# Patient Record
Sex: Male | Born: 1965 | Race: White | Hispanic: No | Marital: Single | Smoking: Former smoker
Health system: Southern US, Community
[De-identification: ages and names within clinical notes are randomized; demographics above are authoritative.]

## PROBLEM LIST (undated history)

## (undated) DIAGNOSIS — R569 Unspecified convulsions: Secondary | ICD-10-CM

## (undated) DIAGNOSIS — C801 Malignant (primary) neoplasm, unspecified: Secondary | ICD-10-CM

## (undated) HISTORY — PX: OTHER SURGICAL HISTORY: SHX169

---

## 2006-04-10 ENCOUNTER — Emergency Department: Payer: Self-pay | Admitting: Emergency Medicine

## 2017-06-02 DIAGNOSIS — Z01818 Encounter for other preprocedural examination: Secondary | ICD-10-CM

## 2018-05-25 DIAGNOSIS — Z01818 Encounter for other preprocedural examination: Secondary | ICD-10-CM

## 2020-04-17 ENCOUNTER — Emergency Department (HOSPITAL_COMMUNITY): Payer: Medicaid Other

## 2020-04-17 ENCOUNTER — Other Ambulatory Visit: Payer: Self-pay

## 2020-04-17 ENCOUNTER — Inpatient Hospital Stay (HOSPITAL_COMMUNITY)
Admission: EM | Admit: 2020-04-17 | Discharge: 2020-05-02 | DRG: 004 | Disposition: A | Discharge status: Home Health | Payer: Medicaid Other | Attending: Internal Medicine | Admitting: Internal Medicine

## 2020-04-17 ENCOUNTER — Inpatient Hospital Stay (HOSPITAL_COMMUNITY): Payer: Medicaid Other

## 2020-04-17 DIAGNOSIS — R634 Abnormal weight loss: Secondary | ICD-10-CM | POA: Diagnosis present

## 2020-04-17 DIAGNOSIS — R1310 Dysphagia, unspecified: Secondary | ICD-10-CM | POA: Diagnosis present

## 2020-04-17 DIAGNOSIS — Z930 Tracheostomy status: Secondary | ICD-10-CM

## 2020-04-17 DIAGNOSIS — J9601 Acute respiratory failure with hypoxia: Secondary | ICD-10-CM | POA: Diagnosis not present

## 2020-04-17 DIAGNOSIS — E46 Unspecified protein-calorie malnutrition: Secondary | ICD-10-CM | POA: Diagnosis not present

## 2020-04-17 DIAGNOSIS — F17200 Nicotine dependence, unspecified, uncomplicated: Secondary | ICD-10-CM | POA: Diagnosis present

## 2020-04-17 DIAGNOSIS — J387 Other diseases of larynx: Secondary | ICD-10-CM

## 2020-04-17 DIAGNOSIS — C329 Malignant neoplasm of larynx, unspecified: Secondary | ICD-10-CM | POA: Diagnosis present

## 2020-04-17 DIAGNOSIS — G9341 Metabolic encephalopathy: Secondary | ICD-10-CM | POA: Diagnosis present

## 2020-04-17 DIAGNOSIS — Z681 Body mass index (BMI) 19 or less, adult: Secondary | ICD-10-CM | POA: Diagnosis not present

## 2020-04-17 DIAGNOSIS — A419 Sepsis, unspecified organism: Principal | ICD-10-CM | POA: Diagnosis present

## 2020-04-17 DIAGNOSIS — F1010 Alcohol abuse, uncomplicated: Secondary | ICD-10-CM | POA: Diagnosis present

## 2020-04-17 DIAGNOSIS — R109 Unspecified abdominal pain: Secondary | ICD-10-CM

## 2020-04-17 DIAGNOSIS — K709 Alcoholic liver disease, unspecified: Secondary | ICD-10-CM | POA: Diagnosis present

## 2020-04-17 DIAGNOSIS — J189 Pneumonia, unspecified organism: Secondary | ICD-10-CM | POA: Diagnosis present

## 2020-04-17 DIAGNOSIS — E860 Dehydration: Secondary | ICD-10-CM | POA: Diagnosis present

## 2020-04-17 DIAGNOSIS — E871 Hypo-osmolality and hyponatremia: Secondary | ICD-10-CM | POA: Diagnosis present

## 2020-04-17 DIAGNOSIS — E8809 Other disorders of plasma-protein metabolism, not elsewhere classified: Secondary | ICD-10-CM | POA: Diagnosis present

## 2020-04-17 DIAGNOSIS — Z23 Encounter for immunization: Secondary | ICD-10-CM

## 2020-04-17 DIAGNOSIS — E876 Hypokalemia: Secondary | ICD-10-CM | POA: Diagnosis present

## 2020-04-17 DIAGNOSIS — E43 Unspecified severe protein-calorie malnutrition: Secondary | ICD-10-CM | POA: Diagnosis present

## 2020-04-17 DIAGNOSIS — K7030 Alcoholic cirrhosis of liver without ascites: Secondary | ICD-10-CM | POA: Diagnosis not present

## 2020-04-17 DIAGNOSIS — Z720 Tobacco use: Secondary | ICD-10-CM | POA: Diagnosis not present

## 2020-04-17 DIAGNOSIS — R6521 Severe sepsis with septic shock: Secondary | ICD-10-CM

## 2020-04-17 DIAGNOSIS — R64 Cachexia: Secondary | ICD-10-CM | POA: Diagnosis present

## 2020-04-17 DIAGNOSIS — Z20822 Contact with and (suspected) exposure to covid-19: Secondary | ICD-10-CM | POA: Diagnosis present

## 2020-04-17 DIAGNOSIS — J690 Pneumonitis due to inhalation of food and vomit: Secondary | ICD-10-CM | POA: Diagnosis present

## 2020-04-17 DIAGNOSIS — N179 Acute kidney failure, unspecified: Secondary | ICD-10-CM | POA: Diagnosis present

## 2020-04-17 HISTORY — DX: Unspecified convulsions: R56.9

## 2020-04-17 LAB — COMPREHENSIVE METABOLIC PANEL
ALT: 19 U/L (ref 0–44)
AST: 33 U/L (ref 15–41)
Albumin: 2.1 g/dL — ABNORMAL LOW (ref 3.5–5.0)
Alkaline Phosphatase: 73 U/L (ref 38–126)
Anion gap: 15 (ref 5–15)
BUN: 79 mg/dL — ABNORMAL HIGH (ref 6–20)
CO2: 31 mmol/L (ref 22–32)
Calcium: 7.5 mg/dL — ABNORMAL LOW (ref 8.9–10.3)
Chloride: 82 mmol/L — ABNORMAL LOW (ref 98–111)
Creatinine, Ser: 1.99 mg/dL — ABNORMAL HIGH (ref 0.61–1.24)
GFR, Estimated: 39 mL/min — ABNORMAL LOW (ref >60)
Glucose, Bld: 105 mg/dL — ABNORMAL HIGH (ref 70–99)
Potassium: 2.1 mmol/L — CL (ref 3.5–5.1)
Sodium: 128 mmol/L — ABNORMAL LOW (ref 135–145)
Total Bilirubin: 1.8 mg/dL — ABNORMAL HIGH (ref 0.3–1.2)
Total Protein: 6.1 g/dL — ABNORMAL LOW (ref 6.5–8.1)

## 2020-04-17 LAB — LACTIC ACID, PLASMA
Lactic Acid, Venous: 5 mmol/L (ref 0.5–1.9)
Lactic Acid, Venous: 3.4 mmol/L (ref 0.5–1.9)

## 2020-04-17 LAB — URINALYSIS, ROUTINE W REFLEX MICROSCOPIC
Bacteria, UA: NONE SEEN
Bilirubin Urine: NEGATIVE
Glucose, UA: NEGATIVE mg/dL
Ketones, ur: NEGATIVE mg/dL
Leukocytes,Ua: NEGATIVE
Nitrite: NEGATIVE
Protein, ur: NEGATIVE mg/dL
Specific Gravity, Urine: 1.014 (ref 1.005–1.030)
pH: 5 (ref 5.0–8.0)

## 2020-04-17 LAB — CBC WITH DIFFERENTIAL/PLATELET
Abs Immature Granulocytes: 0 10*3/uL (ref 0.00–0.07)
Band Neutrophils: 0 %
Basophils Absolute: 0 10*3/uL (ref 0.0–0.1)
Basophils Relative: 0 %
Blasts: 0 %
Eosinophils Absolute: 0.5 10*3/uL (ref 0.0–0.5)
Eosinophils Relative: 3 %
HCT: 46.5 % (ref 39.0–52.0)
Hemoglobin: 16.5 g/dL (ref 13.0–17.0)
Lymphocytes Relative: 3 %
Lymphs Abs: 0.5 10*3/uL — ABNORMAL LOW (ref 0.7–4.0)
MCH: 30.6 pg (ref 26.0–34.0)
MCHC: 35.5 g/dL (ref 30.0–36.0)
MCV: 86.3 fL (ref 80.0–100.0)
Metamyelocytes Relative: 0 %
Monocytes Absolute: 0.7 10*3/uL (ref 0.1–1.0)
Monocytes Relative: 4 %
Myelocytes: 0 %
Neutro Abs: 15.8 10*3/uL — ABNORMAL HIGH (ref 1.7–7.7)
Neutrophils Relative %: 90 %
Other: 0 %
Platelets: 244 10*3/uL (ref 150–400)
Promyelocytes Relative: 0 %
RBC: 5.39 MIL/uL (ref 4.22–5.81)
RDW: 11.5 % (ref 11.5–15.5)
WBC: 17.5 10*3/uL — ABNORMAL HIGH (ref 4.0–10.5)
nRBC: 0 % (ref 0.0–0.2)
nRBC: 0 /100 WBC

## 2020-04-17 LAB — PROTIME-INR
INR: 1.4 — ABNORMAL HIGH (ref 0.8–1.2)
Prothrombin Time: 16.4 seconds — ABNORMAL HIGH (ref 11.4–15.2)

## 2020-04-17 LAB — APTT: aPTT: 30 seconds (ref 24–36)

## 2020-04-17 LAB — RESP PANEL BY RT-PCR (FLU A&B, COVID) ARPGX2
Influenza A by PCR: NEGATIVE
Influenza B by PCR: NEGATIVE
SARS Coronavirus 2 by RT PCR: NEGATIVE

## 2020-04-17 LAB — TROPONIN I (HIGH SENSITIVITY)
Troponin I (High Sensitivity): 16 ng/L (ref <18)
Troponin I (High Sensitivity): 14 ng/L (ref <18)

## 2020-04-17 LAB — POC OCCULT BLOOD, ED: Fecal Occult Bld: NEGATIVE

## 2020-04-17 LAB — MAGNESIUM: Magnesium: 2.1 mg/dL (ref 1.7–2.4)

## 2020-04-17 MED ORDER — POTASSIUM CHLORIDE 10 MEQ/100ML IV SOLN
10.0000 meq | INTRAVENOUS | Status: AC
  Administered 2020-04-17 – 2020-04-18 (×4): 10 meq via INTRAVENOUS
  Filled 2020-04-17 (×5): qty 100

## 2020-04-17 MED ORDER — SODIUM CHLORIDE 0.9 % IV SOLN
2.0000 g | Freq: Two times a day (BID) | INTRAVENOUS | Status: DC
  Administered 2020-04-18: 2 g via INTRAVENOUS
  Filled 2020-04-17: qty 2

## 2020-04-17 MED ORDER — POTASSIUM CHLORIDE 10 MEQ/100ML IV SOLN
10.0000 meq | INTRAVENOUS | Status: AC
  Administered 2020-04-18 (×2): 10 meq via INTRAVENOUS
  Filled 2020-04-17: qty 100

## 2020-04-17 MED ORDER — POTASSIUM CHLORIDE CRYS ER 20 MEQ PO TBCR
40.0000 meq | EXTENDED_RELEASE_TABLET | Freq: Once | ORAL | Status: AC
  Administered 2020-04-17: 40 meq via ORAL
  Filled 2020-04-17: qty 2

## 2020-04-17 MED ORDER — SODIUM CHLORIDE 0.9 % IV SOLN
500.0000 mg | INTRAVENOUS | Status: DC
  Administered 2020-04-17 – 2020-04-21 (×5): 500 mg via INTRAVENOUS
  Filled 2020-04-17 (×7): qty 500

## 2020-04-17 MED ORDER — SODIUM CHLORIDE 0.9 % IV SOLN
2.0000 g | Freq: Once | INTRAVENOUS | Status: AC
  Administered 2020-04-17: 2 g via INTRAVENOUS
  Filled 2020-04-17: qty 2

## 2020-04-17 MED ORDER — VANCOMYCIN HCL 1000 MG/200ML IV SOLN
1000.0000 mg | Freq: Once | INTRAVENOUS | Status: AC
Start: 2020-04-17 — End: 2020-04-17
  Administered 2020-04-17: 1000 mg via INTRAVENOUS
  Filled 2020-04-17: qty 200

## 2020-04-17 MED ORDER — METRONIDAZOLE IN NACL 5-0.79 MG/ML-% IV SOLN
500.0000 mg | Freq: Once | INTRAVENOUS | Status: AC
  Administered 2020-04-17: 500 mg via INTRAVENOUS
  Filled 2020-04-17: qty 100

## 2020-04-17 MED ORDER — VANCOMYCIN VARIABLE DOSE PER UNSTABLE RENAL FUNCTION (PHARMACIST DOSING): Status: DC

## 2020-04-17 MED ORDER — THIAMINE HCL 100 MG/ML IJ SOLN
100.0000 mg | Freq: Once | INTRAMUSCULAR | Status: AC
  Administered 2020-04-17: 100 mg via INTRAVENOUS
  Filled 2020-04-17: qty 2

## 2020-04-17 MED ORDER — LACTATED RINGERS IV SOLN: INTRAVENOUS | Status: DC

## 2020-04-17 NOTE — ED Notes (Signed)
Gwyndolyn Saxon PA also was informed lab was unable to obtain second set of blood cultures. This Rn will try again.

## 2020-04-17 NOTE — ED Notes (Signed)
Verbal order from [place] PA to infuse 1L of LR wide open. Current bag of LR that is infusing: rate increased to 999 ml/hr

## 2020-04-17 NOTE — ED Notes (Signed)
#  2 L NS Boluses for very low bp.

## 2020-04-17 NOTE — ED Provider Notes (Signed)
[facility] EMERGENCY DEPARTMENT Provider Note   CSN: [id number] Arrival date & time: 04/17/20  1828     History Chief Complaint  Patient presents with  . Multiple-see triage note    Mario Peters is a 55 y.o. male.  HPI   Patient with no significant medical history presents via EMS due to inability to get out of bed, weight loss, and productive cough x4 months.  Patient endorses that he stopped drinking proxy 4 months ago as he has been unable to swallow without pain.  He endorses every time he swallows food or drinks liquid he has a burning sensation in his throat.  Patient patient has a history of alcohol abuse as well as smoking history and had to stop drinking due to the pain in his throat.  He denies any alleviating factors.  Patient admits to frequent coughs over the last 4 months but has progressed gotten worse, he admits to productive sputum, fevers and chills, without chest pain or shortness of breath.  Patient states that he has been stuck in his bed for few days as he feels too weak to get out of out, he endorses pain in his legs feels like electrical shock down his legs, he denies recent trauma to the area, paresthesias or weakness in them.   No past medical history on file.  There are no problems to display for this patient.       No family history on file.     Home Medications Prior to Admission medications   Not on File    Allergies    Patient has no known allergies.  Review of Systems   Review of Systems  Constitutional: Positive for chills and fever.  HENT: Positive for sore throat and trouble swallowing. Negative for congestion.   Eyes: Negative for visual disturbance.  Respiratory: Positive for cough. Negative for shortness of breath.   Cardiovascular: Negative for chest pain.  Gastrointestinal: Positive for abdominal pain and constipation. Negative for diarrhea and vomiting.  Genitourinary: Negative for enuresis and flank  pain.  Musculoskeletal: Negative for back pain.  Skin: Negative for rash.  Neurological: Positive for weakness. Negative for dizziness and headaches.  Hematological: Does not bruise/bleed easily.    Physical Exam Updated Vital Signs BP (!) 150/79 (BP Location: Right Arm)   Pulse 72   Temp 100 F (37.8 C) (Rectal)   Resp 13   Ht 5\' 7"  (1.702 m)   Wt 65.8 kg   SpO2 92%   BMI 22.71 kg/m   Physical Exam Vitals and nursing note reviewed.  Constitutional:      General: He is not in acute distress.    Appearance: He is ill-appearing and toxic-appearing.     Comments: Patient is noted to be in a deconditioned and emaciated, febrile, tachycardic, hypotensive.  HENT:     Head: Normocephalic and atraumatic.     Nose: No congestion.     Mouth/Throat:     Mouth: Mucous membranes are dry.     Pharynx: Oropharynx is clear. No oropharyngeal exudate or posterior oropharyngeal erythema.  Eyes:     Extraocular Movements: Extraocular movements intact.     Conjunctiva/sclera: Conjunctivae normal.     Pupils: Pupils are equal, round, and reactive to light.  Cardiovascular:     Rate and Rhythm: Regular rhythm. Tachycardia present.     Pulses: Normal pulses.     Heart sounds: No murmur heard. No friction rub. No gallop.   Pulmonary:  Effort: No respiratory distress.     Breath sounds: No wheezing, rhonchi or rales.     Comments: Patient is a nasal cannula at 3 L, satting in the low 90s, patient has noted rhonchi and decreased breath sounds in the lower lobes bilaterally, no Rales or wheezing noted Abdominal:     Palpations: Abdomen is soft.     Tenderness: There is no abdominal tenderness. There is no right CVA tenderness or left CVA tenderness.  Musculoskeletal:     Right lower leg: No edema.     Left lower leg: No edema.  Skin:    General: Skin is warm and dry.  Neurological:     Mental Status: He is alert.  Psychiatric:        Mood and Affect: Mood normal.     ED Results /  Procedures / Treatments   Labs (all labs ordered are listed, but only abnormal results are displayed) Labs Reviewed  LACTIC ACID, PLASMA - Abnormal; Notable for the following components:      Result Value   Lactic Acid, Venous 5.0 (*)    All other components within normal limits  COMPREHENSIVE METABOLIC PANEL - Abnormal; Notable for the following components:   Sodium 128 (*)    Potassium 2.1 (*)    Chloride 82 (*)    Glucose, Bld 105 (*)    BUN 79 (*)    Creatinine, Ser 1.99 (*)    Calcium 7.5 (*)    Total Protein 6.1 (*)    Albumin 2.1 (*)    Total Bilirubin 1.8 (*)    GFR, Estimated 39 (*)    All other components within normal limits  CBC WITH DIFFERENTIAL/PLATELET - Abnormal; Notable for the following components:   WBC 17.5 (*)    Neutro Abs 15.8 (*)    Lymphs Abs 0.5 (*)    All other components within normal limits  PROTIME-INR - Abnormal; Notable for the following components:   Prothrombin Time 16.4 (*)    INR 1.4 (*)    All other components within normal limits  URINALYSIS, ROUTINE W REFLEX MICROSCOPIC - Abnormal; Notable for the following components:   Color, Urine AMBER (*)    APPearance CLOUDY (*)    Hgb urine dipstick SMALL (*)    All other components within normal limits  RESP PANEL BY RT-PCR (FLU A&B, COVID) ARPGX2  CULTURE, BLOOD (ROUTINE X 2)  CULTURE, BLOOD (ROUTINE X 2)  URINE CULTURE  APTT  MAGNESIUM  LACTIC ACID, PLASMA  POC OCCULT BLOOD, ED  I-STAT CHEM 8, ED  TROPONIN I (HIGH SENSITIVITY)  TROPONIN I (HIGH SENSITIVITY)    EKG None  Radiology DG Chest Port 1 View  Result Date: 04/17/2020 CLINICAL DATA:  Weight loss cough EXAM: PORTABLE CHEST 1 VIEW COMPARISON:  None. FINDINGS: Hyperinflation. Ill-defined left greater than right reticulonodular and ground-glass opacity. No pleural effusion. Normal heart size. No pneumothorax. IMPRESSION: Ill-defined left greater than right reticulonodular and ground-glass opacity, suspicious for bilateral  pneumonia, potentially atypical or viral pneumonia. Electronically Signed   By: Donavan Foil M.D.   On: 04/17/2020 19:14    Procedures .Critical Care Performed by: Marcello Fennel, PA-C Authorized by: Marcello Fennel, PA-C   Critical care provider statement:    Critical care time (minutes):  45   Critical care time was exclusive of:  Separately billable procedures and treating other patients   Critical care was necessary to treat or prevent imminent or life-threatening deterioration of the following conditions:  Sepsis and metabolic crisis   Critical care was time spent personally by me on the following activities:  Discussions with consultants, evaluation of patient's response to treatment, examination of patient, ordering and performing treatments and interventions, ordering and review of laboratory studies, ordering and review of radiographic studies, pulse oximetry, re-evaluation of patient's condition, obtaining history from patient or surrogate and review of old charts   I assumed direction of critical care for this patient from another provider in my specialty: no       Medications Ordered in ED Medications  lactated ringers infusion ( Intravenous New Bag/Given 04/17/20 2233)  vancomycin (VANCOREADY) IVPB 1000 mg/200 mL (1,000 mg Intravenous New Bag/Given 04/17/20 2210)  potassium chloride 10 mEq in 100 mL IVPB (10 mEq Intravenous New Bag/Given 04/17/20 2220)  vancomycin variable dose per unstable renal function (pharmacist dosing) (has no administration in time range)  ceFEPIme (MAXIPIME) 2 g in sodium chloride 0.9 % 100 mL IVPB (has no administration in time range)  ceFEPIme (MAXIPIME) 2 g in sodium chloride 0.9 % 100 mL IVPB (0 g Intravenous Stopped 04/17/20 2101)  metroNIDAZOLE (FLAGYL) IVPB 500 mg (0 mg Intravenous Stopped 04/17/20 2209)  potassium chloride SA (KLOR-CON) CR tablet 40 mEq (40 mEq Oral Given 04/17/20 2221)  thiamine (B-1) injection 100 mg (100 mg Intravenous  Given 04/17/20 2239)    ED Course  I have reviewed the triage vital signs and the nursing notes.  Pertinent labs & imaging results that were available during my care of the patient were reviewed by me and considered in my medical decision making (see chart for details).    MDM Rules/Calculators/A&P                         Initial impression-patient presents with fatigue, cough, weight loss.  Appear to be emaciated, but alert, vital signs notable for febrile, tachycardic, hypotensive, currently on 3 L via nasal cannula.  Concern for sepsis we will start sepsis lab work-up, start him on fluids, antibiotics, and reassess.  Work-up-CBC notable for leukocytosis 17.5, CMP shows hyponatremia of 128, hyperkalemia 2.1, creatinine 1.99, T bili 1.8.  Prothrombin time 16.4, INR 1.4, lactic 5.  UA shows negative nitrites or leukocytes.  Chest x-ray shows ill-defined left greater than right reticulonodular node growth glass opacities source for bilateral pneumonia  Reassessment patient reassessed after providing him with fluids, blood pressure has improved, systolic in the 518A, heart rate has normalized in the mid 80s to 90s, currently not requiring oxygen at this time.  Patient is noted to have low potassium suspect  secondary due to malnutrition will start him on intravenous potassium was oral supplements.   Rule out-I have low suspicion for meningitis as there is no meningeal signs on my exam, low suspicion for CVA or intracranial head bleed as patient is no neuro deficits, patient is grossly moving all 4 extremities without difficulty.  Low suspicion for ACS as patient denies chest pain, shortness of breath, EKG negative for signs of ischemia.  Low suspicion for intra-abdominal abnormality patient denies abdominal pain, abdomen soft nontender palpation.   Plan-due to shift change patient will be handed off to Dr. Ralene Bathe she was provided HPI, current work-up, likely disposition.  Patient will be admitted to  medicine due to sepsis secondary to bilateral pneumonia.  Suspect patient's hypokalemia secondary to malnutrition and chronic alcohol abuse.    Final Clinical Impression(s) / ED Diagnoses Final diagnoses:  Sepsis, due to unspecified organism, unspecified whether  acute organ dysfunction present ([place])  Hypokalemia  AKI (acute kidney injury) [facility])    Rx / DC Orders ED Discharge Orders    None       Aron Baba 04/17/20 2246    Quintella Reichert, MD 04/23/20 0110

## 2020-04-17 NOTE — ED Notes (Signed)
Lab unable to draw second set of blood cultures.

## 2020-04-17 NOTE — Progress Notes (Signed)
Pharmacy Antibiotic Note  Mario Peters is a 55 y.o. male admitted on 04/17/2020 with sepsis.  Pharmacy has been consulted for vancomycin and cefepime dosing.  Plan: Vancomycin 1000 mg IV x1, then variable dosing due to unstable renal function Cefepime 2 g IV every 12 hours Monitor renal function, Cx and clinical progression to narrow Vancomycin levels as indicated  Height: 5\' 7"  (170.2 cm) Weight: 65.8 kg (145 lb) IBW/kg (Calculated) : 66.1  Temp (24hrs), Avg:98.8 F (37.1 C), Min:97.6 F (36.4 C), Max:100 F (37.8 C)  Recent Labs  Lab 04/17/20 1936  WBC 17.5*  CREATININE 1.99*  LATICACIDVEN 5.0*    Estimated Creatinine Clearance: 39.5 mL/min (A) (by C-G formula based on SCr of 1.99 mg/dL (H)).    No Known Allergies  Antimicrobials this admission: Vancomycin 3/30> Cefepime 3/30>  Dose adjustments this admission: N/A  Microbiology results: 3/30 Bcx: 3/30 Ucx:   Thank you for allowing pharmacy to be a part of this patient's care.  Bertis Ruddy, PharmD Clinical Pharmacist ED Pharmacist Phone # [phone] 04/17/2020 9:50 PM

## 2020-04-17 NOTE — ED Notes (Signed)
Second set of blood cultures obtained by this RN prior to Vancomycin being infused but cefepime and flagyl had already been infused

## 2020-04-17 NOTE — ED Notes (Signed)
K -2.1 and Lactic acid -5: Gwyndolyn Saxon, [place] informed

## 2020-04-17 NOTE — Sepsis Progress Note (Signed)
Monitoring for code sepsis protocol. 

## 2020-04-17 NOTE — ED Triage Notes (Addendum)
Pt bib ems from home reportedly has not been out of bed today. Reports abruptly stopped drinking alcohol "cold [place]" 4 mths ago. C/O weight loss, constipation, productive cough, dry mouth, dehydration, generalized pain. cbg [address] ST 130-105 rr 30, capnography 26

## 2020-04-17 NOTE — ED Notes (Signed)
Report given to Jade,RN.

## 2020-04-17 NOTE — H&P (Signed)
Mario Peters EXB:[id number] DOB: 09-22-1965 DOA: 04/17/2020     PCP: Patient, [name]   Outpatient Specialists: NONE    Patient arrived to ER on 04/17/20 at [facility] Referred by Attending Quintella Reichert, MD   Patient coming from: home Lives   With family    Chief Complaint:   Chief Complaint  Patient presents with  . Multiple-see triage note    HPI: Mario Peters is a 55 y.o. male with medical history significant of EtOH abuse,  Tobacco abuse, remote hx of seizures    Presented with  Quit drinking 4 months ago have had sever dysphasia, cough fatigue unable to get out of bed today and called 911  Patient continues to smoke, reports have had significant weight loss and has burning sensation sometimes when he swallows reports hoarseness of his voice has been chronic. Also states 3 to 4 years ago he had a seizure and was given a dose of Dilantin and he thought it made his seizure worse so he stopped taking it and he have not had seizures since. He has not had any chest pain but does have some significant shortness of breath   Infectious risk factors:  Reports shortness of breath,    Has  been vaccinated against COVID x1   Initial COVID TEST  NEGATIVE    Lab Results  Component Value Date   [place] NEGATIVE 04/17/2020     While in ER: Noted to be initially hypotensive given aggressive IV fluid rehydration if improved blood pressure was noted to have elevated lactic acid and chest x-ray worrisome for pneumonia started on broad-spectrum antibiotics and being admitted for sepsis secondary to pneumonia     ED Triage Vitals  Enc Vitals Group     BP 04/17/20 1829 (!) 66/58     Pulse Rate 04/17/20 1829 (!) 101     Resp 04/17/20 1829 (!) 24     Temp 04/17/20 1829 97.6 F (36.4 C)     Temp Source 04/17/20 1829 Oral     SpO2 04/17/20 1829 93 %     Weight 04/17/20 2132 145 lb (65.8 kg)     Height 04/17/20 1856 _0  (1.702 m)     Head Circumference --       Peak Flow --      Pain Score 04/17/20 1850 10     Pain Loc --      Pain Edu? --      Excl. in [place]? --   TMAX(24)@     _________________________________________ Significant initial  Findings: Abnormal Labs Reviewed  LACTIC ACID, PLASMA - Abnormal; Notable for the following components:      Result Value   Lactic Acid, Venous 5.0 (*)    All other components within normal limits  LACTIC ACID, PLASMA - Abnormal; Notable for the following components:   Lactic Acid, Venous 3.4 (*)    All other components within normal limits  COMPREHENSIVE METABOLIC PANEL - Abnormal; Notable for the following components:   Sodium 128 (*)    Potassium 2.1 (*)    Chloride 82 (*)    Glucose, Bld 105 (*)    BUN 79 (*)    Creatinine, Ser 1.99 (*)    Calcium 7.5 (*)    Total Protein 6.1 (*)    Albumin 2.1 (*)    Total Bilirubin 1.8 (*)    GFR, Estimated 39 (*)    All other components within normal limits  CBC WITH DIFFERENTIAL/PLATELET -  Abnormal; Notable for the following components:   WBC 17.5 (*)    Neutro Abs 15.8 (*)    Lymphs Abs 0.5 (*)    All other components within normal limits  PROTIME-INR - Abnormal; Notable for the following components:   Prothrombin Time 16.4 (*)    INR 1.4 (*)    All other components within normal limits  URINALYSIS, ROUTINE W REFLEX MICROSCOPIC - Abnormal; Notable for the following components:   Color, Urine AMBER (*)    APPearance CLOUDY (*)    Hgb urine dipstick SMALL (*)    All other components within normal limits  I-STAT BETA HCG BLOOD, ED ([facility], [facility], [facility] ONLY) - Abnormal; Notable for the following components:   I-stat hCG, quantitative 6.1 (*)    All other components within normal limits   ____________________________________________ Ordered   CXR - Ill-defined left greater than right reticulonodular and ground-glass opacity, suspicious for bilateral pneumonia, potentially atypical or viral pneumonia.     CT  chest - evidence of infiltrate question  aspiration  CT neck showing soft tissue mass worrisome for laryngeal carcinoma   _________________________ Troponin 16 ECG: Ordered Personally reviewed by me showing: HR : 89 Rhythm:   NSR,    nonspecific changes,  QTC 594 ____________________ This patient meets SIRS Criteria     The recent clinical data is shown below. Vitals:   04/17/20 2132 04/17/20 2145 04/17/20 2200 04/17/20 2215  BP:  103/76 (!) 88/70 (!) 150/79  Pulse:  78 76 72  Resp:  17 (!) 33 13  Temp:      TempSrc:      SpO2:  96% 96% 92%  Weight: 65.8 kg     Height: $Remove'5\' 7"'[id number]$  (1.702 m)        WBC     Component Value Date/Time   WBC 17.5 (H) 04/17/2020 1936   LYMPHSABS 0.5 (L) 04/17/2020 1936   MONOABS 0.7 04/17/2020 1936   EOSABS 0.5 04/17/2020 1936   BASOSABS 0.0 04/17/2020 1936    Lactic Acid, Venous    Component Value Date/Time   LATICACIDVEN 5.0 (HH) 04/17/2020 1936    Procalcitonin  Ordered Lactic Acid, Venous    Component Value Date/Time   LATICACIDVEN 3.4 (HH) 04/17/2020 2206      UA   no evidence of UTI      Urine analysis:    Component Value Date/Time   COLORURINE AMBER (A) 04/17/2020 2133   APPEARANCEUR CLOUDY (A) 04/17/2020 2133   LABSPEC 1.014 04/17/2020 2133   PHURINE 5.0 04/17/2020 2133   GLUCOSEU NEGATIVE 04/17/2020 2133   HGBUR SMALL (A) 04/17/2020 2133   BILIRUBINUR NEGATIVE 04/17/2020 2133   [place] NEGATIVE 04/17/2020 2133   PROTEINUR NEGATIVE 04/17/2020 2133   NITRITE NEGATIVE 04/17/2020 2133   LEUKOCYTESUR NEGATIVE 04/17/2020 2133     _______________________________________________ Hospitalist was called for admission for CAP and sepsis with new laryngeal mass  The following Work up has been ordered so far:  Orders Placed This Encounter  Procedures  . Critical Care  . Resp Panel by RT-PCR (Flu A&B, Covid) Nasopharyngeal Swab  . Blood Culture (routine x 2)  . Urine culture  . DG Chest Port 1 View  . Lactic acid, plasma  . Comprehensive metabolic panel  .  CBC WITH DIFFERENTIAL  . Protime-INR  . APTT  . Urinalysis, Routine w reflex microscopic  . Magnesium  . Diet NPO time specified  . Cardiac monitoring  . Document height and weight  . Assess and Document  Glasgow Coma Scale  . Document vital signs within 1-hour of fluid bolus completion. Notify provider of abnormal vital signs despite fluid resuscitation.  . DO NOT delay antibiotics if unable to obtain blood culture.  . Refer to Sidebar Report: Sepsis Sidebar ED/IP  . Notify provider for difficulties obtaining IV access.  . Insert peripheral IV x 2  . Initiate Carrier Fluid Protocol  . Code Sepsis activation.  This occurs automatically when order is signed and prioritizes pharmacy, lab, and radiology services for STAT collections and interventions.  If [facility] downtime, call [facility] [phone]) to activate Code Sepsis.  Marland Kitchen ceFEPime (MAXIPIME) per pharmacy consult  . vancomycin per pharmacy consult  . Consult for [facility] Admission  . Pulse oximetry, continuous  . POC occult blood, ED  . I-stat chem 8, ED (not at [facility] or [facility])  . ED EKG 12-Lead  . EKG 12-Lead    Following Medications were ordered in ER: Medications  lactated ringers infusion ( Intravenous New Bag/Given 04/17/20 2233)  vancomycin (VANCOREADY) IVPB 1000 mg/200 mL (1,000 mg Intravenous New Bag/Given 04/17/20 2210)  potassium chloride 10 mEq in 100 mL IVPB (10 mEq Intravenous New Bag/Given 04/17/20 2220)  vancomycin variable dose per unstable renal function (pharmacist dosing) (has no administration in time range)  ceFEPIme (MAXIPIME) 2 g in sodium chloride 0.9 % 100 mL IVPB (has no administration in time range)  ceFEPIme (MAXIPIME) 2 g in sodium chloride 0.9 % 100 mL IVPB (0 g Intravenous Stopped 04/17/20 2101)  metroNIDAZOLE (FLAGYL) IVPB 500 mg (0 mg Intravenous Stopped 04/17/20 2209)  potassium chloride SA (KLOR-CON) CR tablet 40 mEq (40 mEq Oral Given 04/17/20 2221)  thiamine (B-1) injection 100 mg (100  mg Intravenous Given 04/17/20 2239)      OTHER Significant initial  Findings:  labs showing:    Recent Labs  Lab 04/17/20 1936  NA 128*  K 2.1*  CO2 31  GLUCOSE 105*  BUN 79*  CREATININE 1.99*  CALCIUM 7.5*  MG 2.1    Cr   ,  Up from baseline see below Lab Results  Component Value Date   CREATININE 1.99 (H) 04/17/2020    Recent Labs  Lab 04/17/20 1936  AST 33  ALT 19  ALKPHOS 73  BILITOT 1.8*  PROT 6.1*  ALBUMIN 2.1*   Lab Results  Component Value Date   CALCIUM 7.5 (L) 04/17/2020          Plt: Lab Results  Component Value Date   PLT 244 04/17/2020       Recent Labs  Lab 04/17/20 1936  WBC 17.5*  NEUTROABS 15.8*  HGB 16.5  HCT 46.5  MCV 86.3  PLT 244    HG/HCT  Stable,     Component Value Date/Time   HGB 16.5 04/17/2020 1936   HCT 46.5 04/17/2020 1936   MCV 86.3 04/17/2020 1936    No results for input(s): LIPASE, AMYLASE in the last 168 hours. Recent Labs  Lab 04/17/20 2357  AMMONIA 25     Cardiac Panel (last 3 results) No results for input(s): CKTOTAL, CKMB, TROPONINI, RELINDX in the last 72 hours.           Cultures: No results found for: SDES, SPECREQUEST, CULT, REPTSTATUS   Radiological Exams on Admission: CT SOFT TISSUE NECK WO CONTRAST  Result Date: 04/18/2020 CLINICAL DATA:  Fatigue, cough and weakness EXAM: CT NECK WITHOUT CONTRAST TECHNIQUE: Multidetector CT imaging of the neck was performed following the standard protocol without intravenous contrast. COMPARISON:  None. FINDINGS: Pharynx and larynx: There is a mildly hyperdense focus at the posterior aspect of the left vocal fold with associated soft tissue thickening (3:95). The epiglottis is normal. Pharynx is normal. Salivary glands: No inflammation, mass, or stone. Thyroid: Normal Lymph nodes: None enlarged or abnormal density. Vascular: Negative. Limited intracranial: Negative. Visualized orbits: Negative. Mastoids and visualized paranasal sinuses: Mild left maxillary  mucosal disease. Skeleton: No acute or aggressive process. Upper chest: Hazy opacities in the left apex. Mild emphysema. Other: None. IMPRESSION: 1. Mildly hyperdense focus at the posterior aspect of the left vocal fold with associated soft tissue thickening. 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 Correlation with direct visualization is recommended. 2. Hazy opacities in the left apex, which may indicate infection. Emphysema (ICD10-J43.9). Electronically Signed   By: Ulyses Jarred M.D.   On: 04/18/2020 00:28   CT CHEST WO CONTRAST  Result Date: 04/18/2020 CLINICAL DATA:  Follow-up abnormal chest x-ray EXAM: CT CHEST WITHOUT CONTRAST TECHNIQUE: Multidetector CT imaging of the chest was performed following the standard protocol without IV contrast. COMPARISON:  Chest x-ray from the previous day. FINDINGS: Cardiovascular: Somewhat limited due to lack of IV contrast. Mild atherosclerotic calcifications are noted in the thoracic aorta. No aneurysmal dilatation is noted. No cardiac enlargement is seen. Mild coronary calcifications are noted. The pulmonary artery as visualized appears within normal limits. Mediastinum/Nodes: Thoracic inlet shows some mild fullness in the region of the vocal cords better evaluated on recent CT of the neck. Thyroid is within normal limits. Scattered small mediastinal lymph nodes are noted. The esophagus is within normal limits. Lungs/Pleura: Lungs are well aerated bilaterally with a combination of fine ground-glass opacities as well as more nodular tree-in-bud changes throughout the lower lobes left greater than right. These changes are consistent with diffuse pneumonia. There are worsened in the interval from the prior chest x-ray. Possibility of superimposed aspiration would deserve consideration as well as there is considerable inspissated material within the bronchial tree particularly in the left lower lobe. Upper Abdomen: Visualized upper abdomen shows no acute abnormality.  Calcification along the margin of the spleen is noted. This may be related to prior trauma. Musculoskeletal: Degenerative changes of the thoracic spine are noted. IMPRESSION: Diffuse bilateral pulmonary parenchymal changes consistent with multifocal pneumonia. Possibility of superimposed aspiration deserves consideration as well given inspissated material within the lower lobe bronchial tree particularly on the left. Correlate clinically as to any immunocompromised state. Fullness in the region of the vocal cords incompletely evaluated on this exam. Aortic Atherosclerosis (ICD10-I70.0). Electronically Signed   By: Inez Catalina M.D.   On: 04/18/2020 00:20   DG Chest Port 1 View  Result Date: 04/17/2020 CLINICAL DATA:  Weight loss cough EXAM: PORTABLE CHEST 1 VIEW COMPARISON:  None. FINDINGS: Hyperinflation. Ill-defined left greater than right reticulonodular and ground-glass opacity. No pleural effusion. Normal heart size. No pneumothorax. IMPRESSION: Ill-defined left greater than right reticulonodular and ground-glass opacity, suspicious for bilateral pneumonia, potentially atypical or viral pneumonia. Electronically Signed   By: Donavan Foil M.D.   On: 04/17/2020 19:14   _______________________________________________________________________________________________________ Latest  Blood pressure (!) 150/79, pulse 72, temperature 100 F (37.8 C), temperature source Rectal, resp. rate 13, height _0  (1.702 m), weight 65.8 kg, SpO2 92 %.   Review of Systems:    Pertinent positives include:  shortness of breath at rest.  dyspnea on exertion, productive cough, change in color of mucus. Constitutional:  No weight loss, night sweats, Fevers, chills, fatigue, weight loss  HEENT:  No headaches, Difficulty swallowing,Tooth/dental problems,Sore throat,  No sneezing, itching, ear ache, nasal congestion, post nasal drip,  Cardio-vascular:  No chest pain, Orthopnea, PND, anasarca, dizziness,  palpitations.no Bilateral lower extremity swelling  GI:  No heartburn, indigestion, abdominal pain, nausea, vomiting, diarrhea, change in bowel habits, loss of appetite, melena, blood in stool, hematemesis Resp:  n No excess mucus, no No non-productive cough, No coughing up of blood.No No wheezing. Skin:  no rash or lesions. No jaundice GU:  no dysuria, change in color of urine, no urgency or frequency. No straining to urinate.  No flank pain.  Musculoskeletal:  No joint pain or no joint swelling. No decreased range of motion. No back pain.  Psych:  No change in mood or affect. No depression or anxiety. No memory loss.  Neuro: no localizing neurological complaints, no tingling, no weakness, no double vision, no gait abnormality, no slurred speech, no confusion  All systems reviewed and apart from [place] all are negative _______________________________________________________________________________________________ Past Medical History:   Past Medical History:  Diagnosis Date  . Seizures ([place])       History reviewed. No pertinent surgical history.  Social History:  Ambulatory   independently       reports that he has been smoking. He has never used smokeless tobacco. He reports previous alcohol use. He reports that he does not use drugs.     Family History:   Family History  Problem Relation Age of Onset  . Hypertension Other    ______________________________________________________________________________________________ Allergies: No Known Allergies   Prior to Admission medications   Not on File    ___________________________________________________________________________________________________ Physical Exam: Vitals with BMI 04/17/2020 04/17/2020 04/17/2020  Height - - -  Weight - - -  BMI - - -  Systolic 361 88 443  Diastolic 79 70 76  Pulse 72 76 78     1. General:  in No  Acute distress   Chronically ill cachectic  -appearing 2. Psychological: Alert  and  Oriented 3. Head/ENT:    Dry Mucous Membranes                          Head Non traumatic, neck supple                          Poor Dentition                           Fullness of the neck noted 4. SKIN: normal   decreased Skin turgor,  Skin clean Dry and intact no rash wide spread spider angioma 5. Heart: Regular rate and rhythm no  Murmur, no Rub or gallop 6. Lungs:   some wheezes and crackles   7. Abdomen: Soft,  non-tender, Non distended  bowel sounds present 8. Lower extremities: no clubbing, cyanosis, no edema 9. Neurologically Grossly intact, moving all 4 extremities equally   10. MSK: Normal range of motion    Chart has been reviewed  ______________________________________________________________________________________________  Assessment/Plan   55 y.o. 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 Sepsis ([place]) -   -SIRS criteria met with  elevated white blood cell count,       Component Value Date/Time   WBC 17.5 (H) 04/17/2020 1936   LYMPHSABS 0.5 (L)  04/17/2020 1936    RR >20 Today's Vitals   04/17/20 2300 04/17/20 2345 04/18/20 0045 04/18/20 0130  BP: 111/75 90/68  102/74  Pulse: 69 80 62 68  Resp: (!) 22 (!) _0 Temp:      TempSrc:      SpO2: 100% 100% 100% 99%  Weight:      Height:      PainSc:         This patient meets SIRS Criteria    -Most likely source being:  Pulmonary,    Patient meeting criteria for Severe sepsis with    evidence of end organ damage/organ dysfunction such as   elevated lactic acid >2     Component Value Date/Time   LATICACIDVEN 3.4 (HH) 04/17/2020 8882    acute metabolic encephalopathy  CMK<34 mmhg or MAP < 65 mmhg,   Acute hypoxia requiring new supplemental oxygen, SpO2: 99 % O2 Flow Rate (L/min): 3 L/min     Patient is met criteria for SEPTIC SHOCK initially with   lactic acid > 4 and multiple SBP readings below 90 mmhg or MAP reading below 65 mmhg   - Obtain serial lactic acid and procalcitonin level.  - Initiated IV antibiotics   - await results of blood and urine culture  - Rehydrate aggressively       30cc/kg fluid Blood pressure improving with IV fluid rehydration lactic acid is improving as well 1:42 AM  . AKI (acute kidney injury) ([place]) -will rehydrate obtain urine electrolytes  . Dehydration -will rehydrate  . CAP (community acquired pneumonia) -continue cefepime and azithromycin given a dose of vancomycin assess for any risk for aspiration   . Laryngeal mass -CT scan neck noted.  Will likely benefit from ENT consult and follow-up set up  . Hyponatremia -obtain urine electrolytes TSH  . 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 Hypokalemia - - will replace and repeat in AM,  check magnesium level and replace as needed  . Malnutrition ([place]) order prealbumin and nutritional consult  . Tobacco abuse -  Spoke about importance of quitting spent 5 minutes discussing options for treatment, prior attempts at quitting, and dangers of smoking  -At this point patient is   NOT  interested in quitting  - order nicotine patch   - nursing tobacco cessation protocol   Hx of EtOH abuse - pt quit 4 month ago currently not showing signs of withdrawal at this time Given physical exam /labs worrisome for signs of cirrhosis will order RUQ [place]  Other plan as per orders.  QT prolongation - - will monitor on tele avoid QT prolonging medications, rehydrate correct electrolytes   DVT prophylaxis:  SCD    Code Status:    Code Status: Not on file FULL CODE  as per patient   I had personally discussed CODE STATUS with patient      Family Communication:   Family not at  Bedside   Disposition Plan:      To home once workup is complete and patient is stable   Following barriers for discharge:                             Electrolytes corrected  count improving able to transition to PO antibiotics                             Will need to be able to tolerate PO                            Will likely need home health,                             Will need consultants to evaluate patient prior to discharge                      Would benefit from PT/OT eval prior to DC  Ordered                   Swallow eval - SLP ordered                                      Transition of care consulted                   Nutrition    consulted         Consults called: none will need to see ENT and establish care for 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  Admit to Progressive based on following criteria: RESPIRATORY PROBLEMS hypoxemic/hypercapnic respiratory failure that is responsive to NIPPV (BiPAP) or High Flow Nasal Cannula (6-80 lpm). Frequent assessment/intervention, no > Q2 hrs < Q4 hrs, to maintain oxygenation and pulmonary hygiene.  May admit patient to [facility] or [facility] if equivalent level of care is available:: No  Covid Evaluation: Confirmed COVID Negative  Diagnosis: Sepsis [facility]) [[id number]]  Admitting Physician: Toy Baker [[id number]]  Attending Physician: Toy Baker [[id number]]  Estimated length of stay: past midnight tomorrow  Certification:: I certify this patient will need inpatient services for at least 2 midnights          inpatient     I Expect 2 midnight stay secondary to severity of patient's current illness need for inpatient interventions justified by the  following:  hemodynamic instability despite optimal treatment (tachycardia tachypnea )  Severe lab/radiological/exam abnormalities including:  CAP   and extensive comorbidities including:  substance abuse  .    That are currently affecting medical management.   I expect  patient to be hospitalized for 2 midnights requiring inpatient medical care.  Patient is at high risk for adverse outcome (such as loss of life or disability) if not treated.  Indication for inpatient stay as follows:    Hemodynamic instability despite maximal medical therapy,    inability to maintain oral hydration     Need for IV antibiotics, IV fluids,     Level of care       SDU tele indefinitely please discontinue once patient no longer qualifies COVID-19 Labs    Lab Results  Component Value Date   [place] NEGATIVE 04/17/2020     Precautions: admitted  as Covid Negative      PPE: Used by the provider:   N95  eye Goggles,  Gloves     Luda Charbonneau 04/18/2020, 1:51 AM    [facility]     after 2 AM please page floor coverage PA If 7AM-7PM, please contact the day team taking care of the patient using [email or web address]   Patient was evaluated in the context of the global COVID-19 pandemic, which necessitated consideration that the patient might be at risk for infection with the SARS-CoV-2 virus that causes COVID-19. Institutional protocols and algorithms that pertain to the evaluation of patients at risk for COVID-19 are in a state of rapid change based on information released by regulatory bodies including the CDC and federal and state organizations. These policies and algorithms were followed during the patient's care.

## 2020-04-17 NOTE — Progress Notes (Incomplete)
Pharmacy Antibiotic Note  Mario Peters is a 55 y.o. male admitted on 04/17/2020 with sepsis.  Pharmacy has been consulted for vancomycin and cefepime dosing.  Plan: Vancomycin 1000 mg IV x1 Cefepime 2 g IV x1 Monitor renal function, C&S, clinical status, vanc levels as indicated  Height: 5\' 7"  (170.2 cm) IBW/kg (Calculated) : 66.1  Temp (24hrs), Avg:97.6 F (36.4 C), Min:97.6 F (36.4 C), Max:97.6 F (36.4 C)  No results for input(s): WBC, CREATININE, LATICACIDVEN, VANCOTROUGH, VANCOPEAK, VANCORANDOM, GENTTROUGH, GENTPEAK, GENTRANDOM, TOBRATROUGH, TOBRAPEAK, TOBRARND, AMIKACINPEAK, AMIKACINTROU, AMIKACIN in the last 168 hours.  CrCl cannot be calculated (No successful lab value found.).    No Known Allergies  Antimicrobials this admission: Vancomycin 3/30> Cefepime 3/30>  Dose adjustments this admission: N/A  Microbiology results: 3/30 Bcx: 3/30 Ucx:   Thank you for allowing pharmacy to be a part of this patient's care.  Romilda Garret, PharmD PGY1 Acute Care Pharmacy Resident 04/17/2020 7:09 PM  Please check [email or web address] for unit specific pharmacy phone numbers.

## 2020-04-18 ENCOUNTER — Encounter (HOSPITAL_COMMUNITY): Payer: Self-pay | Admitting: Internal Medicine

## 2020-04-18 ENCOUNTER — Inpatient Hospital Stay (HOSPITAL_COMMUNITY): Payer: Medicaid Other

## 2020-04-18 DIAGNOSIS — E876 Hypokalemia: Secondary | ICD-10-CM | POA: Diagnosis not present

## 2020-04-18 DIAGNOSIS — N179 Acute kidney failure, unspecified: Secondary | ICD-10-CM | POA: Diagnosis not present

## 2020-04-18 DIAGNOSIS — F1010 Alcohol abuse, uncomplicated: Secondary | ICD-10-CM | POA: Diagnosis not present

## 2020-04-18 DIAGNOSIS — R634 Abnormal weight loss: Secondary | ICD-10-CM | POA: Diagnosis present

## 2020-04-18 DIAGNOSIS — J189 Pneumonia, unspecified organism: Secondary | ICD-10-CM | POA: Diagnosis present

## 2020-04-18 DIAGNOSIS — A419 Sepsis, unspecified organism: Secondary | ICD-10-CM | POA: Diagnosis not present

## 2020-04-18 DIAGNOSIS — J387 Other diseases of larynx: Secondary | ICD-10-CM | POA: Diagnosis present

## 2020-04-18 DIAGNOSIS — E871 Hypo-osmolality and hyponatremia: Secondary | ICD-10-CM | POA: Diagnosis present

## 2020-04-18 DIAGNOSIS — Z720 Tobacco use: Secondary | ICD-10-CM | POA: Diagnosis present

## 2020-04-18 DIAGNOSIS — E860 Dehydration: Secondary | ICD-10-CM | POA: Diagnosis present

## 2020-04-18 DIAGNOSIS — E46 Unspecified protein-calorie malnutrition: Secondary | ICD-10-CM | POA: Diagnosis present

## 2020-04-18 LAB — COMPREHENSIVE METABOLIC PANEL
ALT: 20 U/L (ref 0–44)
AST: 31 U/L (ref 15–41)
Albumin: 1.7 g/dL — ABNORMAL LOW (ref 3.5–5.0)
Alkaline Phosphatase: 63 U/L (ref 38–126)
Anion gap: 9 (ref 5–15)
BUN: 63 mg/dL — ABNORMAL HIGH (ref 6–20)
CO2: 34 mmol/L — ABNORMAL HIGH (ref 22–32)
Calcium: 8.1 mg/dL — ABNORMAL LOW (ref 8.9–10.3)
Chloride: 86 mmol/L — ABNORMAL LOW (ref 98–111)
Creatinine, Ser: 1.11 mg/dL (ref 0.61–1.24)
GFR, Estimated: >60 mL/min (ref >60)
Glucose, Bld: 98 mg/dL (ref 70–99)
Potassium: 2.9 mmol/L — ABNORMAL LOW (ref 3.5–5.1)
Sodium: 129 mmol/L — ABNORMAL LOW (ref 135–145)
Total Bilirubin: 1.3 mg/dL — ABNORMAL HIGH (ref 0.3–1.2)
Total Protein: 5.2 g/dL — ABNORMAL LOW (ref 6.5–8.1)

## 2020-04-18 LAB — I-STAT ARTERIAL BLOOD GAS, ED
Acid-Base Excess: 4 mmol/L — ABNORMAL HIGH (ref 0.0–2.0)
Bicarbonate: 28.6 mmol/L — ABNORMAL HIGH (ref 20.0–28.0)
Calcium, Ion: 1.17 mmol/L (ref 1.15–1.40)
Comment: 300
HCT: 43 % (ref 39.0–52.0)
Hemoglobin: 14.6 g/dL (ref 13.0–17.0)
O2 Saturation: 63 %
Potassium: 3.4 mmol/L — ABNORMAL LOW (ref 3.5–5.1)
Sodium: 139 mmol/L (ref 135–145)
TCO2: 30 mmol/L (ref 22–32)
pCO2 arterial: 41.1 mmHg (ref 32.0–48.0)
pH, Arterial: 7.45 (ref 7.350–7.450)
pO2, Arterial: 31 mmHg — CL (ref 83.0–108.0)

## 2020-04-18 LAB — OSMOLALITY: Osmolality: 290 mOsm/kg (ref 275–295)

## 2020-04-18 LAB — LACTIC ACID, PLASMA
Lactic Acid, Venous: 1.3 mmol/L (ref 0.5–1.9)
Lactic Acid, Venous: 1.6 mmol/L (ref 0.5–1.9)

## 2020-04-18 LAB — PREALBUMIN: Prealbumin: <5 mg/dL — ABNORMAL LOW (ref 18–38)

## 2020-04-18 LAB — I-STAT BETA HCG BLOOD, ED (MC, WL, AP ONLY): I-stat hCG, quantitative: 6.1 m[IU]/mL — ABNORMAL HIGH (ref <5)

## 2020-04-18 LAB — AMMONIA: Ammonia: 25 umol/L (ref 9–35)

## 2020-04-18 LAB — SODIUM, URINE, RANDOM: Sodium, Ur: <10 mmol/L

## 2020-04-18 LAB — PHOSPHORUS: Phosphorus: 2.1 mg/dL — ABNORMAL LOW (ref 2.5–4.6)

## 2020-04-18 LAB — OSMOLALITY, URINE: Osmolality, Ur: 468 mOsm/kg (ref 300–900)

## 2020-04-18 LAB — CREATININE, URINE, RANDOM: Creatinine, Urine: 70.96 mg/dL

## 2020-04-18 LAB — APTT: aPTT: 25 seconds (ref 24–36)

## 2020-04-18 LAB — CK: Total CK: 117 U/L (ref 49–397)

## 2020-04-18 MED ORDER — ENSURE ENLIVE PO LIQD: 237.0000 mL | Freq: Two times a day (BID) | ORAL | Status: DC

## 2020-04-18 MED ORDER — OXYMETAZOLINE HCL 0.05 % NA SOLN
1.0000 | Freq: Once | NASAL | Status: DC | PRN
  Filled 2020-04-18: qty 15

## 2020-04-18 MED ORDER — SILVER NITRATE-POT NITRATE 75-25 % EX MISC
1.0000 | Freq: Once | CUTANEOUS | Status: DC | PRN
  Filled 2020-04-18: qty 10

## 2020-04-18 MED ORDER — LIDOCAINE HCL 4 % EX SOLN
0.0000 mL | Freq: Once | CUTANEOUS | Status: DC | PRN
  Filled 2020-04-18: qty 50

## 2020-04-18 MED ORDER — LACTATED RINGERS IV SOLN: INTRAVENOUS | Status: DC

## 2020-04-18 MED ORDER — NICOTINE 21 MG/24HR TD PT24
21.0000 mg | MEDICATED_PATCH | Freq: Every day | TRANSDERMAL | Status: DC
  Administered 2020-04-18 – 2020-05-02 (×15): 21 mg via TRANSDERMAL
  Filled 2020-04-18 (×15): qty 1

## 2020-04-18 MED ORDER — TRIPLE ANTIBIOTIC 3.5-400-5000 EX OINT
1.0000 "application " | TOPICAL_OINTMENT | Freq: Once | CUTANEOUS | Status: DC | PRN
  Filled 2020-04-18: qty 1

## 2020-04-18 MED ORDER — LIDOCAINE-EPINEPHRINE (PF) 1 %-1:200000 IJ SOLN
0.0000 mL | Freq: Once | INTRAMUSCULAR | Status: DC | PRN
  Filled 2020-04-18 (×2): qty 30

## 2020-04-18 MED ORDER — LIDOCAINE HCL 2 % EX GEL
1.0000 "application " | Freq: Once | CUTANEOUS | Status: DC | PRN
  Filled 2020-04-18: qty 4250

## 2020-04-18 MED ORDER — PNEUMOCOCCAL VAC POLYVALENT 25 MCG/0.5ML IJ INJ
0.5000 mL | INJECTION | INTRAMUSCULAR | Status: AC | PRN
  Administered 2020-04-19: 0.5 mL via INTRAMUSCULAR
  Filled 2020-04-18: qty 0.5

## 2020-04-18 MED ORDER — POLYETHYLENE GLYCOL 3350 17 G PO PACK
17.0000 g | PACK | Freq: Every day | ORAL | Status: DC
  Administered 2020-04-18 – 2020-04-19 (×2): 17 g via ORAL
  Filled 2020-04-18 (×3): qty 1

## 2020-04-18 MED ORDER — POTASSIUM CHLORIDE CRYS ER 20 MEQ PO TBCR
40.0000 meq | EXTENDED_RELEASE_TABLET | Freq: Once | ORAL | Status: AC
  Administered 2020-04-18: 40 meq via ORAL
  Filled 2020-04-18: qty 2

## 2020-04-18 MED ORDER — VANCOMYCIN HCL 750 MG/150ML IV SOLN
750.0000 mg | Freq: Two times a day (BID) | INTRAVENOUS | Status: DC
  Filled 2020-04-18: qty 150

## 2020-04-18 MED ORDER — ENOXAPARIN SODIUM 40 MG/0.4ML ~~LOC~~ SOLN
40.0000 mg | Freq: Every day | SUBCUTANEOUS | Status: DC
  Administered 2020-04-18 – 2020-05-02 (×13): 40 mg via SUBCUTANEOUS
  Filled 2020-04-18 (×13): qty 0.4

## 2020-04-18 MED ORDER — SODIUM CHLORIDE 0.9 % IV SOLN
2.0000 g | Freq: Three times a day (TID) | INTRAVENOUS | Status: AC
  Administered 2020-04-18 – 2020-04-23 (×15): 2 g via INTRAVENOUS
  Filled 2020-04-18 (×17): qty 2

## 2020-04-18 NOTE — ED Notes (Signed)
Labs drawn and sent at this time

## 2020-04-18 NOTE — Plan of Care (Signed)

## 2020-04-18 NOTE — Progress Notes (Signed)
Pharmacy Antibiotic Note  Mario Peters is a 55 y.o. male admitted on 04/17/2020 with sepsis.  Pharmacy has been consulted for vancomycin and cefepime dosing.  WBC 17.5. SCr has normalized this AM. AF. Cx negative so far   Plan: Start vancomycin 750 mg IV Q 12 hours Increase Cefepime to 2 g IV every 8 hours Monitor renal function, Cx and clinical progression to narrow Vancomycin levels as indicated  Height: 5\' 7"  (170.2 cm) Weight: 65.8 kg (145 lb) IBW/kg (Calculated) : 66.1  Temp (24hrs), Avg:98.5 F (36.9 C), Min:97.6 F (36.4 C), Max:100 F (37.8 C)  Recent Labs  Lab 04/17/20 1936 04/17/20 2206 04/18/20 0100 04/18/20 0300 04/18/20 0700  WBC 17.5*  --   --   --   --   CREATININE 1.99*  --   --   --  1.11  LATICACIDVEN 5.0* 3.4* 1.3 1.6  --     Estimated Creatinine Clearance: 70.8 mL/min (by C-G formula based on SCr of 1.11 mg/dL).    No Known Allergies  Antimicrobials this admission: Vancomycin 3/30> Cefepime 3/30>  Dose adjustments this admission: N/A  Microbiology results: 3/30 Bcx: 3/30 Ucx:   Thank you for allowing pharmacy to be a part of this patient's care.  Albertina Parr, PharmD., BCPS, BCCCP Clinical Pharmacist Please refer to [facility] for unit-specific pharmacist

## 2020-04-18 NOTE — Progress Notes (Addendum)
PROGRESS NOTE    Mario Peters  YBW:[id number] DOB: [birth year] DOA: 04/17/2020 PCP: Patient, [name]  Brief Narrative: 55 year old male with history of alcohol and tobacco abuse, very poor historian presented to the ED with multiple complaints including weakness, ongoing dysphagia with very poor p.o. intake for 4 months, ongoing weight loss, hoarseness, last 1 to 2 weeks please had worsening cough, last couple of days has been so weak that he was unable to get out of bed.  In the ER he was noted to be hypertensive with blood pressures in the 70s which improved with fluid resuscitation, also noted to be hypoxic requiring supplemental O2. -CT chest noted bilateral pneumonia, CT soft tissue neck noted a hyperdense focus at the posterior aspect of the left vocal cord with soft tissue swelling raising concern for laryngeal CA   Assessment & Plan:   Severe Sepsis Aspiration pneumonia -Improving, blood pressure is more stable -continue cefepime, will stop vancomycin -Follow-up blood cultures -Continue IV fluids today -SLP evaluation  Acute kidney injury -Resolved with hydration  Laryngeal mass Severe dysphagia -High suspicion for malignancy with ongoing dysphagia, hoarseness, weight loss etc. and long history of tobacco abuse -Will request ENT input -SLP eval today  Tobacco abuse -Counseled  Alcohol abuse Alcoholic liver disease -Mildly elevated bilirubin, low albumin -Ultrasound does not indicate cirrhosis yet however does have spider nevi on exam  Severe protein calorie malnutrition -Albumin is 1.7, could be secondary to ongoing weight loss from dysphagia, possible malignancy, alcoholic liver disease also contributing  DVT prophylaxis:lovenox Code Status: Full Code Family Communication: none at bedside Disposition Plan:  Status is: Inpatient  Remains inpatient appropriate because:Inpatient level of care appropriate due to severity of illness   Dispo: The  patient is from: Home              Anticipated d/c is to: Home              Patient currently is not medically stable to d/c.   Difficult to place patient No  Consultants:   ENT, pending   Procedures:   Antimicrobials:    Subjective: -Asked for food, complains of hoarseness and trouble swallowing for 4 months, reports profound weight loss  Objective: Vitals:   04/18/20 0736 04/18/20 0745 04/18/20 0900 04/18/20 1000  BP:  (!) 98/59 (!) 102/51 (!) 90/58  Pulse:  66 60 (!) 56  Resp:  14 20 19   Temp: 97.9 F (36.6 C)     TempSrc: Oral     SpO2:  98% 99% 96%  Weight:      Height:        Intake/Output Summary (Last 24 hours) at 04/18/2020 1031 Last data filed at 04/18/2020 0100 Gross per 24 hour  Intake 650.14 ml  Output --  Net 650.14 ml   Filed Weights   04/17/20 2132  Weight: 65.8 kg    Examination:  General exam: Chronically ill disheveled cachectic male sitting up in bed, awake alert oriented to self and place, hoarse dysarthric speech HEENT: Dry mucous membranes, poor dentition, few spider nevi on his upper chest CVS: S1-S2, regular rate rhythm Lungs: Poor air movement bilaterally, scattered rhonchi at the bases Abdomen: Soft, nontender, bowel sounds present Extremities: No edema Neuro: Moves all extremities, no localizing signs Psychiatry: Very poor insight and judgment.     Data Reviewed:   CBC: Recent Labs  Lab 04/17/20 1936  WBC 17.5*  NEUTROABS 15.8*  HGB 16.5  HCT 46.5  MCV 86.3  PLT 814   Basic Metabolic Panel: Recent Labs  Lab 04/17/20 1936 04/18/20 0700  NA 128* 129*  K 2.1* 2.9*  CL 82* 86*  CO2 31 34*  GLUCOSE 105* 98  BUN 79* 63*  CREATININE 1.99* 1.11  CALCIUM 7.5* 8.1*  MG 2.1  --   PHOS  --  2.1*   GFR: Estimated Creatinine Clearance: 70.8 mL/min (by C-G formula based on SCr of 1.11 mg/dL). Liver Function Tests: Recent Labs  Lab 04/17/20 1936 04/18/20 0700  AST 33 31  ALT 19 20  ALKPHOS 73 63  BILITOT 1.8*  1.3*  PROT 6.1* 5.2*  ALBUMIN 2.1* 1.7*   No results for input(s): LIPASE, AMYLASE in the last 168 hours. Recent Labs  Lab 04/17/20 2357  AMMONIA 25   Coagulation Profile: Recent Labs  Lab 04/17/20 1936  INR 1.4*   Cardiac Enzymes: Recent Labs  Lab 04/18/20 0700  CKTOTAL 117   BNP (last 3 results) No results for input(s): PROBNP in the last 8760 hours. HbA1C: No results for input(s): HGBA1C in the last 72 hours. CBG: No results for input(s): GLUCAP in the last 168 hours. Lipid Profile: No results for input(s): CHOL, HDL, LDLCALC, TRIG, CHOLHDL, LDLDIRECT in the last 72 hours. Thyroid Function Tests: No results for input(s): TSH, T4TOTAL, FREET4, T3FREE, THYROIDAB in the last 72 hours. Anemia Panel: No results for input(s): VITAMINB12, FOLATE, FERRITIN, TIBC, IRON, RETICCTPCT in the last 72 hours. Urine analysis:    Component Value Date/Time   COLORURINE AMBER (A) 04/17/2020 2133   APPEARANCEUR CLOUDY (A) 04/17/2020 2133   LABSPEC 1.014 04/17/2020 2133   PHURINE 5.0 04/17/2020 2133   GLUCOSEU NEGATIVE 04/17/2020 2133   HGBUR SMALL (A) 04/17/2020 2133   BILIRUBINUR NEGATIVE 04/17/2020 2133   KETONESUR NEGATIVE 04/17/2020 2133   PROTEINUR NEGATIVE 04/17/2020 2133   NITRITE NEGATIVE 04/17/2020 2133   LEUKOCYTESUR NEGATIVE 04/17/2020 2133   Sepsis Labs: @LABRCNTIP (procalcitonin:4,lacticidven:4)  ) Recent Results (from the past 240 hour(s))  Blood Culture (routine x 2)     Status: None (Preliminary result)   Collection Time: 04/17/20  7:35 PM   Specimen: BLOOD  Result Value Ref Range Status   Specimen Description BLOOD RIGHT ANTECUBITAL  Final   Special Requests   Final    BOTTLES DRAWN AEROBIC AND ANAEROBIC Blood Culture adequate volume   Culture   Final    NO GROWTH < 12 HOURS Performed at [facility], [address] [place], [place] [address]    Report Status PENDING  Incomplete  Resp Panel by RT-PCR (Flu A&B, Covid) Nasopharyngeal Swab      Status: None   Collection Time: 04/17/20  9:33 PM   Specimen: Nasopharyngeal Swab; Nasopharyngeal(NP) swabs in vial transport medium  Result Value Ref Range Status   SARS Coronavirus 2 by RT PCR NEGATIVE NEGATIVE Final    Comment: (NOTE) SARS-CoV-2 target nucleic acids are NOT DETECTED.  The SARS-CoV-2 RNA is generally detectable in upper respiratory specimens during the acute phase of infection. The lowest concentration of SARS-CoV-2 viral copies this assay can detect is 138 copies/mL. A negative result does not preclude SARS-Cov-2 infection and should not be used as the sole basis for treatment or other patient management decisions. A negative result may occur with  improper specimen collection/handling, submission of specimen other than nasopharyngeal swab, presence of viral mutation(s) within the areas targeted by this assay, and inadequate number of viral copies(<138 copies/mL). A negative result must be combined with clinical observations, patient  history, and epidemiological information. The expected result is Negative.  Fact Sheet for Patients:  EntrepreneurPulse.com.au  Fact Sheet for Healthcare Providers:  IncredibleEmployment.be  This test is no t yet approved or cleared by the [place] FDA and  has been authorized for detection and/or diagnosis of SARS-CoV-2 by FDA under an Emergency Use Authorization (EUA). This EUA will remain  in effect (meaning this test can be used) for the duration of the COVID-19 declaration under Section 564(b)(1) of the Act, 21 U.S.C.section 360bbb-3(b)(1), unless the authorization is terminated  or revoked sooner.       Influenza A by PCR NEGATIVE NEGATIVE Final   Influenza B by PCR NEGATIVE NEGATIVE Final    Comment: (NOTE) The Xpert Xpress SARS-CoV-2/FLU/RSV plus assay is intended as an aid in the diagnosis of influenza from Nasopharyngeal swab specimens and should not be used as a sole basis  for treatment. Nasal washings and aspirates are unacceptable for Xpert Xpress SARS-CoV-2/FLU/RSV testing.  Fact Sheet for Patients: EntrepreneurPulse.com.au  Fact Sheet for Healthcare Providers: IncredibleEmployment.be  This test is not yet approved or cleared by the [place] FDA and has been authorized for detection and/or diagnosis of SARS-CoV-2 by FDA under an Emergency Use Authorization (EUA). This EUA will remain in effect (meaning this test can be used) for the duration of the COVID-19 declaration under Section 564(b)(1) of the Act, 21 U.S.C. section 360bbb-3(b)(1), unless the authorization is terminated or revoked.  Performed at [facility], [place] [address], [place], [place] [address]   Blood Culture (routine x 2)     Status: None (Preliminary result)   Collection Time: 04/17/20 10:07 PM   Specimen: BLOOD RIGHT FOREARM  Result Value Ref Range Status   Specimen Description BLOOD RIGHT FOREARM  Final   Special Requests   Final    BOTTLES DRAWN AEROBIC AND ANAEROBIC Blood Culture adequate volume   Culture   Final    NO GROWTH < 12 HOURS Performed at [facility], [place] [address], [place], [place] [address]    Report Status PENDING  Incomplete         Radiology Studies: CT SOFT TISSUE NECK WO CONTRAST  Result Date: 04/18/2020 CLINICAL DATA:  Fatigue, cough and weakness EXAM: CT NECK WITHOUT CONTRAST TECHNIQUE: Multidetector CT imaging of the neck was performed following the standard protocol without intravenous contrast. COMPARISON:  None. FINDINGS: Pharynx and larynx: There is a mildly hyperdense focus at the posterior aspect of the left vocal fold with associated soft tissue thickening (3:95). The epiglottis is normal. Pharynx is normal. Salivary glands: No inflammation, mass, or stone. Thyroid: Normal Lymph nodes: None enlarged or abnormal density. Vascular: Negative. Limited intracranial: Negative. Visualized orbits:  Negative. Mastoids and visualized paranasal sinuses: Mild left maxillary mucosal disease. Skeleton: No acute or aggressive process. Upper chest: Hazy opacities in the left apex. Mild emphysema. Other: None. IMPRESSION: 1. Mildly hyperdense focus at the posterior aspect of the left vocal fold with associated soft tissue thickening. 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 Correlation with direct visualization is recommended. 2. Hazy opacities in the left apex, which may indicate infection. Emphysema (ICD10-J43.9). Electronically Signed   By: Ulyses Jarred M.D.   On: 04/18/2020 00:28   CT CHEST WO CONTRAST  Result Date: 04/18/2020 CLINICAL DATA:  Follow-up abnormal chest x-ray EXAM: CT CHEST WITHOUT CONTRAST TECHNIQUE: Multidetector CT imaging of the chest was performed following the standard protocol without IV contrast. COMPARISON:  Chest x-ray from the previous day. FINDINGS: Cardiovascular:  Somewhat limited due to lack of IV contrast. Mild atherosclerotic calcifications are noted in the thoracic aorta. No aneurysmal dilatation is noted. No cardiac enlargement is seen. Mild coronary calcifications are noted. The pulmonary artery as visualized appears within normal limits. Mediastinum/Nodes: Thoracic inlet shows some mild fullness in the region of the vocal cords better evaluated on recent CT of the neck. Thyroid is within normal limits. Scattered small mediastinal lymph nodes are noted. The esophagus is within normal limits. Lungs/Pleura: Lungs are well aerated bilaterally with a combination of fine ground-glass opacities as well as more nodular tree-in-bud changes throughout the lower lobes left greater than right. These changes are consistent with diffuse pneumonia. There are worsened in the interval from the prior chest x-ray. Possibility of superimposed aspiration would deserve consideration as well as there is considerable inspissated material within the bronchial tree particularly in the left lower lobe. Upper  Abdomen: Visualized upper abdomen shows no acute abnormality. Calcification along the margin of the spleen is noted. This may be related to prior trauma. Musculoskeletal: Degenerative changes of the thoracic spine are noted. IMPRESSION: Diffuse bilateral pulmonary parenchymal changes consistent with multifocal pneumonia. Possibility of superimposed aspiration deserves consideration as well given inspissated material within the lower lobe bronchial tree particularly on the left. Correlate clinically as to any immunocompromised state. Fullness in the region of the vocal cords incompletely evaluated on this exam. Aortic Atherosclerosis (ICD10-I70.0). Electronically Signed   By: Inez Catalina M.D.   On: 04/18/2020 00:20   DG Chest Port 1 View  Result Date: 04/17/2020 CLINICAL DATA:  Weight loss cough EXAM: PORTABLE CHEST 1 VIEW COMPARISON:  None. FINDINGS: Hyperinflation. Ill-defined left greater than right reticulonodular and ground-glass opacity. No pleural effusion. Normal heart size. No pneumothorax. IMPRESSION: Ill-defined left greater than right reticulonodular and ground-glass opacity, suspicious for bilateral pneumonia, potentially atypical or viral pneumonia. Electronically Signed   By: Donavan Foil M.D.   On: 04/17/2020 19:14   US Abdomen Limited RUQ (LIVER/GB)  Result Date: 04/18/2020 CLINICAL DATA:  Alcohol abuse. EXAM: ULTRASOUND ABDOMEN LIMITED RIGHT UPPER QUADRANT COMPARISON:  None. FINDINGS: Gallbladder: Gallbladder sludge fills the majority of the lumen of the gallbladder. No gallstones or wall thickening visualized. No pericholecystic fluid. No sonographic Murphy sign noted by sonographer. Common bile duct: Diameter: 3 mm. Liver: No focal lesion identified. Within normal limits in parenchymal echogenicity. Portal vein is patent on color Doppler imaging with normal direction of blood flow towards the liver. Other: None. IMPRESSION: Gallbladder sludge fills the majority of the lumen of the  gallbladder. No associate findings of acute cholecystitis or choledocholithiasis. Electronically Signed   By: Iven Finn M.D.   On: 04/18/2020 02:16        Scheduled Meds: . nicotine  21 mg Transdermal Daily  . vancomycin variable dose per unstable renal function (pharmacist dosing)   Does not apply See admin instructions   Continuous Infusions: . azithromycin Stopped (04/18/20 0100)  . ceFEPime (MAXIPIME) IV    . lactated ringers 150 mL/hr at 04/18/20 0737  . vancomycin       LOS: 1 day    Time spent: 73min  Domenic Polite, MD [facility]  04/18/2020, 10:31 AM

## 2020-04-18 NOTE — ED Notes (Signed)
The last run of potassium was just hung

## 2020-04-18 NOTE — Consult Note (Addendum)
Reason for Consult: Difficulty swallowing Referring Physician: Domenic Polite, MD  Mario Peters is an 55 y.o. male.  HPI: Long history of smoking and drinking, approximately 1 pack of smoking per day and 1 case of beer daily.  Over the past 4 months he has been having increasing difficulty with swallowing.  He has lost a lot of weight not sure how much.  He does not go to the doctor.  He was admitted with dehydration and hypotension as well as infection.  CT scan revealed a laryngeal mass.  Past Medical History:  Diagnosis Date  . Seizures ([place])     History reviewed. No pertinent surgical history.  Family History  Problem Relation Age of Onset  . Hypertension Other     Social History:  reports that he has been smoking. He has never used smokeless tobacco. He reports previous alcohol use. He reports that he does not use drugs.  Allergies: No Known Allergies  Medications: Reviewed  Results for orders placed or performed during the hospital encounter of 04/17/20 (from the past 48 hour(s))  POC occult blood, ED     Status: None   Collection Time: 04/17/20  7:06 PM  Result Value Ref Range   Fecal Occult Bld NEGATIVE NEGATIVE  Blood Culture (routine x 2)     Status: None (Preliminary result)   Collection Time: 04/17/20  7:35 PM   Specimen: BLOOD  Result Value Ref Range   Specimen Description BLOOD RIGHT ANTECUBITAL    Special Requests      BOTTLES DRAWN AEROBIC AND ANAEROBIC Blood Culture adequate volume   Culture      NO GROWTH < 12 HOURS Performed at [facility] [address] [place], [place] [address]    Report Status PENDING   Lactic acid, plasma     Status: Abnormal   Collection Time: 04/17/20  7:36 PM  Result Value Ref Range   Lactic Acid, Venous 5.0 (HH) 0.5 - 1.9 mmol/L    Comment: CRITICAL RESULT CALLED TO, READ BACK BY AND VERIFIED WITH: Dennie Maizes, RN 2136 04/17/20 A. MCDOWELL Performed at [facility], [place] [address] [place], [place] [address]    Comprehensive metabolic panel     Status: Abnormal   Collection Time: 04/17/20  7:36 PM  Result Value Ref Range   Sodium 128 (L) 135 - 145 mmol/L   Potassium 2.1 (LL) 3.5 - 5.1 mmol/L    Comment: CRITICAL RESULT CALLED TO, READ BACK BY AND VERIFIED WITH: Dennie Maizes, RN 2148 04/17/20 A. MCDOWELL    Chloride 82 (L) 98 - 111 mmol/L   CO2 31 22 - 32 mmol/L   Glucose, Bld 105 (H) 70 - 99 mg/dL    Comment: Glucose reference range applies only to samples taken after fasting for at least 8 hours.   BUN 79 (H) 6 - 20 mg/dL   Creatinine, Ser 1.99 (H) 0.61 - 1.24 mg/dL   Calcium 7.5 (L) 8.9 - 10.3 mg/dL   Total Protein 6.1 (L) 6.5 - 8.1 g/dL   Albumin 2.1 (L) 3.5 - 5.0 g/dL   AST 33 15 - 41 U/L   ALT 19 0 - 44 U/L   Alkaline Phosphatase 73 38 - 126 U/L   Total Bilirubin 1.8 (H) 0.3 - 1.2 mg/dL   GFR, Estimated 39 (L) >60 mL/min    Comment: (NOTE) Calculated using the CKD-EPI Creatinine Equation (2021)    Anion gap 15 5 - 15    Comment: Performed  at [facility], [place] [address] [place], [place] [address]  CBC WITH DIFFERENTIAL     Status: Abnormal   Collection Time: 04/17/20  7:36 PM  Result Value Ref Range   WBC 17.5 (H) 4.0 - 10.5 K/uL   RBC 5.39 4.22 - 5.81 MIL/uL   Hemoglobin 16.5 13.0 - 17.0 g/dL   HCT 46.5 39.0 - 52.0 %   MCV 86.3 80.0 - 100.0 fL   MCH 30.6 26.0 - 34.0 pg   MCHC 35.5 30.0 - 36.0 g/dL   RDW 11.5 11.5 - 15.5 %   Platelets 244 150 - 400 K/uL   nRBC 0.0 0.0 - 0.2 %   Neutrophils Relative % 90 %   Lymphocytes Relative 3 %   Monocytes Relative 4 %   Eosinophils Relative 3 %   Basophils Relative 0 %   Band Neutrophils 0 %   Metamyelocytes Relative 0 %   Myelocytes 0 %   Promyelocytes Relative 0 %   Blasts 0 %   nRBC 0 0 /100 WBC   Other 0 %   Neutro Abs 15.8 (H) 1.7 - 7.7 K/uL   Lymphs Abs 0.5 (L) 0.7 - 4.0 K/uL   Monocytes Absolute 0.7 0.1 - 1.0 K/uL   Eosinophils Absolute 0.5 0.0 - 0.5 K/uL   Basophils Absolute 0.0 0.0 - 0.1 K/uL   Abs Immature  Granulocytes 0.00 0.00 - 0.07 K/uL   WBC Morphology VACUOLATED NEUTROPHILS     Comment: INCREASED BANDS (>20% BANDS) MILD LEFT SHIFT (1-5% METAS, OCC MYELO, OCC BANDS) TOXIC GRANULATION Performed at [facility], [address], [place], [place] [address]   Protime-INR     Status: Abnormal   Collection Time: 04/17/20  7:36 PM  Result Value Ref Range   Prothrombin Time 16.4 (H) 11.4 - 15.2 seconds   INR 1.4 (H) 0.8 - 1.2    Comment: (NOTE) INR goal varies based on device and disease states. Performed at [facility], [place] [address], [place], [place] [address]   APTT     Status: None   Collection Time: 04/17/20  7:36 PM  Result Value Ref Range   aPTT 30 24 - 36 seconds    Comment: Performed at [facility] [address], [place], [place] [address]  Troponin I (High Sensitivity)     Status: None   Collection Time: 04/17/20  7:36 PM  Result Value Ref Range   Troponin I (High Sensitivity) 16 <18 ng/L    Comment: (NOTE) Elevated high sensitivity troponin I (hsTnI) values and significant  changes across serial measurements may suggest ACS but many other  chronic and acute conditions are known to elevate hsTnI results.  Refer to the Links section for chest pain algorithms and additional  guidance. Performed at [facility], [place] [address] [place], [place] [address]   Magnesium     Status: None   Collection Time: 04/17/20  7:36 PM  Result Value Ref Range   Magnesium 2.1 1.7 - 2.4 mg/dL    Comment: Performed at [facility], [place] [address] [place],  [address]  Resp Panel by RT-PCR (Flu A&B, Covid) Nasopharyngeal Swab     Status: None   Collection Time: 04/17/20  9:33 PM   Specimen: Nasopharyngeal Swab; Nasopharyngeal(NP) swabs in vial transport medium  Result Value Ref Range   SARS Coronavirus 2 by RT PCR NEGATIVE NEGATIVE    Comment: (NOTE) SARS-CoV-2 target nucleic acids  are NOT DETECTED.  The SARS-CoV-2 RNA is generally detectable in  upper respiratory specimens during the acute phase of infection. The lowest concentration of SARS-CoV-2 viral copies this assay can detect is 138 copies/mL. A negative result does not preclude SARS-Cov-2 infection and should not be used as the sole basis for treatment or other patient management decisions. A negative result may occur with  improper specimen collection/handling, submission of specimen other than nasopharyngeal swab, presence of viral mutation(s) within the areas targeted by this assay, and inadequate number of viral copies(<138 copies/mL). A negative result must be combined with clinical observations, patient history, and epidemiological information. The expected result is Negative.  Fact Sheet for Patients:  EntrepreneurPulse.com.au  Fact Sheet for Healthcare Providers:  IncredibleEmployment.be  This test is no t yet approved or cleared by the [place] FDA and  has been authorized for detection and/or diagnosis of SARS-CoV-2 by FDA under an Emergency Use Authorization (EUA). This EUA will remain  in effect (meaning this test can be used) for the duration of the COVID-19 declaration under Section 564(b)(1) of the Act, 21 U.S.C.section 360bbb-3(b)(1), unless the authorization is terminated  or revoked sooner.       Influenza A by PCR NEGATIVE NEGATIVE   Influenza B by PCR NEGATIVE NEGATIVE    Comment: (NOTE) The Xpert Xpress SARS-CoV-2/FLU/RSV plus assay is intended as an aid in the diagnosis of influenza from Nasopharyngeal swab specimens and should not be used as a sole basis for treatment. Nasal washings and aspirates are unacceptable for Xpert Xpress SARS-CoV-2/FLU/RSV testing.  Fact Sheet for Patients: EntrepreneurPulse.com.au  Fact Sheet for Healthcare Providers: IncredibleEmployment.be  This test is not yet approved or cleared by the [place] FDA and has been authorized  for detection and/or diagnosis of SARS-CoV-2 by FDA under an Emergency Use Authorization (EUA). This EUA will remain in effect (meaning this test can be used) for the duration of the COVID-19 declaration under Section 564(b)(1) of the Act, 21 U.S.C. section 360bbb-3(b)(1), unless the authorization is terminated or revoked.  Performed at [facility], [place] [address] [place], [place] [address]   Urinalysis, Routine w reflex microscopic Urine, Clean Catch     Status: Abnormal   Collection Time: 04/17/20  9:33 PM  Result Value Ref Range   Color, Urine AMBER (A) YELLOW    Comment: BIOCHEMICALS MAY BE AFFECTED BY COLOR   APPearance CLOUDY (A) CLEAR   Specific Gravity, Urine 1.014 1.005 - 1.030   pH 5.0 5.0 - 8.0   Glucose, UA NEGATIVE NEGATIVE mg/dL   Hgb urine dipstick SMALL (A) NEGATIVE   Bilirubin Urine NEGATIVE NEGATIVE   Ketones, ur NEGATIVE NEGATIVE mg/dL   Protein, ur NEGATIVE NEGATIVE mg/dL   Nitrite NEGATIVE NEGATIVE   Leukocytes,Ua NEGATIVE NEGATIVE   RBC / HPF 0-5 0 - 5 RBC/hpf   WBC, UA 6-10 0 - 5 WBC/hpf   Bacteria, UA NONE SEEN NONE SEEN   Squamous Epithelial / LPF 0-5 0 - 5   Mucus PRESENT    Hyaline Casts, UA PRESENT     Comment: Performed at [facility], [address] [place], [place] [address]  Lactic acid, plasma     Status: Abnormal   Collection Time: 04/17/20 10:06 PM  Result Value Ref Range   Lactic Acid, Venous 3.4 (HH) 0.5 - 1.9 mmol/L    Comment: CRITICAL VALUE NOTED.  VALUE IS CONSISTENT WITH PREVIOUSLY REPORTED AND CALLED VALUE. Performed at [facility], [place]  [address] [place], [place] [address]   Troponin I (High Sensitivity)     Status: None   Collection Time: 04/17/20 10:06 PM  Result Value Ref Range   Troponin I (High Sensitivity) 14 <18 ng/L    Comment: (NOTE) Elevated high sensitivity troponin I (hsTnI) values and significant  changes across serial measurements may suggest ACS but many other  chronic and acute conditions  are known to elevate hsTnI results.  Refer to the "Links" section for chest pain algorithms and additional  guidance. Performed at [facility], [place] [address], [place], [place] [address]   Blood Culture (routine x 2)     Status: None (Preliminary result)   Collection Time: 04/17/20 10:07 PM   Specimen: BLOOD RIGHT FOREARM  Result Value Ref Range   Specimen Description BLOOD RIGHT FOREARM    Special Requests      BOTTLES DRAWN AEROBIC AND ANAEROBIC Blood Culture adequate volume   Culture      NO GROWTH < 12 HOURS Performed at [facility], [place] [address] [place], [place] [address]    Report Status PENDING   Sodium, urine, random     Status: None   Collection Time: 04/17/20 11:27 PM  Result Value Ref Range   Sodium, Ur <10 mmol/L    Comment: Performed at [facility] [address] [place], [place] [address]  Creatinine, urine, random     Status: None   Collection Time: 04/17/20 11:27 PM  Result Value Ref Range   Creatinine, Urine 70.96 mg/dL    Comment: Performed at [facility] [address] [place], [place] [address]  Osmolality, urine     Status: None   Collection Time: 04/17/20 11:27 PM  Result Value Ref Range   Osmolality, Ur 468 300 - 900 mOsm/kg    Comment: Performed at [facility] [address], [place], [place] [address]  Ammonia     Status: None   Collection Time: 04/17/20 11:57 PM  Result Value Ref Range   Ammonia 25 9 - 35 umol/L    Comment: Performed at [facility], [place] [address] [place], [place] [address]  APTT     Status: None   Collection Time: 04/17/20 11:57 PM  Result Value Ref Range   aPTT 25 24 - 36 seconds    Comment: Performed at [facility] [address], [place], [place] [address]  I-Stat beta hCG blood, ED ([facility], [facility], AP only)     Status: Abnormal   Collection Time: 04/17/20 11:59 PM  Result Value Ref Range   I-stat hCG, quantitative 6.1 (H) <5 mIU/mL    Comment: PATIENT IDENTIFICATION ERROR.  PLEASE DISREGARD RESULTS. ACCOUNT WILL BE CREDITED. Performed at [facility], [place] [address], [place], [place] [address]    Comment 3            Comment:   GEST. AGE      CONC.  (mIU/mL)   <=1 WEEK        5 - 50     2 WEEKS       50 - 500     3 WEEKS       100 - 10,000     4 WEEKS     1,000 - 30,000        MALE AND NON-PREGNANT MALE:     LESS THAN 5 mIU/mL   I-Stat arterial blood gas, ED  Status: Abnormal   Collection Time: 04/18/20 12:10 AM  Result Value Ref Range   pH, Arterial 7.450 7.350 - 7.450   pCO2 arterial 41.1 32.0 - 48.0 mmHg   pO2, Arterial 31 (LL) 83.0 - 108.0 mmHg   Bicarbonate 28.6 (H) 20.0 - 28.0 mmol/L   TCO2 30 22 - 32 mmol/L   O2 Saturation 63.0 %   Acid-Base Excess 4.0 (H) 0.0 - 2.0 mmol/L   Sodium 139 135 - 145 mmol/L   Potassium 3.4 (L) 3.5 - 5.1 mmol/L   Calcium, Ion 1.17 1.15 - 1.40 mmol/L   HCT 43.0 39.0 - 52.0 %   Hemoglobin 14.6 13.0 - 17.0 g/dL   Sample type VENOUS    Comment 300     Comment: Performed at [facility], [address], [place], [place] [address]  Lactic acid, plasma     Status: None   Collection Time: 04/18/20  1:00 AM  Result Value Ref Range   Lactic Acid, Venous 1.3 0.5 - 1.9 mmol/L    Comment: Performed at [facility] [address] [place], [place] [address]  Lactic acid, plasma     Status: None   Collection Time: 04/18/20  3:00 AM  Result Value Ref Range   Lactic Acid, Venous 1.6 0.5 - 1.9 mmol/L    Comment: Performed at [facility] [address] [place], [place] [address]  Comprehensive metabolic panel     Status: Abnormal   Collection Time: 04/18/20  7:00 AM  Result Value Ref Range   Sodium 129 (L) 135 - 145 mmol/L   Potassium 2.9 (L) 3.5 - 5.1 mmol/L   Chloride 86 (L) 98 - 111 mmol/L   CO2 34 (H) 22 - 32 mmol/L   Glucose, Bld 98 70 - 99 mg/dL    Comment: Glucose reference range applies only to samples taken after fasting for at least 8 hours.   BUN 63 (H) 6 - 20 mg/dL    Creatinine, Ser 1.11 0.61 - 1.24 mg/dL   Calcium 8.1 (L) 8.9 - 10.3 mg/dL   Total Protein 5.2 (L) 6.5 - 8.1 g/dL   Albumin 1.7 (L) 3.5 - 5.0 g/dL   AST 31 15 - 41 U/L   ALT 20 0 - 44 U/L   Alkaline Phosphatase 63 38 - 126 U/L   Total Bilirubin 1.3 (H) 0.3 - 1.2 mg/dL   GFR, Estimated >60 >60 mL/min    Comment: (NOTE) Calculated using the CKD-EPI Creatinine Equation (2021)    Anion gap 9 5 - 15    Comment: Performed at [facility], [place] [address], [place], [place] [address]  Osmolality     Status: None   Collection Time: 04/18/20  7:00 AM  Result Value Ref Range   Osmolality 290 275 - 295 mOsm/kg    Comment: Performed at [facility], [place] [address] [place], [place] [address]  Prealbumin     Status: Abnormal   Collection Time: 04/18/20  7:00 AM  Result Value Ref Range   Prealbumin <5 (L) 18 - 38 mg/dL    Comment: Performed at [facility] [address], [place], [place] [address]  CK     Status: None   Collection Time: 04/18/20  7:00 AM  Result Value Ref Range   Total CK 117 49 - 397 U/L    Comment: Performed at [facility], [place] [address], [place], [place] [address]  Phosphorus  Status: Abnormal   Collection Time: 04/18/20  7:00 AM  Result Value Ref Range   Phosphorus 2.1 (L) 2.5 - 4.6 mg/dL    Comment: Performed at [facility] [address] [place], [place] [address]    CT SOFT TISSUE NECK WO CONTRAST  Result Date: 04/18/2020 CLINICAL DATA:  Fatigue, cough and weakness EXAM: CT NECK WITHOUT CONTRAST TECHNIQUE: Multidetector CT imaging of the neck was performed following the standard protocol without intravenous contrast. COMPARISON:  None. FINDINGS: Pharynx and larynx: There is a mildly hyperdense focus at the posterior aspect of the left vocal fold with associated soft tissue thickening (3:95). The epiglottis is normal. Pharynx is normal. Salivary glands: No inflammation, mass, or stone. Thyroid: Normal Lymph nodes: None enlarged or  abnormal density. Vascular: Negative. Limited intracranial: Negative. Visualized orbits: Negative. Mastoids and visualized paranasal sinuses: Mild left maxillary mucosal disease. Skeleton: No acute or aggressive process. Upper chest: Hazy opacities in the left apex. Mild emphysema. Other: None. IMPRESSION: 1. Mildly hyperdense focus at the posterior aspect of the left vocal fold with associated soft tissue thickening. 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 Correlation with direct visualization is recommended. 2. Hazy opacities in the left apex, which may indicate infection. Emphysema (ICD10-J43.9). Electronically Signed   By: Ulyses Jarred M.D.   On: 04/18/2020 00:28   CT CHEST WO CONTRAST  Result Date: 04/18/2020 CLINICAL DATA:  Follow-up abnormal chest x-ray EXAM: CT CHEST WITHOUT CONTRAST TECHNIQUE: Multidetector CT imaging of the chest was performed following the standard protocol without IV contrast. COMPARISON:  Chest x-ray from the previous day. FINDINGS: Cardiovascular: Somewhat limited due to lack of IV contrast. Mild atherosclerotic calcifications are noted in the thoracic aorta. No aneurysmal dilatation is noted. No cardiac enlargement is seen. Mild coronary calcifications are noted. The pulmonary artery as visualized appears within normal limits. Mediastinum/Nodes: Thoracic inlet shows some mild fullness in the region of the vocal cords better evaluated on recent CT of the neck. Thyroid is within normal limits. Scattered small mediastinal lymph nodes are noted. The esophagus is within normal limits. Lungs/Pleura: Lungs are well aerated bilaterally with a combination of fine ground-glass opacities as well as more nodular tree-in-bud changes throughout the lower lobes left greater than right. These changes are consistent with diffuse pneumonia. There are worsened in the interval from the prior chest x-ray. Possibility of superimposed aspiration would deserve consideration as well as there is considerable  inspissated material within the bronchial tree particularly in the left lower lobe. Upper Abdomen: Visualized upper abdomen shows no acute abnormality. Calcification along the margin of the spleen is noted. This may be related to prior trauma. Musculoskeletal: Degenerative changes of the thoracic spine are noted. IMPRESSION: Diffuse bilateral pulmonary parenchymal changes consistent with multifocal pneumonia. Possibility of superimposed aspiration deserves consideration as well given inspissated material within the lower lobe bronchial tree particularly on the left. Correlate clinically as to any immunocompromised state. Fullness in the region of the vocal cords incompletely evaluated on this exam. Aortic Atherosclerosis (ICD10-I70.0). Electronically Signed   By: Inez Catalina M.D.   On: 04/18/2020 00:20   DG Chest Port 1 View  Result Date: 04/17/2020 CLINICAL DATA:  Weight loss cough EXAM: PORTABLE CHEST 1 VIEW COMPARISON:  None. FINDINGS: Hyperinflation. Ill-defined left greater than right reticulonodular and ground-glass opacity. No pleural effusion. Normal heart size. No pneumothorax. IMPRESSION: Ill-defined left greater than right reticulonodular and ground-glass opacity, suspicious for bilateral pneumonia, potentially atypical or viral pneumonia. Electronically Signed   By: Maudie Mercury  Francoise Ceo M.D.   On: 04/17/2020 19:14   US Abdomen Limited RUQ (LIVER/GB)  Result Date: 04/18/2020 CLINICAL DATA:  Alcohol abuse. EXAM: ULTRASOUND ABDOMEN LIMITED RIGHT UPPER QUADRANT COMPARISON:  None. FINDINGS: Gallbladder: Gallbladder sludge fills the majority of the lumen of the gallbladder. No gallstones or wall thickening visualized. No pericholecystic fluid. No sonographic Murphy sign noted by sonographer. Common bile duct: Diameter: 3 mm. Liver: No focal lesion identified. Within normal limits in parenchymal echogenicity. Portal vein is patent on color Doppler imaging with normal direction of blood flow towards the liver.  Other: None. IMPRESSION: Gallbladder sludge fills the majority of the lumen of the gallbladder. No associate findings of acute cholecystitis or choledocholithiasis. Electronically Signed   By: Iven Finn M.D.   On: 04/18/2020 02:16    WLS:[id number] except as listed in admit H&P  Blood pressure (!) 101/51, pulse (!) 58, temperature 97.9 F (36.6 C), temperature source Oral, resp. rate 20, height 5\' 7"  (1.702 m), weight 65.8 kg, SpO2 91 %.  PHYSICAL EXAM: Overall appearance: Thin gentleman, in no distress, but with biphasic stridor and a weak voice. Head:  Normocephalic, atraumatic. Ears: External ears look healthy. Nose: External nose is healthy in appearance. Internal nasal exam free of any lesions or obstruction. Oral Cavity/Pharynx:  There are no mucosal lesions or masses identified.  Dentition in poor shape. Larynx/Hypopharynx: See below Neuro:  No identifiable neurologic deficits. Neck: No palpable neck masses.  Studies Reviewed: CT neck  Procedures: Flexible fiberoptic laryngoscopy was performed.  Topical Xylocaine/Afrin was sprayed into the nasal cavities.  The flexible scope was then passed down the nasal cavity through the nasopharynx and into the oropharynx and hypopharynx.  Oropharynx and hypopharynx were mostly clear although the entire endolarynx is replaced by a papillomatous appearing tumor mass that spills over into the medial aspect of the piriforms.  Cord mobility is impacted bilaterally.  Airway is partially compromised.   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  Recommend tracheostomy under local anesthesia after which we can safely put him under general anesthesia and perform direct laryngoscopy and biopsy and esophagoscopy.  Following that we can discuss future treatment whether it is surgical or chemo/radiation.  [name] 04/18/2020, 5:01 PM    Diagnosis laryngeal mass:J38.7 Stridor R06.1

## 2020-04-18 NOTE — H&P (View-Only) (Signed)
Reason for Consult: Difficulty swallowing Referring Physician: Domenic Polite, MD  Mario Peters is an 55 y.o. male.  HPI: Long history of smoking and drinking, approximately 1 pack of smoking per day and 1 case of beer daily.  Over the past 4 months he has been having increasing difficulty with swallowing.  He has lost a lot of weight not sure how much.  He does not go to the doctor.  He was admitted with dehydration and hypotension as well as infection.  CT scan revealed a laryngeal mass.  Past Medical History:  Diagnosis Date  . Seizures ([facility])     History reviewed. No pertinent surgical history.  Family History  Problem Relation Age of Onset  . Hypertension Other     Social History:  reports that he has been smoking. He has never used smokeless tobacco. He reports previous alcohol use. He reports that he does not use drugs.  Allergies: No Known Allergies  Medications: Reviewed  Results for orders placed or performed during the hospital encounter of 04/17/20 (from the past 48 hour(s))  POC occult blood, ED     Status: None   Collection Time: 04/17/20  7:06 PM  Result Value Ref Range   Fecal Occult Bld NEGATIVE NEGATIVE  Blood Culture (routine x 2)     Status: None (Preliminary result)   Collection Time: 04/17/20  7:35 PM   Specimen: BLOOD  Result Value Ref Range   Specimen Description BLOOD RIGHT ANTECUBITAL    Special Requests      BOTTLES DRAWN AEROBIC AND ANAEROBIC Blood Culture adequate volume   Culture      NO GROWTH < 12 HOURS Performed at [facility] [address], [place], [place] [address]    Report Status PENDING   Lactic acid, plasma     Status: Abnormal   Collection Time: 04/17/20  7:36 PM  Result Value Ref Range   Lactic Acid, Venous 5.0 (HH) 0.5 - 1.9 mmol/L    Comment: CRITICAL RESULT CALLED TO, READ BACK BY AND VERIFIED WITH: Dennie Maizes, RN 2136 04/17/20 A. MCDOWELL Performed at [facility], [place] [address] [place], [place] [address]    Comprehensive metabolic panel     Status: Abnormal   Collection Time: 04/17/20  7:36 PM  Result Value Ref Range   Sodium 128 (L) 135 - 145 mmol/L   Potassium 2.1 (LL) 3.5 - 5.1 mmol/L    Comment: CRITICAL RESULT CALLED TO, READ BACK BY AND VERIFIED WITH: Dennie Maizes, RN 2148 04/17/20 A. MCDOWELL    Chloride 82 (L) 98 - 111 mmol/L   CO2 31 22 - 32 mmol/L   Glucose, Bld 105 (H) 70 - 99 mg/dL    Comment: Glucose reference range applies only to samples taken after fasting for at least 8 hours.   BUN 79 (H) 6 - 20 mg/dL   Creatinine, Ser 1.99 (H) 0.61 - 1.24 mg/dL   Calcium 7.5 (L) 8.9 - 10.3 mg/dL   Total Protein 6.1 (L) 6.5 - 8.1 g/dL   Albumin 2.1 (L) 3.5 - 5.0 g/dL   AST 33 15 - 41 U/L   ALT 19 0 - 44 U/L   Alkaline Phosphatase 73 38 - 126 U/L   Total Bilirubin 1.8 (H) 0.3 - 1.2 mg/dL   GFR, Estimated 39 (L) >60 mL/min    Comment: (NOTE) Calculated using the CKD-EPI Creatinine Equation (2021)    Anion gap 15 5 - 15    Comment: Performed  at [facility], [place] [address], [place], [place] [address]  CBC WITH DIFFERENTIAL     Status: Abnormal   Collection Time: 04/17/20  7:36 PM  Result Value Ref Range   WBC 17.5 (H) 4.0 - 10.5 K/uL   RBC 5.39 4.22 - 5.81 MIL/uL   Hemoglobin 16.5 13.0 - 17.0 g/dL   HCT 46.5 39.0 - 52.0 %   MCV 86.3 80.0 - 100.0 fL   MCH 30.6 26.0 - 34.0 pg   MCHC 35.5 30.0 - 36.0 g/dL   RDW 11.5 11.5 - 15.5 %   Platelets 244 150 - 400 K/uL   nRBC 0.0 0.0 - 0.2 %   Neutrophils Relative % 90 %   Lymphocytes Relative 3 %   Monocytes Relative 4 %   Eosinophils Relative 3 %   Basophils Relative 0 %   Band Neutrophils 0 %   Metamyelocytes Relative 0 %   Myelocytes 0 %   Promyelocytes Relative 0 %   Blasts 0 %   nRBC 0 0 /100 WBC   Other 0 %   Neutro Abs 15.8 (H) 1.7 - 7.7 K/uL   Lymphs Abs 0.5 (L) 0.7 - 4.0 K/uL   Monocytes Absolute 0.7 0.1 - 1.0 K/uL   Eosinophils Absolute 0.5 0.0 - 0.5 K/uL   Basophils Absolute 0.0 0.0 - 0.1 K/uL   Abs Immature  Granulocytes 0.00 0.00 - 0.07 K/uL   WBC Morphology VACUOLATED NEUTROPHILS     Comment: INCREASED BANDS (>20% BANDS) MILD LEFT SHIFT (1-5% METAS, OCC MYELO, OCC BANDS) TOXIC GRANULATION Performed at [facility], [address], [place], [place] [address]   Protime-INR     Status: Abnormal   Collection Time: 04/17/20  7:36 PM  Result Value Ref Range   Prothrombin Time 16.4 (H) 11.4 - 15.2 seconds   INR 1.4 (H) 0.8 - 1.2    Comment: (NOTE) INR goal varies based on device and disease states. Performed at [facility], [place] [address], [place], [place] [address]   APTT     Status: None   Collection Time: 04/17/20  7:36 PM  Result Value Ref Range   aPTT 30 24 - 36 seconds    Comment: Performed at [facility] [address] [place], [place] [address]  Troponin I (High Sensitivity)     Status: None   Collection Time: 04/17/20  7:36 PM  Result Value Ref Range   Troponin I (High Sensitivity) 16 <18 ng/L    Comment: (NOTE) Elevated high sensitivity troponin I (hsTnI) values and significant  changes across serial measurements may suggest ACS but many other  chronic and acute conditions are known to elevate hsTnI results.  Refer to the Links section for chest pain algorithms and additional  guidance. Performed at [facility], [place] [address], [place], [place] [address]   Magnesium     Status: None   Collection Time: 04/17/20  7:36 PM  Result Value Ref Range   Magnesium 2.1 1.7 - 2.4 mg/dL    Comment: Performed at [facility], [place] [address], [place], [place] [address]  Resp Panel by RT-PCR (Flu A&B, Covid) Nasopharyngeal Swab     Status: None   Collection Time: 04/17/20  9:33 PM   Specimen: Nasopharyngeal Swab; Nasopharyngeal(NP) swabs in vial transport medium  Result Value Ref Range   SARS Coronavirus 2 by RT PCR NEGATIVE NEGATIVE    Comment: (NOTE) SARS-CoV-2 target nucleic acids  are NOT DETECTED.  The SARS-CoV-2 RNA is generally detectable in  upper respiratory specimens during the acute phase of infection. The lowest concentration of SARS-CoV-2 viral copies this assay can detect is 138 copies/mL. A negative result does not preclude SARS-Cov-2 infection and should not be used as the sole basis for treatment or other patient management decisions. A negative result may occur with  improper specimen collection/handling, submission of specimen other than nasopharyngeal swab, presence of viral mutation(s) within the areas targeted by this assay, and inadequate number of viral copies(<138 copies/mL). A negative result must be combined with clinical observations, patient history, and epidemiological information. The expected result is Negative.  Fact Sheet for Patients:  EntrepreneurPulse.com.au  Fact Sheet for Healthcare Providers:  IncredibleEmployment.be  This test is no t yet approved or cleared by the [place] FDA and  has been authorized for detection and/or diagnosis of SARS-CoV-2 by FDA under an Emergency Use Authorization (EUA). This EUA will remain  in effect (meaning this test can be used) for the duration of the COVID-19 declaration under Section 564(b)(1) of the Act, 21 U.S.C.section 360bbb-3(b)(1), unless the authorization is terminated  or revoked sooner.       Influenza A by PCR NEGATIVE NEGATIVE   Influenza B by PCR NEGATIVE NEGATIVE    Comment: (NOTE) The Xpert Xpress SARS-CoV-2/FLU/RSV plus assay is intended as an aid in the diagnosis of influenza from Nasopharyngeal swab specimens and should not be used as a sole basis for treatment. Nasal washings and aspirates are unacceptable for Xpert Xpress SARS-CoV-2/FLU/RSV testing.  Fact Sheet for Patients: EntrepreneurPulse.com.au  Fact Sheet for Healthcare Providers: IncredibleEmployment.be  This test is not yet approved or cleared by the [place] FDA and has been authorized  for detection and/or diagnosis of SARS-CoV-2 by FDA under an Emergency Use Authorization (EUA). This EUA will remain in effect (meaning this test can be used) for the duration of the COVID-19 declaration under Section 564(b)(1) of the Act, 21 U.S.C. section 360bbb-3(b)(1), unless the authorization is terminated or revoked.  Performed at [facility], [place] [address], [place], [place] [address]   Urinalysis, Routine w reflex microscopic Urine, Clean Catch     Status: Abnormal   Collection Time: 04/17/20  9:33 PM  Result Value Ref Range   Color, Urine AMBER (A) YELLOW    Comment: BIOCHEMICALS MAY BE AFFECTED BY COLOR   APPearance CLOUDY (A) CLEAR   Specific Gravity, Urine 1.014 1.005 - 1.030   pH 5.0 5.0 - 8.0   Glucose, UA NEGATIVE NEGATIVE mg/dL   Hgb urine dipstick SMALL (A) NEGATIVE   Bilirubin Urine NEGATIVE NEGATIVE   Ketones, ur NEGATIVE NEGATIVE mg/dL   Protein, ur NEGATIVE NEGATIVE mg/dL   Nitrite NEGATIVE NEGATIVE   Leukocytes,Ua NEGATIVE NEGATIVE   RBC / HPF 0-5 0 - 5 RBC/hpf   WBC, UA 6-10 0 - 5 WBC/hpf   Bacteria, UA NONE SEEN NONE SEEN   Squamous Epithelial / LPF 0-5 0 - 5   Mucus PRESENT    Hyaline Casts, UA PRESENT     Comment: Performed at [facility], [address] [place], [place] [address]  Lactic acid, plasma     Status: Abnormal   Collection Time: 04/17/20 10:06 PM  Result Value Ref Range   Lactic Acid, Venous 3.4 (HH) 0.5 - 1.9 mmol/L    Comment: CRITICAL VALUE NOTED.  VALUE IS CONSISTENT WITH PREVIOUSLY REPORTED AND CALLED VALUE. Performed at [facility], [place]  [address] [place], [place] [address]   Troponin I (High Sensitivity)     Status: None   Collection Time: 04/17/20 10:06 PM  Result Value Ref Range   Troponin I (High Sensitivity) 14 <18 ng/L    Comment: (NOTE) Elevated high sensitivity troponin I (hsTnI) values and significant  changes across serial measurements may suggest ACS but many other  chronic and acute conditions  are known to elevate hsTnI results.  Refer to the "Links" section for chest pain algorithms and additional  guidance. Performed at [facility], [place] [address], [place], [place] [address]   Blood Culture (routine x 2)     Status: None (Preliminary result)   Collection Time: 04/17/20 10:07 PM   Specimen: BLOOD RIGHT FOREARM  Result Value Ref Range   Specimen Description BLOOD RIGHT FOREARM    Special Requests      BOTTLES DRAWN AEROBIC AND ANAEROBIC Blood Culture adequate volume   Culture      NO GROWTH < 12 HOURS Performed at [facility], [place] [address], [place], [place] [address]    Report Status PENDING   Sodium, urine, random     Status: None   Collection Time: 04/17/20 11:27 PM  Result Value Ref Range   Sodium, Ur <10 mmol/L    Comment: Performed at [facility] [address] [place], [place] [address]  Creatinine, urine, random     Status: None   Collection Time: 04/17/20 11:27 PM  Result Value Ref Range   Creatinine, Urine 70.96 mg/dL    Comment: Performed at [facility] [address], [place], [place] [address]  Osmolality, urine     Status: None   Collection Time: 04/17/20 11:27 PM  Result Value Ref Range   Osmolality, Ur 468 300 - 900 mOsm/kg    Comment: Performed at [facility] [address] [place], [place] [address]  Ammonia     Status: None   Collection Time: 04/17/20 11:57 PM  Result Value Ref Range   Ammonia 25 9 - 35 umol/L    Comment: Performed at [facility], [place] [address], [place], [place] [address]  APTT     Status: None   Collection Time: 04/17/20 11:57 PM  Result Value Ref Range   aPTT 25 24 - 36 seconds    Comment: Performed at [facility] [address], [place], [place] [address]  I-Stat beta hCG blood, ED ([facility], [facility], [facility] only)     Status: Abnormal   Collection Time: 04/17/20 11:59 PM  Result Value Ref Range   I-stat hCG, quantitative 6.1 (H) <5 mIU/mL    Comment: PATIENT IDENTIFICATION ERROR.  PLEASE DISREGARD RESULTS. ACCOUNT WILL BE CREDITED. Performed at [facility], [place] [address], [place], [place] [address]    Comment 3            Comment:   GEST. AGE      CONC.  (mIU/mL)   <=1 WEEK        5 - 50     2 WEEKS       50 - 500     3 WEEKS       100 - 10,000     4 WEEKS     1,000 - 30,000        MALE AND NON-PREGNANT MALE:     LESS THAN 5 mIU/mL   I-Stat arterial blood gas, ED  Status: Abnormal   Collection Time: 04/18/20 12:10 AM  Result Value Ref Range   pH, Arterial 7.450 7.350 - 7.450   pCO2 arterial 41.1 32.0 - 48.0 mmHg   pO2, Arterial 31 (LL) 83.0 - 108.0 mmHg   Bicarbonate 28.6 (H) 20.0 - 28.0 mmol/L   TCO2 30 22 - 32 mmol/L   O2 Saturation 63.0 %   Acid-Base Excess 4.0 (H) 0.0 - 2.0 mmol/L   Sodium 139 135 - 145 mmol/L   Potassium 3.4 (L) 3.5 - 5.1 mmol/L   Calcium, Ion 1.17 1.15 - 1.40 mmol/L   HCT 43.0 39.0 - 52.0 %   Hemoglobin 14.6 13.0 - 17.0 g/dL   Sample type VENOUS    Comment 300     Comment: Performed at [facility], [address] [place], [place] [address]  Lactic acid, plasma     Status: None   Collection Time: 04/18/20  1:00 AM  Result Value Ref Range   Lactic Acid, Venous 1.3 0.5 - 1.9 mmol/L    Comment: Performed at [facility] [address] [place], [place] [address]  Lactic acid, plasma     Status: None   Collection Time: 04/18/20  3:00 AM  Result Value Ref Range   Lactic Acid, Venous 1.6 0.5 - 1.9 mmol/L    Comment: Performed at [facility] [address] [place], [place] [address]  Comprehensive metabolic panel     Status: Abnormal   Collection Time: 04/18/20  7:00 AM  Result Value Ref Range   Sodium 129 (L) 135 - 145 mmol/L   Potassium 2.9 (L) 3.5 - 5.1 mmol/L   Chloride 86 (L) 98 - 111 mmol/L   CO2 34 (H) 22 - 32 mmol/L   Glucose, Bld 98 70 - 99 mg/dL    Comment: Glucose reference range applies only to samples taken after fasting for at least 8 hours.   BUN 63 (H) 6 - 20 mg/dL    Creatinine, Ser 1.11 0.61 - 1.24 mg/dL   Calcium 8.1 (L) 8.9 - 10.3 mg/dL   Total Protein 5.2 (L) 6.5 - 8.1 g/dL   Albumin 1.7 (L) 3.5 - 5.0 g/dL   AST 31 15 - 41 U/L   ALT 20 0 - 44 U/L   Alkaline Phosphatase 63 38 - 126 U/L   Total Bilirubin 1.3 (H) 0.3 - 1.2 mg/dL   GFR, Estimated >60 >60 mL/min    Comment: (NOTE) Calculated using the CKD-EPI Creatinine Equation (2021)    Anion gap 9 5 - 15    Comment: Performed at [facility], [place] [address] [place], [place] [address]  Osmolality     Status: None   Collection Time: 04/18/20  7:00 AM  Result Value Ref Range   Osmolality 290 275 - 295 mOsm/kg    Comment: Performed at [facility], [place] [address] [place], [place] [address]  Prealbumin     Status: Abnormal   Collection Time: 04/18/20  7:00 AM  Result Value Ref Range   Prealbumin <5 (L) 18 - 38 mg/dL    Comment: Performed at [facility] [address] [place], [place] [address]  CK     Status: None   Collection Time: 04/18/20  7:00 AM  Result Value Ref Range   Total CK 117 49 - 397 U/L    Comment: Performed at [facility], [place] [address], [place],  [address]  Phosphorus  Status: Abnormal   Collection Time: 04/18/20  7:00 AM  Result Value Ref Range   Phosphorus 2.1 (L) 2.5 - 4.6 mg/dL    Comment: Performed at [facility] [address] [place], [place] [address]    CT SOFT TISSUE NECK WO CONTRAST  Result Date: 04/18/2020 CLINICAL DATA:  Fatigue, cough and weakness EXAM: CT NECK WITHOUT CONTRAST TECHNIQUE: Multidetector CT imaging of the neck was performed following the standard protocol without intravenous contrast. COMPARISON:  None. FINDINGS: Pharynx and larynx: There is a mildly hyperdense focus at the posterior aspect of the left vocal fold with associated soft tissue thickening (3:95). The epiglottis is normal. Pharynx is normal. Salivary glands: No inflammation, mass, or stone. Thyroid: Normal Lymph nodes: None enlarged or  abnormal density. Vascular: Negative. Limited intracranial: Negative. Visualized orbits: Negative. Mastoids and visualized paranasal sinuses: Mild left maxillary mucosal disease. Skeleton: No acute or aggressive process. Upper chest: Hazy opacities in the left apex. Mild emphysema. Other: None. IMPRESSION: 1. Mildly hyperdense focus at the posterior aspect of the left vocal fold with associated soft tissue thickening. 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 Correlation with direct visualization is recommended. 2. Hazy opacities in the left apex, which may indicate infection. Emphysema (ICD10-J43.9). Electronically Signed   By: Ulyses Jarred M.D.   On: 04/18/2020 00:28   CT CHEST WO CONTRAST  Result Date: 04/18/2020 CLINICAL DATA:  Follow-up abnormal chest x-ray EXAM: CT CHEST WITHOUT CONTRAST TECHNIQUE: Multidetector CT imaging of the chest was performed following the standard protocol without IV contrast. COMPARISON:  Chest x-ray from the previous day. FINDINGS: Cardiovascular: Somewhat limited due to lack of IV contrast. Mild atherosclerotic calcifications are noted in the thoracic aorta. No aneurysmal dilatation is noted. No cardiac enlargement is seen. Mild coronary calcifications are noted. The pulmonary artery as visualized appears within normal limits. Mediastinum/Nodes: Thoracic inlet shows some mild fullness in the region of the vocal cords better evaluated on recent CT of the neck. Thyroid is within normal limits. Scattered small mediastinal lymph nodes are noted. The esophagus is within normal limits. Lungs/Pleura: Lungs are well aerated bilaterally with a combination of fine ground-glass opacities as well as more nodular tree-in-bud changes throughout the lower lobes left greater than right. These changes are consistent with diffuse pneumonia. There are worsened in the interval from the prior chest x-ray. Possibility of superimposed aspiration would deserve consideration as well as there is considerable  inspissated material within the bronchial tree particularly in the left lower lobe. Upper Abdomen: Visualized upper abdomen shows no acute abnormality. Calcification along the margin of the spleen is noted. This may be related to prior trauma. Musculoskeletal: Degenerative changes of the thoracic spine are noted. IMPRESSION: Diffuse bilateral pulmonary parenchymal changes consistent with multifocal pneumonia. Possibility of superimposed aspiration deserves consideration as well given inspissated material within the lower lobe bronchial tree particularly on the left. Correlate clinically as to any immunocompromised state. Fullness in the region of the vocal cords incompletely evaluated on this exam. Aortic Atherosclerosis (ICD10-I70.0). Electronically Signed   By: Inez Catalina M.D.   On: 04/18/2020 00:20   DG Chest Port 1 View  Result Date: 04/17/2020 CLINICAL DATA:  Weight loss cough EXAM: PORTABLE CHEST 1 VIEW COMPARISON:  None. FINDINGS: Hyperinflation. Ill-defined left greater than right reticulonodular and ground-glass opacity. No pleural effusion. Normal heart size. No pneumothorax. IMPRESSION: Ill-defined left greater than right reticulonodular and ground-glass opacity, suspicious for bilateral pneumonia, potentially atypical or viral pneumonia. Electronically Signed   By: Maudie Mercury  Francoise Ceo M.D.   On: 04/17/2020 19:14   US Abdomen Limited RUQ (LIVER/GB)  Result Date: 04/18/2020 CLINICAL DATA:  Alcohol abuse. EXAM: ULTRASOUND ABDOMEN LIMITED RIGHT UPPER QUADRANT COMPARISON:  None. FINDINGS: Gallbladder: Gallbladder sludge fills the majority of the lumen of the gallbladder. No gallstones or wall thickening visualized. No pericholecystic fluid. No sonographic Murphy sign noted by sonographer. Common bile duct: Diameter: 3 mm. Liver: No focal lesion identified. Within normal limits in parenchymal echogenicity. Portal vein is patent on color Doppler imaging with normal direction of blood flow towards the liver.  Other: None. IMPRESSION: Gallbladder sludge fills the majority of the lumen of the gallbladder. No associate findings of acute cholecystitis or choledocholithiasis. Electronically Signed   By: Iven Finn M.D.   On: 04/18/2020 02:16    EQA:[id number] except as listed in admit H&P  Blood pressure (!) 101/51, pulse (!) 58, temperature 97.9 F (36.6 C), temperature source Oral, resp. rate 20, height 5\' 7"  (1.702 m), weight 65.8 kg, SpO2 91 %.  PHYSICAL EXAM: Overall appearance: Thin gentleman, in no distress, but with biphasic stridor and a weak voice. Head:  Normocephalic, atraumatic. Ears: External ears look healthy. Nose: External nose is healthy in appearance. Internal nasal exam free of any lesions or obstruction. Oral Cavity/Pharynx:  There are no mucosal lesions or masses identified.  Dentition in poor shape. Larynx/Hypopharynx: See below Neuro:  No identifiable neurologic deficits. Neck: No palpable neck masses.  Studies Reviewed: CT neck  Procedures: Flexible fiberoptic laryngoscopy was performed.  Topical Xylocaine/Afrin was sprayed into the nasal cavities.  The flexible scope was then passed down the nasal cavity through the nasopharynx and into the oropharynx and hypopharynx.  Oropharynx and hypopharynx were mostly clear although the entire endolarynx is replaced by a papillomatous appearing tumor mass that spills over into the medial aspect of the piriforms.  Cord mobility is impacted bilaterally.  Airway is partially compromised.   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  Recommend tracheostomy under local anesthesia after which we can safely put him under general anesthesia and perform direct laryngoscopy and biopsy and esophagoscopy.  Following that we can discuss future treatment whether it is surgical or chemo/radiation.  [name] 04/18/2020, 5:01 PM    Diagnosis laryngeal mass:J38.7 Stridor R06.1

## 2020-04-19 ENCOUNTER — Encounter (HOSPITAL_COMMUNITY): Payer: Self-pay | Admitting: Anesthesiology

## 2020-04-19 ENCOUNTER — Encounter (HOSPITAL_COMMUNITY)
Admission: EM | Disposition: A | Discharge status: Home Health | Payer: Self-pay | Source: Home / Self Care | Attending: Internal Medicine

## 2020-04-19 DIAGNOSIS — F1010 Alcohol abuse, uncomplicated: Secondary | ICD-10-CM | POA: Diagnosis not present

## 2020-04-19 DIAGNOSIS — N179 Acute kidney failure, unspecified: Secondary | ICD-10-CM | POA: Diagnosis not present

## 2020-04-19 DIAGNOSIS — E876 Hypokalemia: Secondary | ICD-10-CM | POA: Diagnosis not present

## 2020-04-19 DIAGNOSIS — A419 Sepsis, unspecified organism: Secondary | ICD-10-CM | POA: Diagnosis not present

## 2020-04-19 LAB — CBC
HCT: 33.9 % — ABNORMAL LOW (ref 39.0–52.0)
Hemoglobin: 12 g/dL — ABNORMAL LOW (ref 13.0–17.0)
MCH: 31.1 pg (ref 26.0–34.0)
MCHC: 35.4 g/dL (ref 30.0–36.0)
MCV: 87.8 fL (ref 80.0–100.0)
Platelets: 200 10*3/uL (ref 150–400)
RBC: 3.86 MIL/uL — ABNORMAL LOW (ref 4.22–5.81)
RDW: 11.6 % (ref 11.5–15.5)
WBC: 14.8 10*3/uL — ABNORMAL HIGH (ref 4.0–10.5)
nRBC: 0 % (ref 0.0–0.2)

## 2020-04-19 LAB — COMPREHENSIVE METABOLIC PANEL
ALT: 19 U/L (ref 0–44)
AST: 37 U/L (ref 15–41)
Albumin: 1.6 g/dL — ABNORMAL LOW (ref 3.5–5.0)
Alkaline Phosphatase: 76 U/L (ref 38–126)
Anion gap: 7 (ref 5–15)
BUN: 25 mg/dL — ABNORMAL HIGH (ref 6–20)
CO2: 35 mmol/L — ABNORMAL HIGH (ref 22–32)
Calcium: 8 mg/dL — ABNORMAL LOW (ref 8.9–10.3)
Chloride: 89 mmol/L — ABNORMAL LOW (ref 98–111)
Creatinine, Ser: 0.55 mg/dL — ABNORMAL LOW (ref 0.61–1.24)
GFR, Estimated: >60 mL/min (ref >60)
Glucose, Bld: 88 mg/dL (ref 70–99)
Potassium: 2.3 mmol/L — CL (ref 3.5–5.1)
Sodium: 131 mmol/L — ABNORMAL LOW (ref 135–145)
Total Bilirubin: 1 mg/dL (ref 0.3–1.2)
Total Protein: 4.9 g/dL — ABNORMAL LOW (ref 6.5–8.1)

## 2020-04-19 LAB — PHOSPHORUS: Phosphorus: <1 mg/dL — CL (ref 2.5–4.6)

## 2020-04-19 LAB — MAGNESIUM
Magnesium: 1.5 mg/dL — ABNORMAL LOW (ref 1.7–2.4)
Magnesium: 1.5 mg/dL — ABNORMAL LOW (ref 1.7–2.4)

## 2020-04-19 LAB — BASIC METABOLIC PANEL
Anion gap: 5 (ref 5–15)
BUN: 12 mg/dL (ref 6–20)
CO2: 36 mmol/L — ABNORMAL HIGH (ref 22–32)
Calcium: 7.7 mg/dL — ABNORMAL LOW (ref 8.9–10.3)
Chloride: 89 mmol/L — ABNORMAL LOW (ref 98–111)
Creatinine, Ser: 0.48 mg/dL — ABNORMAL LOW (ref 0.61–1.24)
GFR, Estimated: >60 mL/min (ref >60)
Glucose, Bld: 130 mg/dL — ABNORMAL HIGH (ref 70–99)
Potassium: 3.2 mmol/L — ABNORMAL LOW (ref 3.5–5.1)
Sodium: 130 mmol/L — ABNORMAL LOW (ref 135–145)

## 2020-04-19 SURGERY — CREATION, TRACHEOSTOMY
Anesthesia: General

## 2020-04-19 MED ORDER — POTASSIUM CHLORIDE 20 MEQ PO PACK: 40.0000 meq | PACK | Freq: Once | ORAL | Status: DC

## 2020-04-19 MED ORDER — POTASSIUM PHOSPHATES 15 MMOLE/5ML IV SOLN
30.0000 mmol | Freq: Once | INTRAVENOUS | Status: AC
  Administered 2020-04-19: 30 mmol via INTRAVENOUS
  Filled 2020-04-19: qty 10

## 2020-04-19 MED ORDER — MAGNESIUM SULFATE 2 GM/50ML IV SOLN
2.0000 g | Freq: Once | INTRAVENOUS | Status: AC
  Administered 2020-04-19: 2 g via INTRAVENOUS
  Filled 2020-04-19: qty 50

## 2020-04-19 MED ORDER — ACETAMINOPHEN 500 MG PO TABS
1000.0000 mg | ORAL_TABLET | Freq: Once | ORAL | Status: AC
  Administered 2020-04-19: 1000 mg via ORAL
  Filled 2020-04-19: qty 2

## 2020-04-19 MED ORDER — ADULT MULTIVITAMIN W/MINERALS CH
1.0000 | ORAL_TABLET | Freq: Every day | ORAL | Status: DC
  Administered 2020-04-19 – 2020-04-20 (×2): 1 via ORAL
  Filled 2020-04-19 (×2): qty 1

## 2020-04-19 MED ORDER — POTASSIUM CHLORIDE CRYS ER 20 MEQ PO TBCR
40.0000 meq | EXTENDED_RELEASE_TABLET | Freq: Four times a day (QID) | ORAL | Status: DC
  Administered 2020-04-19: 40 meq via ORAL
  Filled 2020-04-19: qty 2

## 2020-04-19 MED ORDER — POTASSIUM CHLORIDE CRYS ER 20 MEQ PO TBCR
40.0000 meq | EXTENDED_RELEASE_TABLET | Freq: Four times a day (QID) | ORAL | Status: AC
  Administered 2020-04-19 (×2): 40 meq via ORAL
  Filled 2020-04-19 (×2): qty 2

## 2020-04-19 MED ORDER — POTASSIUM CHLORIDE IN NACL 40-0.9 MEQ/L-% IV SOLN
INTRAVENOUS | Status: AC
  Filled 2020-04-19 (×2): qty 1000

## 2020-04-19 NOTE — Progress Notes (Signed)
PROGRESS NOTE    Mario Peters  ZOX:[id number] DOB: Nov 17, 1965 DOA: 04/17/2020 PCP: Patient, [name]  Brief Narrative: 55 year old male with history of alcohol and tobacco abuse, very poor historian presented to the ED with multiple complaints including weakness, ongoing dysphagia with very poor p.o. intake for 4 months, ongoing weight loss, hoarseness, last 1 to 2 weeks please had worsening cough, last couple of days has been so weak that he was unable to get out of bed.  In the ER he was noted to be hypertensive with blood pressures in the 70s which improved with fluid resuscitation, also noted to be hypoxic requiring supplemental O2. -CT chest noted bilateral pneumonia, CT soft tissue neck noted a hyperdense focus at the posterior aspect of the left vocal cord with soft tissue swelling raising concern for laryngeal CA   Assessment & Plan:   Severe Sepsis Aspiration pneumonia -Improving, blood pressure is more stable -continue cefepime -Blood cultures are negative -Continue IV fluids with potassium and K-Phos -SLP evaluation  Acute kidney injury -Resolved with hydration  Large laryngeal mass partially obstructing Severe dysphagia -Appreciate ENT input per Dr. 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 -Plan for tracheostomy under local anesthesia -Followed by general anesthesia for direct laryngoscopy with biopsy and esophagoscopy -SLP to follow-up Main family contact is sisterStanton Kidney Blansett: [phone]  Tobacco abuse -Counseled  Alcohol abuse Alcoholic liver disease -Mildly elevated bilirubin, low albumin -Ultrasound does not indicate cirrhosis yet however does have spider nevi on exam  Severe hypokalemia, hypophosphatemia and hypomagnesemia Refeeding syndrome-likely -Aggressively  replete potassium, phosphorus and mag -Repeat labs this afternoon  Severe protein calorie malnutrition -Albumin is 1.7, could be secondary to ongoing weight loss from dysphagia, possible malignancy, alcoholic liver disease also contributing  DVT prophylaxis:lovenox Code Status: Full Code Family Communication: none at bedside Disposition Plan:  Status is: Inpatient  Remains inpatient appropriate because:Inpatient level of care appropriate due to severity of illness   Dispo: The patient is from: Home              Anticipated d/c is to: Home              Patient currently is not medically stable to d/c.   Difficult to place patient No  Consultants:   ENT, pending   Procedures:   Antimicrobials:    Subjective: -Feels a little better today, continues to have cough and congestion, hoarseness and difficulty swallowing  Objective: Vitals:   04/19/20 0200 04/19/20 0352 04/19/20 0736 04/19/20 0757  BP:  116/70  (!) 107/55  Pulse:  80    Resp: 19 20  20   Temp:  98.1 F (36.7 C)  98.4 F (36.9 C)  TempSrc:  Oral  Oral  SpO2:  97%  92%  Weight:   51.6 kg   Height:   5\' 7"  (1.702 m)     Intake/Output Summary (Last 24 hours) at 04/19/2020 1202 Last data filed at 04/19/2020 0550 Gross per 24 hour  Intake 3441.76 ml  Output 1400 ml  Net 2041.76 ml   Filed Weights   04/17/20 2132 04/19/20 0736  Weight: 65.8 kg 51.6 kg    Examination:  General exam: Chronically ill disheveled cachectic male sitting up in bed, awake alert oriented to self and place, hoarse dysarthric speech HEENT: Dry mucous membranes, poor dentition, few spider nevi on his upper  chest CVS: S1-S2, regular rate rhythm Lungs: Poor air movement bilaterally, scattered rhonchi at the bases Abdomen: Soft, nontender, bowel sounds present Extremities: No edema Neuro: Moves all extremities, no localizing signs Psychiatry: Very poor insight and judgment.     Data Reviewed:   CBC: Recent Labs  Lab  04/17/20 1936 04/18/20 0010 04/19/20 0139  WBC 17.5*  --  14.8*  NEUTROABS 15.8*  --   --   HGB 16.5 14.6 12.0*  HCT 46.5 43.0 33.9*  MCV 86.3  --  87.8  PLT 244  --  371   Basic Metabolic Panel: Recent Labs  Lab 04/17/20 1936 04/18/20 0010 04/18/20 0700 04/19/20 0139  NA 128* 139 129* 131*  K 2.1* 3.4* 2.9* 2.3*  CL 82*  --  86* 89*  CO2 31  --  34* 35*  GLUCOSE 105*  --  98 88  BUN 79*  --  63* 25*  CREATININE 1.99*  --  1.11 0.55*  CALCIUM 7.5*  --  8.1* 8.0*  MG 2.1  --   --  1.5*  PHOS  --   --  2.1* <1.0*   GFR: Estimated Creatinine Clearance: 77 mL/min (A) (by C-G formula based on SCr of 0.55 mg/dL (L)). Liver Function Tests: Recent Labs  Lab 04/17/20 1936 04/18/20 0700 04/19/20 0139  AST 33 31 37  ALT 19 20 19   ALKPHOS 73 63 76  BILITOT 1.8* 1.3* 1.0  PROT 6.1* 5.2* 4.9*  ALBUMIN 2.1* 1.7* 1.6*   No results for input(s): LIPASE, AMYLASE in the last 168 hours. Recent Labs  Lab 04/17/20 2357  AMMONIA 25   Coagulation Profile: Recent Labs  Lab 04/17/20 1936  INR 1.4*   Cardiac Enzymes: Recent Labs  Lab 04/18/20 0700  CKTOTAL 117   BNP (last 3 results) No results for input(s): PROBNP in the last 8760 hours. HbA1C: No results for input(s): HGBA1C in the last 72 hours. CBG: No results for input(s): GLUCAP in the last 168 hours. Lipid Profile: No results for input(s): CHOL, HDL, LDLCALC, TRIG, CHOLHDL, LDLDIRECT in the last 72 hours. Thyroid Function Tests: No results for input(s): TSH, T4TOTAL, FREET4, T3FREE, THYROIDAB in the last 72 hours. Anemia Panel: No results for input(s): VITAMINB12, FOLATE, FERRITIN, TIBC, IRON, RETICCTPCT in the last 72 hours. Urine analysis:    Component Value Date/Time   COLORURINE AMBER (A) 04/17/2020 2133   APPEARANCEUR CLOUDY (A) 04/17/2020 2133   LABSPEC 1.014 04/17/2020 2133   PHURINE 5.0 04/17/2020 2133   GLUCOSEU NEGATIVE 04/17/2020 2133   HGBUR SMALL (A) 04/17/2020 2133   BILIRUBINUR NEGATIVE  04/17/2020 2133   KETONESUR NEGATIVE 04/17/2020 2133   PROTEINUR NEGATIVE 04/17/2020 2133   NITRITE NEGATIVE 04/17/2020 2133   LEUKOCYTESUR NEGATIVE 04/17/2020 2133   Sepsis Labs: @LABRCNTIP (procalcitonin:4,lacticidven:4)  ) Recent Results (from the past 240 hour(s))  Blood Culture (routine x 2)     Status: None (Preliminary result)   Collection Time: 04/17/20  7:35 PM   Specimen: BLOOD  Result Value Ref Range Status   Specimen Description BLOOD RIGHT ANTECUBITAL  Final   Special Requests   Final    BOTTLES DRAWN AEROBIC AND ANAEROBIC Blood Culture adequate volume   Culture   Final    NO GROWTH < 12 HOURS Performed at [facility], [address], [place], [place] [address]    Report Status PENDING  Incomplete  Resp Panel by RT-PCR (Flu A&B, Covid) Nasopharyngeal Swab     Status: None   Collection  Time: 04/17/20  9:33 PM   Specimen: Nasopharyngeal Swab; Nasopharyngeal(NP) swabs in vial transport medium  Result Value Ref Range Status   SARS Coronavirus 2 by RT PCR NEGATIVE NEGATIVE Final    Comment: (NOTE) SARS-CoV-2 target nucleic acids are NOT DETECTED.  The SARS-CoV-2 RNA is generally detectable in upper respiratory specimens during the acute phase of infection. The lowest concentration of SARS-CoV-2 viral copies this assay can detect is 138 copies/mL. A negative result does not preclude SARS-Cov-2 infection and should not be used as the sole basis for treatment or other patient management decisions. A negative result may occur with  improper specimen collection/handling, submission of specimen other than nasopharyngeal swab, presence of viral mutation(s) within the areas targeted by this assay, and inadequate number of viral copies(<138 copies/mL). A negative result must be combined with clinical observations, patient history, and epidemiological information. The expected result is Negative.  Fact Sheet for Patients:   EntrepreneurPulse.com.au  Fact Sheet for Healthcare Providers:  IncredibleEmployment.be  This test is no t yet approved or cleared by the [place] FDA and  has been authorized for detection and/or diagnosis of SARS-CoV-2 by FDA under an Emergency Use Authorization (EUA). This EUA will remain  in effect (meaning this test can be used) for the duration of the COVID-19 declaration under Section 564(b)(1) of the Act, 21 U.S.C.section 360bbb-3(b)(1), unless the authorization is terminated  or revoked sooner.       Influenza A by PCR NEGATIVE NEGATIVE Final   Influenza B by PCR NEGATIVE NEGATIVE Final    Comment: (NOTE) The Xpert Xpress SARS-CoV-2/FLU/RSV plus assay is intended as an aid in the diagnosis of influenza from Nasopharyngeal swab specimens and should not be used as a sole basis for treatment. Nasal washings and aspirates are unacceptable for Xpert Xpress SARS-CoV-2/FLU/RSV testing.  Fact Sheet for Patients: EntrepreneurPulse.com.au  Fact Sheet for Healthcare Providers: IncredibleEmployment.be  This test is not yet approved or cleared by the [place] FDA and has been authorized for detection and/or diagnosis of SARS-CoV-2 by FDA under an Emergency Use Authorization (EUA). This EUA will remain in effect (meaning this test can be used) for the duration of the COVID-19 declaration under Section 564(b)(1) of the Act, 21 U.S.C. section 360bbb-3(b)(1), unless the authorization is terminated or revoked.  Performed at [facility], [place] [address] [place], [place] [address]   Blood Culture (routine x 2)     Status: None (Preliminary result)   Collection Time: 04/17/20 10:07 PM   Specimen: BLOOD RIGHT FOREARM  Result Value Ref Range Status   Specimen Description BLOOD RIGHT FOREARM  Final   Special Requests   Final    BOTTLES DRAWN AEROBIC AND ANAEROBIC Blood Culture adequate volume    Culture   Final    NO GROWTH < 12 HOURS Performed at [facility], [place] [address], [place], [place] [address]    Report Status PENDING  Incomplete         Radiology Studies: CT SOFT TISSUE NECK WO CONTRAST  Result Date: 04/18/2020 CLINICAL DATA:  Fatigue, cough and weakness EXAM: CT NECK WITHOUT CONTRAST TECHNIQUE: Multidetector CT imaging of the neck was performed following the standard protocol without intravenous contrast. COMPARISON:  None. FINDINGS: Pharynx and larynx: There is a mildly hyperdense focus at the posterior aspect of the left vocal fold with associated soft tissue thickening (3:95). The epiglottis is normal. Pharynx is normal. Salivary glands: No inflammation, mass, or stone. Thyroid: Normal Lymph nodes: None enlarged  or abnormal density. Vascular: Negative. Limited intracranial: Negative. Visualized orbits: Negative. Mastoids and visualized paranasal sinuses: Mild left maxillary mucosal disease. Skeleton: No acute or aggressive process. Upper chest: Hazy opacities in the left apex. Mild emphysema. Other: None. IMPRESSION: 1. Mildly hyperdense focus at the posterior aspect of the left vocal fold with associated soft tissue thickening. 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 Correlation with direct visualization is recommended. 2. Hazy opacities in the left apex, which may indicate infection. Emphysema (ICD10-J43.9). Electronically Signed   By: Ulyses Jarred M.D.   On: 04/18/2020 00:28   CT CHEST WO CONTRAST  Result Date: 04/18/2020 CLINICAL DATA:  Follow-up abnormal chest x-ray EXAM: CT CHEST WITHOUT CONTRAST TECHNIQUE: Multidetector CT imaging of the chest was performed following the standard protocol without IV contrast. COMPARISON:  Chest x-ray from the previous day. FINDINGS: Cardiovascular: Somewhat limited due to lack of IV contrast. Mild atherosclerotic calcifications are noted in the thoracic aorta. No aneurysmal dilatation is noted. No cardiac enlargement is seen. Mild  coronary calcifications are noted. The pulmonary artery as visualized appears within normal limits. Mediastinum/Nodes: Thoracic inlet shows some mild fullness in the region of the vocal cords better evaluated on recent CT of the neck. Thyroid is within normal limits. Scattered small mediastinal lymph nodes are noted. The esophagus is within normal limits. Lungs/Pleura: Lungs are well aerated bilaterally with a combination of fine ground-glass opacities as well as more nodular tree-in-bud changes throughout the lower lobes left greater than right. These changes are consistent with diffuse pneumonia. There are worsened in the interval from the prior chest x-ray. Possibility of superimposed aspiration would deserve consideration as well as there is considerable inspissated material within the bronchial tree particularly in the left lower lobe. Upper Abdomen: Visualized upper abdomen shows no acute abnormality. Calcification along the margin of the spleen is noted. This may be related to prior trauma. Musculoskeletal: Degenerative changes of the thoracic spine are noted. IMPRESSION: Diffuse bilateral pulmonary parenchymal changes consistent with multifocal pneumonia. Possibility of superimposed aspiration deserves consideration as well given inspissated material within the lower lobe bronchial tree particularly on the left. Correlate clinically as to any immunocompromised state. Fullness in the region of the vocal cords incompletely evaluated on this exam. Aortic Atherosclerosis (ICD10-I70.0). Electronically Signed   By: Inez Catalina M.D.   On: 04/18/2020 00:20   DG Chest Port 1 View  Result Date: 04/17/2020 CLINICAL DATA:  Weight loss cough EXAM: PORTABLE CHEST 1 VIEW COMPARISON:  None. FINDINGS: Hyperinflation. Ill-defined left greater than right reticulonodular and ground-glass opacity. No pleural effusion. Normal heart size. No pneumothorax. IMPRESSION: Ill-defined left greater than right reticulonodular and  ground-glass opacity, suspicious for bilateral pneumonia, potentially atypical or viral pneumonia. Electronically Signed   By: Donavan Foil M.D.   On: 04/17/2020 19:14   US Abdomen Limited RUQ (LIVER/GB)  Result Date: 04/18/2020 CLINICAL DATA:  Alcohol abuse. EXAM: ULTRASOUND ABDOMEN LIMITED RIGHT UPPER QUADRANT COMPARISON:  None. FINDINGS: Gallbladder: Gallbladder sludge fills the majority of the lumen of the gallbladder. No gallstones or wall thickening visualized. No pericholecystic fluid. No sonographic Murphy sign noted by sonographer. Common bile duct: Diameter: 3 mm. Liver: No focal lesion identified. Within normal limits in parenchymal echogenicity. Portal vein is patent on color Doppler imaging with normal direction of blood flow towards the liver. Other: None. IMPRESSION: Gallbladder sludge fills the majority of the lumen of the gallbladder. No associate findings of acute cholecystitis or choledocholithiasis. Electronically Signed   By: Clelia Croft.D.  On: 04/18/2020 02:16        Scheduled Meds: . enoxaparin (LOVENOX) injection  40 mg Subcutaneous Daily  . feeding supplement  237 mL Oral BID BM  . nicotine  21 mg Transdermal Daily  . polyethylene glycol  17 g Oral Daily  . potassium chloride  40 mEq Oral QID   Continuous Infusions: . 0.9 % NaCl with KCl 40 mEq / L 88 mL/hr at 04/19/20 0550  . azithromycin Stopped (04/18/20 2307)  . ceFEPime (MAXIPIME) IV Stopped (04/19/20 0538)  . potassium PHOSPHATE IVPB (in mmol) 85 mL/hr at 04/19/20 1045     LOS: 2 days    Time spent: 86min  Domenic Polite, MD [facility]  04/19/2020, 12:02 PM

## 2020-04-19 NOTE — Anesthesia Preprocedure Evaluation (Deleted)
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          Cardiovascular negative cardio ROS       Neuro/Psych Seizures -,  negative psych ROS   GI/Hepatic negative GI ROS, (+)     substance abuse (1 case of beer daily)  alcohol use,   Endo/Other  negative endocrine ROS  Renal/GU negative Renal ROS  negative genitourinary   Musculoskeletal negative musculoskeletal ROS (+)   Abdominal   Peds  Hematology  (+) Blood dyscrasia, anemia , hct 33.9   Anesthesia Other Findings K 2.3 this AM- has been repleted Phos<1, mag 1.5  Reproductive/Obstetrics negative OB ROS                             Anesthesia Physical Anesthesia Plan  ASA: IV  Anesthesia Plan: General   Post-op Pain Management:    Induction: Intravenous  PONV Risk Score and Plan: 1 and Ondansetron, Dexamethasone, Treatment may vary due to age or medical condition and Midazolam  Airway Management Planned: Tracheostomy  Additional Equipment: None  Intra-op Plan:   Post-operative Plan: Post-operative intubation/ventilation  Informed Consent:   Plan Discussed with:   Anesthesia Plan Comments: (Awake tracheostomy followed by DL and EGD under GA once airway is secured  Will check istat in preop for potassium level)        Anesthesia Quick Evaluation

## 2020-04-19 NOTE — Progress Notes (Signed)
SLP Cancellation Note  Patient Details Name: Mario Peters MRN: [id number] DOB: July 25, 1965   Cancelled treatment:       Reason Eval/Treat Not Completed: Other (comment) (Per EMR, pt is scheduled to have trach placed. SLP will follow up for swallow evaluation post-trach.)  Anahla Bevis I. Hardin Negus, [place], [place] Office number [phone] Pager [phone]  Horton Marshall 04/19/2020, 11:07 AM

## 2020-04-19 NOTE — Progress Notes (Signed)
TRH night shift.  The patient's magnesium level was 1.5 mg/dL and phosphorus level was less than 1.0 mg/dL.  K-Phos 30 mmol IVPB and magnesium sulfate 2 g IVPB ordered.  Tennis Must, MD.

## 2020-04-19 NOTE — Progress Notes (Addendum)
Date and time results received: 04/19/20 0650  Test: Phosphorus  Critical Value: <1.0  Name of Provider Notified: Dr. Olevia Bowens New order received.

## 2020-04-19 NOTE — Progress Notes (Signed)
Sister [name] Timme: [phone]

## 2020-04-19 NOTE — Progress Notes (Signed)
Chaplain received a call from [place] that patient's family were present and wanted to talk about an AD. Chaplain responded and patient's wife and sister were at bedside. They wanted patient to be able to complete his AD before his surgery on Monday. Chaplain explained that we do not have notary's on the weekend or volunteers until Monday morning. Chaplain suggested they could get their own notary to come to the hospital during the weekend but would also have to have two witnesses who were not hospital employees. Notary and volunteers will be here at 8:00 AM on Monday morning if they cannot do that. Wife said they had not been told what time the surgery would be. Chaplain left number and is available if needed.    04/19/20 1830  Clinical Encounter Type  Visited With Patient and family together  Visit Type Other (Comment)  Referral From Nurse  Consult/Referral To Chaplain

## 2020-04-19 NOTE — Progress Notes (Signed)
Initial Nutrition Assessment  DOCUMENTATION CODES:  Severe malnutrition in context of acute illness/injury  INTERVENTION:   Continue diet per SLP recommendations.  Add MVI with minerals daily.  Add:  Magic cup TID with meals, each supplement provides 290 kcal and 9 grams of protein.  Carnation Instant Breakfast with whole milk TID with meals, each supplement provides 140 kcal and 5 grams of protein.  Snacks of [employer] Breakfast with whole milk TID.  Hormel shake BID with breakfast and dinner, each supplement provides 520 kcal and 22 grams of protein.  NUTRITION DIAGNOSIS:  Severe Malnutrition related to acute illness,dysphagia (laryngeal mass) as evidenced by severe fat depletion,severe muscle depletion,per patient/family report.  GOAL:  Patient will meet greater than or equal to 90% of their needs  MONITOR:  PO intake,Supplement acceptance,Labs  REASON FOR ASSESSMENT:  Consult Assessment of nutrition requirement/status  ASSESSMENT:  55 yo male with a PMH of EtOH and tobacco abuse who presents with severe sepsis, AKI, severe dysphagia 2/2 laryngeal mass, and aspiration PNA. Possible squamous cell carcinoma. SLP following.  Spoke with pt, pt's wife, and pt's sister at bedside. Pt's sister reports that pt has been unable to keep anything down for 2 months now. She reports, and RD witnessed, food and liquid regurgitation with everything he eats and drinks. He is also a picky eater and reports not liking many foods.  Pt's sister also endorsed a 20 lb weight loss in this 2 month period, 15% of his BW, which is very significant. Pt severely malnourished in both fat and muscle stores. If PO intake does not increase over the next few days, may need to consider Cortrak placement.  Pt does not supplements like Ensure or Boost or most fruits except for cantaloupe. RD to try Hormel shakes, Magic Cup, CIB, and snacks to increase caloric and protein intake. Also recommend adding  MVI with minerals daily to replete any losses.   Relevant Medications: miralax, Klor-Con 40 mEq QID, azithromycin, cefepime, Mag Sulfate, K-Phos 30 mmol in 5% dextrose Labs: reviewed; Na 131, K 2.3, Phos <1.0, Mag 1.5, Hgb 12  NUTRITION - FOCUSED PHYSICAL EXAM: Flowsheet Row Most Recent Value  Orbital Region Severe depletion  Upper Arm Region Severe depletion  Thoracic and Lumbar Region Severe depletion  Buccal Region Severe depletion  Temple Region Severe depletion  Clavicle Bone Region Severe depletion  Clavicle and Acromion Bone Region Severe depletion  Scapular Bone Region Severe depletion  Dorsal Hand Severe depletion  Patellar Region Severe depletion  Anterior Thigh Region Severe depletion  Posterior Calf Region Severe depletion  Edema (RD Assessment) None  Hair Reviewed  Eyes Reviewed  [glassy]  Mouth Reviewed  Skin Reviewed  Nails Reviewed     Diet Order:   Diet Order            Diet NPO time specified  Diet effective midnight           DIET DYS 3 Room service appropriate? Yes; Fluid consistency: Thin  Diet effective now                EDUCATION NEEDS:  Education needs have been addressed  Skin:  Skin Assessment: Reviewed RN Assessment  Last BM:  04/19/20 - Type 6  Height:  Ht Readings from Last 1 Encounters:  04/19/20 5\' 7"  (1.702 m)   Weight:  Wt Readings from Last 1 Encounters:  04/19/20 51.6 kg   Ideal Body Weight:  67.3 kg  BMI:  Body mass index is 17.82 kg/m.  Estimated Nutritional Needs:  Kcal:  1950-2150 Protein:  100-115 grams Fluid:  >2 L  Derrel Nip, RD, LDN Registered Dietitian After Hours/Weekend Pager # in [place]

## 2020-04-19 NOTE — Progress Notes (Addendum)
Date and time results received: 04/19/20 0430  Test: potassium  Critical Value: 2.3  Name of Provider Notified: Dr. Olevia Bowens  Orders Received: potassium 40 mEq po 4 times daily order received.

## 2020-04-19 NOTE — Progress Notes (Signed)
TRH night shift.  The nursing staff reports that the patient's most recent potassium level was 2.3 mmol/L.  The continuous LR infusion at 100 mL/h was changed to NS plus KCl 40 mEq at 88 mL/h x 24 hours.  K. Dur 40 mEq now and 40 mEq after breakfast ordered.  I have also added magnesium and phosphorus level to the morning labs.  Tennis Must, MD.

## 2020-04-20 DIAGNOSIS — E876 Hypokalemia: Secondary | ICD-10-CM | POA: Diagnosis not present

## 2020-04-20 DIAGNOSIS — F1010 Alcohol abuse, uncomplicated: Secondary | ICD-10-CM | POA: Diagnosis not present

## 2020-04-20 DIAGNOSIS — N179 Acute kidney failure, unspecified: Secondary | ICD-10-CM | POA: Diagnosis not present

## 2020-04-20 DIAGNOSIS — E43 Unspecified severe protein-calorie malnutrition: Secondary | ICD-10-CM | POA: Insufficient documentation

## 2020-04-20 DIAGNOSIS — A419 Sepsis, unspecified organism: Secondary | ICD-10-CM | POA: Diagnosis not present

## 2020-04-20 LAB — CBC
HCT: 35.2 % — ABNORMAL LOW (ref 39.0–52.0)
Hemoglobin: 12.1 g/dL — ABNORMAL LOW (ref 13.0–17.0)
MCH: 30.9 pg (ref 26.0–34.0)
MCHC: 34.4 g/dL (ref 30.0–36.0)
MCV: 90 fL (ref 80.0–100.0)
Platelets: 186 10*3/uL (ref 150–400)
RBC: 3.91 MIL/uL — ABNORMAL LOW (ref 4.22–5.81)
RDW: 11.6 % (ref 11.5–15.5)
WBC: 10.7 10*3/uL — ABNORMAL HIGH (ref 4.0–10.5)
nRBC: 0 % (ref 0.0–0.2)

## 2020-04-20 LAB — COMPREHENSIVE METABOLIC PANEL
ALT: 22 U/L (ref 0–44)
AST: 35 U/L (ref 15–41)
Albumin: 1.7 g/dL — ABNORMAL LOW (ref 3.5–5.0)
Alkaline Phosphatase: 56 U/L (ref 38–126)
Anion gap: 8 (ref 5–15)
BUN: 7 mg/dL (ref 6–20)
CO2: 32 mmol/L (ref 22–32)
Calcium: 7.2 mg/dL — ABNORMAL LOW (ref 8.9–10.3)
Chloride: 89 mmol/L — ABNORMAL LOW (ref 98–111)
Creatinine, Ser: 0.4 mg/dL — ABNORMAL LOW (ref 0.61–1.24)
GFR, Estimated: >60 mL/min (ref >60)
Glucose, Bld: 91 mg/dL (ref 70–99)
Potassium: 3.4 mmol/L — ABNORMAL LOW (ref 3.5–5.1)
Sodium: 129 mmol/L — ABNORMAL LOW (ref 135–145)
Total Bilirubin: 0.5 mg/dL (ref 0.3–1.2)
Total Protein: 4.9 g/dL — ABNORMAL LOW (ref 6.5–8.1)

## 2020-04-20 LAB — PHOSPHORUS: Phosphorus: 2.9 mg/dL (ref 2.5–4.6)

## 2020-04-20 LAB — MAGNESIUM: Magnesium: 1.3 mg/dL — ABNORMAL LOW (ref 1.7–2.4)

## 2020-04-20 MED ORDER — POTASSIUM CHLORIDE CRYS ER 20 MEQ PO TBCR: 40.0000 meq | EXTENDED_RELEASE_TABLET | Freq: Three times a day (TID) | ORAL | Status: DC

## 2020-04-20 MED ORDER — SODIUM CHLORIDE 0.9 % IV SOLN: INTRAVENOUS | Status: DC

## 2020-04-20 MED ORDER — MAGNESIUM SULFATE 2 GM/50ML IV SOLN
2.0000 g | Freq: Once | INTRAVENOUS | Status: AC
  Administered 2020-04-20: 2 g via INTRAVENOUS
  Filled 2020-04-20: qty 50

## 2020-04-20 MED ORDER — POTASSIUM CHLORIDE CRYS ER 20 MEQ PO TBCR
20.0000 meq | EXTENDED_RELEASE_TABLET | Freq: Once | ORAL | Status: AC
  Administered 2020-04-20: 20 meq via ORAL
  Filled 2020-04-20: qty 1

## 2020-04-20 MED ORDER — POTASSIUM CHLORIDE 10 MEQ/100ML IV SOLN
10.0000 meq | INTRAVENOUS | Status: AC
  Administered 2020-04-20 (×4): 10 meq via INTRAVENOUS
  Filled 2020-04-20 (×4): qty 100

## 2020-04-20 NOTE — Plan of Care (Signed)
  Problem: Activity: Goal: Ability to tolerate increased activity will improve Outcome: Progressing   Problem: Clinical Measurements: Goal: Ability to maintain a body temperature in the normal range will improve Outcome: Progressing   Problem: Respiratory: Goal: Ability to maintain adequate ventilation will improve Outcome: Progressing Goal: Ability to maintain a clear airway will improve Outcome: Progressing   

## 2020-04-20 NOTE — Progress Notes (Signed)
PROGRESS NOTE    Mario Peters  VEL:[id number] DOB: Dec 12, 1965 DOA: 04/17/2020 PCP: Patient, [name]  Brief Narrative: 55 year old male with history of alcohol and tobacco abuse, very poor historian presented to the ED with multiple complaints including weakness, ongoing dysphagia with very poor p.o. intake for 4 months, ongoing weight loss, hoarseness, last 1 to 2 weeks please had worsening cough, last couple of days has been so weak that he was unable to get out of bed.  In the ER he was noted to be hypertensive with blood pressures in the 70s which improved with fluid resuscitation, also noted to be hypoxic requiring supplemental O2. 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 recommended, continue ice chips, sips of water only until tracheostomy, will need PEG tube thereafter Main family contact is sisterStanton Kidney Pugh: [phone]  Tobacco abuse -Counseled  Alcohol abuse Alcoholic liver disease -Mildly elevated bilirubin, low albumin -Ultrasound does not indicate  cirrhosis yet however does have spider nevi on exam  Severe hypokalemia, hypophosphatemia and hypomagnesemia Refeeding syndrome-likely -Aggressively repleted potassium, phosphorus and mag -IV mag and potassium again today  Severe protein calorie malnutrition -Albumin is 1.7, could be secondary to ongoing weight loss from dysphagia, possible malignancy, alcoholic liver disease also contributing  DVT prophylaxis:lovenox Code Status: Full Code Family Communication: none at bedside, discussed with patient's Sister Boyce Medici and wife yesterday Disposition Plan:  Status is: Inpatient  Remains inpatient appropriate because:Inpatient level of care appropriate due to severity of illness   Dispo: The patient is from: Home              Anticipated d/c is to: Home              Patient currently is not medically stable to d/c.   Difficult to place patient No  Consultants:   ENT, pending   Procedures:   Antimicrobials:    Subjective: -Upset about not getting enough fluids Objective: Vitals:   04/20/20 0429 04/20/20 0545 04/20/20 0829 04/20/20 1137  BP:  125/67 (!) 117/57 107/61  Pulse: 82 90 74 68  Resp: (!) 21 18 (!) 21 20  Temp:  97.8 F (36.6 C) 98.1 F (36.7 C) 97.9 F (36.6 C)  TempSrc:  Oral Oral Oral  SpO2: (!) 86% 97% 96% 98%  Weight:      Height:        Intake/Output Summary (Last 24 hours) at 04/20/2020 1143 Last data filed at 04/20/2020 1024 Gross per 24 hour  Intake 251.29 ml  Output 1400 ml  Net -1148.71 ml   Filed Weights   04/17/20 2132 04/19/20 0736  Weight: 65.8 kg 51.6 kg    Examination:  General exam: Chronically ill disheveled  male sitting up in bed, awake alert oriented to self and place, course with dysarthric speech HEENT: Positive mild stridor, poor dentition , spider nevi on his upper chest CVS: S1-S2, regular rate rhythm Lungs: Poor air movement bilaterally, few scattered rhonchi at the bases Abdomen: Soft, nontender, bowel sounds  present Extremities: No edema Neuro: Moves all extremities, no localizing signs Psychiatry: Very poor insight and judgment.     Data Reviewed:   CBC: Recent Labs  Lab 04/17/20 1936 04/18/20 0010 04/19/20 0139 04/20/20 0110  WBC 17.5*  --  14.8* 10.7*  NEUTROABS 15.8*  --   --   --   HGB 16.5 14.6 12.0* 12.1*  HCT 46.5 43.0 33.9* 35.2*  MCV 86.3  --  87.8 90.0  PLT 244  --  200 009   Basic Metabolic Panel: Recent Labs  Lab 04/17/20 1936 04/18/20 0010 04/18/20 0700 04/19/20 0139 04/19/20 1536 04/20/20 0110  NA 128* 139 129* 131* 130* 129*  K 2.1* 3.4* 2.9* 2.3* 3.2* 3.4*  CL 82*  --  86* 89* 89* 89*  CO2 31  --  34* 35* 36* 32  GLUCOSE 105*  --  98 88 130* 91  BUN 79*  --  63* 25* 12 7  CREATININE 1.99*  --  1.11 0.55* 0.48* 0.40*  CALCIUM 7.5*  --  8.1* 8.0* 7.7* 7.2*  MG 2.1  --   --  1.5* 1.5* 1.3*  PHOS  --   --  2.1* <1.0*  --  2.9   GFR: Estimated Creatinine Clearance: 77 mL/min (A) (by C-G formula based on SCr of 0.4 mg/dL (L)). Liver Function Tests: Recent Labs  Lab 04/17/20 1936 04/18/20 0700 04/19/20 0139 04/20/20 0110  AST 33 31 37 35  ALT 19 20 19 22   ALKPHOS 73 63 76 56  BILITOT 1.8* 1.3* 1.0 0.5  PROT 6.1* 5.2* 4.9* 4.9*  ALBUMIN 2.1* 1.7* 1.6* 1.7*   No results for input(s): LIPASE, AMYLASE in the last 168 hours. Recent Labs  Lab 04/17/20 2357  AMMONIA 25   Coagulation Profile: Recent Labs  Lab 04/17/20 1936  INR 1.4*   Cardiac Enzymes: Recent Labs  Lab 04/18/20 0700  CKTOTAL 117   BNP (last 3 results) No results for input(s): PROBNP in the last 8760 hours. HbA1C: No results for input(s): HGBA1C in the last 72 hours. CBG: No results for input(s): GLUCAP in the last 168 hours. Lipid Profile: No results for input(s): CHOL, HDL, LDLCALC, TRIG, CHOLHDL, LDLDIRECT in the last 72 hours. Thyroid Function Tests: No results for input(s): TSH, T4TOTAL, FREET4, T3FREE, THYROIDAB in the last 72 hours. Anemia Panel: No results  for input(s): VITAMINB12, FOLATE, FERRITIN, TIBC, IRON, RETICCTPCT in the last 72 hours. Urine analysis:    Component Value Date/Time   COLORURINE AMBER (A) 04/17/2020 2133   APPEARANCEUR CLOUDY (A) 04/17/2020 2133   LABSPEC 1.014 04/17/2020 2133   PHURINE 5.0 04/17/2020 2133   GLUCOSEU NEGATIVE 04/17/2020 2133   HGBUR SMALL (A) 04/17/2020 2133   BILIRUBINUR NEGATIVE 04/17/2020 2133   KETONESUR NEGATIVE 04/17/2020 2133   PROTEINUR NEGATIVE 04/17/2020 2133   NITRITE NEGATIVE 04/17/2020 2133   LEUKOCYTESUR NEGATIVE 04/17/2020 2133   Sepsis Labs: @LABRCNTIP (procalcitonin:4,lacticidven:4)  ) Recent Results (from the past 240 hour(s))  Blood Culture (routine x 2)     Status: None (Preliminary result)   Collection Time: 04/17/20  7:35 PM   Specimen: BLOOD  Result Value Ref Range Status   Specimen Description BLOOD RIGHT ANTECUBITAL  Final  Special Requests   Final    BOTTLES DRAWN AEROBIC AND ANAEROBIC Blood Culture adequate volume   Culture   Final    NO GROWTH 3 DAYS Performed at [facility], [place] [address] [place], [place] [address]    Report Status PENDING  Incomplete  Resp Panel by RT-PCR (Flu A&B, Covid) Nasopharyngeal Swab     Status: None   Collection Time: 04/17/20  9:33 PM   Specimen: Nasopharyngeal Swab; Nasopharyngeal(NP) swabs in vial transport medium  Result Value Ref Range Status   SARS Coronavirus 2 by RT PCR NEGATIVE NEGATIVE Final    Comment: (NOTE) SARS-CoV-2 target nucleic acids are NOT DETECTED.  The SARS-CoV-2 RNA is generally detectable in upper respiratory specimens during the acute phase of infection. The lowest concentration of SARS-CoV-2 viral copies this assay can detect is 138 copies/mL. A negative result does not preclude SARS-Cov-2 infection and should not be used as the sole basis for treatment or other patient management decisions. A negative result may occur with  improper specimen collection/handling, submission of specimen  other than nasopharyngeal swab, presence of viral mutation(s) within the areas targeted by this assay, and inadequate number of viral copies(<138 copies/mL). A negative result must be combined with clinical observations, patient history, and epidemiological information. The expected result is Negative.  Fact Sheet for Patients:  EntrepreneurPulse.com.au  Fact Sheet for Healthcare Providers:  IncredibleEmployment.be  This test is no t yet approved or cleared by the [place] FDA and  has been authorized for detection and/or diagnosis of SARS-CoV-2 by FDA under an Emergency Use Authorization (EUA). This EUA will remain  in effect (meaning this test can be used) for the duration of the COVID-19 declaration under Section 564(b)(1) of the Act, 21 U.S.C.section 360bbb-3(b)(1), unless the authorization is terminated  or revoked sooner.       Influenza A by PCR NEGATIVE NEGATIVE Final   Influenza B by PCR NEGATIVE NEGATIVE Final    Comment: (NOTE) The Xpert Xpress SARS-CoV-2/FLU/RSV plus assay is intended as an aid in the diagnosis of influenza from Nasopharyngeal swab specimens and should not be used as a sole basis for treatment. Nasal washings and aspirates are unacceptable for Xpert Xpress SARS-CoV-2/FLU/RSV testing.  Fact Sheet for Patients: EntrepreneurPulse.com.au  Fact Sheet for Healthcare Providers: IncredibleEmployment.be  This test is not yet approved or cleared by the [place] FDA and has been authorized for detection and/or diagnosis of SARS-CoV-2 by FDA under an Emergency Use Authorization (EUA). This EUA will remain in effect (meaning this test can be used) for the duration of the COVID-19 declaration under Section 564(b)(1) of the Act, 21 U.S.C. section 360bbb-3(b)(1), unless the authorization is terminated or revoked.  Performed at [facility], [place] [address], [place],  [place] [address]   Blood Culture (routine x 2)     Status: None (Preliminary result)   Collection Time: 04/17/20 10:07 PM   Specimen: BLOOD RIGHT FOREARM  Result Value Ref Range Status   Specimen Description BLOOD RIGHT FOREARM  Final   Special Requests   Final    BOTTLES DRAWN AEROBIC AND ANAEROBIC Blood Culture adequate volume   Culture   Final    NO GROWTH 3 DAYS Performed at [facility], [place] [address], [place], [place] [address]    Report Status PENDING  Incomplete         Radiology Studies: No results found.      Scheduled Meds: . enoxaparin (LOVENOX) injection  40 mg  Subcutaneous Daily  . feeding supplement  237 mL Oral BID BM  . nicotine  21 mg Transdermal Daily   Continuous Infusions: . azithromycin Stopped (04/19/20 2329)  . ceFEPime (MAXIPIME) IV 2 g (04/20/20 0509)  . potassium chloride 10 mEq (04/20/20 1040)     LOS: 3 days    Time spent: 68min  Domenic Polite, MD [facility]  04/20/2020, 11:43 AM

## 2020-04-20 NOTE — Progress Notes (Signed)
Speech Language Pathology Treatment: Dysphagia  Patient Details Name: Mario Peters MRN: [id number] DOB: Jul 25, 1965 Today's Date: 04/20/2020 Time: 8453-6468 SLP Time Calculation (min) (ACUTE ONLY): 22 min  Assessment / Plan / Recommendation Clinical Impression  Second session completed as pt indicated SLP needed to speak to his family re: care plan and needed to inform pt to pland of care after SLP discussed with MD.  Imelda Pillow members included brother Theadore Nan and sister-in-law Maudie Mercury.  Given pt's decreased medical literacy, informed him and his family to concern for overt aspiration impacting lung status and nutrition.  Pt was initially resistant stating "Aww man, but I want to eat".  After advised to "some of the food and drink is going into the air tube", pt understood reasoning for recommendations.  Pt then stated "I'm scared now" - advised role if to educate pt and that he has been having dysphagia/aspiration for quite some time - that has progressed.  Goal is to maintain as much health as possible pending his trach placement, especially given his CT findigns, pt reports of coughing up green phlegm, blood, etc.    Advised pt to consume ice chips and water AFTER ORAL CARE to help in managing secretions, continue use of swallow musculature and for hygiene.  He was agreeable to plan after discussion. Mr Cragg will likely require long term means of nutrition if treatment is pursued given degree of mass and dysphagia.  Encouraged pt to continue to cough/expectorate after swallowing ice and water as needed.    Using teach back with moderate cues, pt able to verbalize precautions - but will require reinforcement due to low medical literacy.  Prefer medications be given via IV as pt reports he will be unable to swallow them.  Family and pt informed to recommendations and provided with written signs/cues and using teach back, all reported understanding.    Will follow up after pt's scheduled Monday April 22, 2020 trach placement = likely to follow up Tuesday April 23, 2020.  HPI HPI: Pt is a 55 y.o. male with medical history significant of EtOH abuse,  tobacco abuse, remote hx of seizures. He presented via EMS due to inability to get out of bed, weight loss, and productive cough x4 months and increased difficulty swallowing. CXR 3/30: Ill-defined left greater than right reticulonodular and ground-glass opacity, suspicious for bilateral pneumonia, potentially atypical or viral pneumonia. CT soft tissue: Mildly hyperdense focus at the posterior aspect of the left vocal fold with associated soft tissue thickening. 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 airway is partially compromised.Trach recommended.  Pt was to have trach placed on Friday 4/1 but it was cancelled, is now tentatively scheduled for [place] 4/4.  Swallow evaluation ordered and notes indicate pt overtly coughing while consuming potassium today.      SLP Plan  Continue with current plan of care       Recommendations  Medication Administration: Via alternative means Compensations: Slow rate;Small sips/bites                Oral Care Recommendations: Other (Comment) (TID) Follow up Recommendations: Other (comment) SLP Visit Diagnosis: Dysphagia, pharyngeal phase (R13.13);Dysphagia, pharyngoesophageal phase (R13.14) Plan: Continue with current plan of care       GO  Mario Peters 04/20/2020, 11:43 AM  Mario Lime, MS [facility] SLP Acute Rehab Services Office [phone] Pager [phone]

## 2020-04-20 NOTE — Progress Notes (Signed)
Pt took oral potassium but had a bad coughing spell and unable to determine how much oral potassium was ingested. Pt states happens to them.

## 2020-04-20 NOTE — Plan of Care (Signed)
  Problem: Activity: Goal: Ability to tolerate increased activity will improve Outcome: Progressing   Problem: Clinical Measurements: Goal: Ability to maintain a body temperature in the normal range will improve Outcome: Progressing   Problem: Respiratory: Goal: Ability to maintain adequate ventilation will improve Outcome: Progressing Goal: Ability to maintain a clear airway will improve Outcome: Progressing   Problem: Education: Goal: Knowledge of General Education information will improve Description: Including pain rating scale, medication(s)/side effects and non-pharmacologic comfort measures Outcome: Progressing   Problem: Health Behavior/Discharge Planning: Goal: Ability to manage health-related needs will improve Outcome: Progressing   Problem: Clinical Measurements: Goal: Ability to maintain clinical measurements within normal limits will improve Outcome: Progressing Goal: Will remain free from infection Outcome: Progressing Goal: Diagnostic test results will improve Outcome: Progressing Goal: Respiratory complications will improve Outcome: Progressing Goal: Cardiovascular complication will be avoided Outcome: Progressing   Problem: Activity: Goal: Risk for activity intolerance will decrease Outcome: Progressing   Problem: Nutrition: Goal: Adequate nutrition will be maintained Outcome: Progressing   Problem: Coping: Goal: Level of anxiety will decrease Outcome: Progressing   Problem: Elimination: Goal: Will not experience complications related to bowel motility Outcome: Progressing Goal: Will not experience complications related to urinary retention Outcome: Progressing   Problem: Pain Managment: Goal: General experience of comfort will improve Outcome: Progressing   Problem: Safety: Goal: Ability to remain free from injury will improve Outcome: Progressing   Problem: Skin Integrity: Goal: Risk for impaired skin integrity will decrease Outcome:  Progressing   Problem: Nutrition Goal: Patient maintains adequate hydration Outcome: Progressing Goal: Patient maintains weight Outcome: Progressing Goal: Patient/Family demonstrates understanding of diet Outcome: Progressing Goal: Patient/Family independently completes tube feeding Outcome: Progressing Goal: Patient will have no more than 5 lb weight change during LOS Outcome: Progressing Goal: Patient will utilize adaptive techniques to administer nutrition Outcome: Progressing Goal: Patient will verbalize dietary restrictions Outcome: Progressing

## 2020-04-20 NOTE — Progress Notes (Signed)
[facility] night shift.  The nursing staff has notified me about the patient's electrolyte results this morning.  Sodium was 129 and potassium 3.4 mmol/L.  He still receiving NS plus KCl 40 mEq at 88 mL/h, but only has a few more hours of this infusion left.  Magnesium was 1.3 and calcium 7.2 mg/dL.  Calcium corrected to a normal level when correcting to an albumin level of 1.7 g/dL.  Magnesium sulfate 2 g IVPB and KCl 20 mEq p.o. x1 dose ordered.  Tennis Must, MD.

## 2020-04-20 NOTE — Evaluation (Signed)
Clinical/Bedside Swallow Evaluation Patient Details  Name: Mario Peters MRN: [id number] Date of Birth: Aug 26, 1965  Today's Date: 04/20/2020 Time: SLP Start Time (ACUTE ONLY): 0941 SLP Stop Time (ACUTE ONLY): 1005 SLP Time Calculation (min) (ACUTE ONLY): 24 min  Past Medical History:  Past Medical History:  Diagnosis Date  . Seizures ([place])    Past Surgical History: History reviewed. No pertinent surgical history. HPI:  Pt is a 55 y.o. male with medical history significant of EtOH abuse,  tobacco abuse, remote hx of seizures. He presented via EMS due to inability to get out of bed, weight loss, and productive cough x4 months and increased difficulty swallowing. CXR 3/30: Ill-defined left greater than right reticulonodular and ground-glass opacity, suspicious for bilateral pneumonia, potentially atypical or viral pneumonia. CT soft tissue: Mildly hyperdense focus at the posterior aspect of the left vocal fold with associated soft tissue thickening. 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 airway is partially compromised.Trach recommended.  Pt was to have trach placed on Friday 4/1 but it was cancelled, is now tentatively scheduled for [place] 4/4.  Swallow evaluation ordered and notes indicate pt overtly coughing while consuming potassium today.   Assessment / Plan / Recommendation Clinical Impression  Patient presents with clinical indications of gross pharyngeal dysphagia with decreased ability to manage even secretions.  His vocal quality is wet at baseline without ability to maintain clearance despite coughing/throat clearing and expectorating.  Palpation revealed right side of neck to be larger in size compared to left - concerning for changes in neck structure.  SLP noted pt's  cup on his bedside table full of secretions mixed with Orange juice that pt has been consuming.  In addition, a medicine cup full of crushed medicine with applesauce is present and pt states "You might as well throw that away, because I can't swallow that."  Oral mech exam, + for ? whitish areas on right posterior oral cavity near decayed tooth and in buccal region /? secretions?  SLP observed pt consuming ice chips, water via cup/straw, 1/3 spoon of applesauce.  Immediately post-swallow, pt is coughing and expectorating secretions - likely thinned by aspirates.  Applesauce bolus required 4 swallows with immediate cough/expectoration of applesauce and secretions.  This pt clinically appears to be aspirating ALL of his intake and given findings on chest image, pulmonary ramifications are concerning.  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 airway is partially compromised". Do not recommend MBS at this time, as pt is to obtain a trach on Monday 4/4.  SLP advised pt to concerns and Advised pt would return after discussion with hospitalist re: his care plan.  Reached out to MD, Dr Broadus John, with concerns and options of continue po- VS- npo x water, ice - change meds to IV if able.  MD agreed with place for NPO at this time. SLP Visit Diagnosis: Dysphagia, pharyngeal phase (R13.13);Dysphagia, pharyngoesophageal phase (R13.14)    Aspiration Risk  Severe aspiration risk;Risk for inadequate nutrition/hydration    Diet Recommendation NPO;Ice chips PRN after oral care;Free water protocol after oral care   Liquid Administration via: Cup;Straw;Spoon Medication Administration: Via alternative means Supervision: Patient able to self feed Compensations: Slow  rate;Small sips/bites Postural Changes: Seated upright at 90 degrees;Remain upright for at  least 30 minutes after po intake    Other  Recommendations Oral Care Recommendations: Other (Comment) (oral care at least TID) Other Recommendations: Have oral suction available   Follow up Recommendations Other (comment) (TBD, ? HH?)      Frequency and Duration min 2x/week  2 weeks       Prognosis   Good for goals, Guarded for full nutrition via po initially     Swallow Study   General HPI: Pt is a 55 y.o. male with medical history significant of EtOH abuse,  tobacco abuse, remote hx of seizures. He presented via EMS due to inability to get out of bed, weight loss, and productive cough x4 months and increased difficulty swallowing. CXR 3/30: Ill-defined left greater than right reticulonodular and ground-glass opacity, suspicious for bilateral pneumonia, potentially atypical or viral pneumonia. CT soft tissue: Mildly hyperdense focus at the posterior aspect of the left vocal fold with associated soft tissue thickening. 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 airway is partially compromised.Trach recommended.  Pt was to have trach placed on Friday 4/1 but it was cancelled, is now tentatively scheduled for [place] 4/4.  Swallow evaluation ordered and notes indicate pt overtly coughing while consuming potassium today. Type of Study: Bedside Swallow Evaluation Previous Swallow Assessment: none Diet Prior to this Study: Dysphagia 3 (soft);Thin liquids Respiratory Status: Nasal cannula (5 liters) History of Recent Intubation: No Behavior/Cognition: Alert Oral Cavity Assessment: Excessive secretions;Other (comment) (several dentition appears with caries, ? right posterior buccal region and gum around decayed tooth - white sploches?? ? secretions?) Oral Care Completed by SLP: Other  (Comment) (requsted NT obtain toothbrush/basin for pt to use for oral care TID) Oral Cavity - Dentition: Poor condition Vision: Functional for self-feeding Self-Feeding Abilities: Able to feed self Patient Positioning: Upright in bed Baseline Vocal Quality: Wet;Hoarse Volitional Cough: Strong;Congested;Other (Comment) (congested at times) Volitional Swallow: Able to elicit (but pt immediately coughs and expectorates post-swallow)    Oral/Motor/Sensory Function Overall Oral Motor/Sensory Function: Within functional limits   Ice Chips Ice chips: Impaired Presentation: Cup;Self Fed Pharyngeal Phase Impairments: Multiple swallows;Cough - Immediate;Cough - Delayed;Throat Clearing - Immediate;Other (comments);Decreased hyoid-laryngeal movement;Wet Vocal Quality Other Comments: expectoration of viscous secretions   Thin Liquid Thin Liquid: Impaired Presentation: Cup;Straw Pharyngeal  Phase Impairments: Decreased hyoid-laryngeal movement;Multiple swallows;Wet Vocal Quality;Throat Clearing - Immediate;Cough - Immediate;Cough - Delayed Other Comments: expectoration of secretions -likley thinned down by water    Nectar Thick Nectar Thick Liquid: Not tested   Honey Thick Honey Thick Liquid: Not tested   Puree Puree: Impaired Presentation: Self Fed;Spoon (1/3 spoon size bolus) Pharyngeal Phase Impairments: Decreased hyoid-laryngeal movement;Multiple swallows;Wet Vocal Quality;Throat Clearing - Immediate;Cough - Immediate Other Comments: cough and expectoration of applesauce mixed with secretions   Solid     Solid: Not tested      Macario Golds 04/20/2020,11:26 AM   Kathleen Lime, MS [place] Office [phone] Pager [phone]

## 2020-04-20 NOTE — Progress Notes (Signed)
Pharmacy Antibiotic Note  Mario Peters is a 55 y.o. male admitted on 04/17/2020 with sepsis and likely aspiration pneumonia.  Pharmacy has been consulted for cefepime dosing. This is day 4 of therapy with cefepime. Patient is also on azithromycin (day 4). Pt remains afebrile with WBC of 10.1. Scr 0.4 with CrCl 61ml/min.    Plan: Continue cefepime to 2 g IV every 8 hours Monitor renal function, Cx and clinical progression to narrow F/u length of therapy   Height: 5\' 7"  (170.2 cm) Weight: 51.6 kg (113 lb 12.8 oz) IBW/kg (Calculated) : 66.1  Temp (24hrs), Avg:97.8 F (36.6 C), Min:97.5 F (36.4 C), Max:98.1 F (36.7 C)  Recent Labs  Lab 04/17/20 1936 04/17/20 2206 04/18/20 0100 04/18/20 0300 04/18/20 0700 04/19/20 0139 04/19/20 1536 04/20/20 0110  WBC 17.5*  --   --   --   --  14.8*  --  10.7*  CREATININE 1.99*  --   --   --  1.11 0.55* 0.48* 0.40*  LATICACIDVEN 5.0* 3.4* 1.3 1.6  --   --   --   --     Estimated Creatinine Clearance: 77 mL/min (A) (by C-G formula based on SCr of 0.4 mg/dL (L)).    No Known Allergies  Antimicrobials this admission: Vancomycin 3/30>3/31 Cefepime 3/30> Azithromycin 3/30>  Dose adjustments this admission: N/A  Microbiology results: 3/30 Bcx:no growth 2 days 3/30 resp panel: neg   Thank you for allowing pharmacy to be a part of this patient's care.  Wilson Singer, PharmD PGY1 Pharmacy Resident 04/20/2020 10:09 AM

## 2020-04-21 DIAGNOSIS — E876 Hypokalemia: Secondary | ICD-10-CM | POA: Diagnosis not present

## 2020-04-21 DIAGNOSIS — A419 Sepsis, unspecified organism: Secondary | ICD-10-CM | POA: Diagnosis not present

## 2020-04-21 DIAGNOSIS — N179 Acute kidney failure, unspecified: Secondary | ICD-10-CM | POA: Diagnosis not present

## 2020-04-21 DIAGNOSIS — F1010 Alcohol abuse, uncomplicated: Secondary | ICD-10-CM | POA: Diagnosis not present

## 2020-04-21 LAB — COMPREHENSIVE METABOLIC PANEL
ALT: 19 U/L (ref 0–44)
AST: 25 U/L (ref 15–41)
Albumin: 1.8 g/dL — ABNORMAL LOW (ref 3.5–5.0)
Alkaline Phosphatase: 56 U/L (ref 38–126)
Anion gap: 6 (ref 5–15)
BUN: <5 mg/dL — ABNORMAL LOW (ref 6–20)
CO2: 37 mmol/L — ABNORMAL HIGH (ref 22–32)
Calcium: 7.8 mg/dL — ABNORMAL LOW (ref 8.9–10.3)
Chloride: 89 mmol/L — ABNORMAL LOW (ref 98–111)
Creatinine, Ser: 0.47 mg/dL — ABNORMAL LOW (ref 0.61–1.24)
GFR, Estimated: >60 mL/min (ref >60)
Glucose, Bld: 76 mg/dL (ref 70–99)
Potassium: 3.4 mmol/L — ABNORMAL LOW (ref 3.5–5.1)
Sodium: 132 mmol/L — ABNORMAL LOW (ref 135–145)
Total Bilirubin: 0.8 mg/dL (ref 0.3–1.2)
Total Protein: 5.2 g/dL — ABNORMAL LOW (ref 6.5–8.1)

## 2020-04-21 LAB — BASIC METABOLIC PANEL
Anion gap: 11 (ref 5–15)
BUN: <5 mg/dL — ABNORMAL LOW (ref 6–20)
CO2: 35 mmol/L — ABNORMAL HIGH (ref 22–32)
Calcium: 8 mg/dL — ABNORMAL LOW (ref 8.9–10.3)
Chloride: 86 mmol/L — ABNORMAL LOW (ref 98–111)
Creatinine, Ser: 0.57 mg/dL — ABNORMAL LOW (ref 0.61–1.24)
GFR, Estimated: >60 mL/min (ref >60)
Glucose, Bld: 66 mg/dL — ABNORMAL LOW (ref 70–99)
Potassium: 3.5 mmol/L (ref 3.5–5.1)
Sodium: 132 mmol/L — ABNORMAL LOW (ref 135–145)

## 2020-04-21 LAB — CBC
HCT: 37.9 % — ABNORMAL LOW (ref 39.0–52.0)
Hemoglobin: 12.7 g/dL — ABNORMAL LOW (ref 13.0–17.0)
MCH: 30.5 pg (ref 26.0–34.0)
MCHC: 33.5 g/dL (ref 30.0–36.0)
MCV: 91.1 fL (ref 80.0–100.0)
Platelets: 181 10*3/uL (ref 150–400)
RBC: 4.16 MIL/uL — ABNORMAL LOW (ref 4.22–5.81)
RDW: 11.5 % (ref 11.5–15.5)
WBC: 12.2 10*3/uL — ABNORMAL HIGH (ref 4.0–10.5)
nRBC: 0 % (ref 0.0–0.2)

## 2020-04-21 LAB — MAGNESIUM: Magnesium: 1.4 mg/dL — ABNORMAL LOW (ref 1.7–2.4)

## 2020-04-21 LAB — PHOSPHORUS: Phosphorus: 2.5 mg/dL (ref 2.5–4.6)

## 2020-04-21 LAB — GLUCOSE, CAPILLARY: Glucose-Capillary: 81 mg/dL (ref 70–99)

## 2020-04-21 MED ORDER — LORAZEPAM 2 MG/ML IJ SOLN
0.5000 mg | Freq: Every evening | INTRAMUSCULAR | Status: DC | PRN
  Administered 2020-04-28: 0.5 mg via INTRAVENOUS
  Filled 2020-04-21 (×2): qty 1

## 2020-04-21 MED ORDER — MORPHINE SULFATE (PF) 2 MG/ML IV SOLN
2.0000 mg | INTRAVENOUS | Status: DC | PRN
Start: 2020-04-21 — End: 2020-04-22
  Administered 2020-04-21: 2 mg via INTRAVENOUS
  Filled 2020-04-21: qty 1

## 2020-04-21 MED ORDER — ALPRAZOLAM 0.25 MG PO TABS: 0.2500 mg | ORAL_TABLET | Freq: Every evening | ORAL | Status: DC | PRN

## 2020-04-21 NOTE — Plan of Care (Signed)
  Problem: Activity: Goal: Ability to tolerate increased activity will improve Outcome: Progressing   Problem: Clinical Measurements: Goal: Ability to maintain a body temperature in the normal range will improve Outcome: Progressing   Problem: Respiratory: Goal: Ability to maintain adequate ventilation will improve Outcome: Progressing Goal: Ability to maintain a clear airway will improve Outcome: Progressing   Problem: Education: Goal: Knowledge of General Education information will improve Description: Including pain rating scale, medication(s)/side effects and non-pharmacologic comfort measures Outcome: Progressing   Problem: Health Behavior/Discharge Planning: Goal: Ability to manage health-related needs will improve Outcome: Progressing   Problem: Clinical Measurements: Goal: Ability to maintain clinical measurements within normal limits will improve Outcome: Progressing Goal: Will remain free from infection Outcome: Progressing Goal: Diagnostic test results will improve Outcome: Progressing Goal: Respiratory complications will improve Outcome: Progressing Goal: Cardiovascular complication will be avoided Outcome: Progressing   Problem: Activity: Goal: Risk for activity intolerance will decrease Outcome: Progressing   Problem: Nutrition: Goal: Adequate nutrition will be maintained Outcome: Progressing   Problem: Coping: Goal: Level of anxiety will decrease Outcome: Progressing   Problem: Elimination: Goal: Will not experience complications related to bowel motility Outcome: Progressing Goal: Will not experience complications related to urinary retention Outcome: Progressing   Problem: Pain Managment: Goal: General experience of comfort will improve Outcome: Progressing   Problem: Safety: Goal: Ability to remain free from injury will improve Outcome: Progressing   Problem: Skin Integrity: Goal: Risk for impaired skin integrity will decrease Outcome:  Progressing   Problem: Nutrition Goal: Patient maintains adequate hydration Outcome: Progressing Goal: Patient maintains weight Outcome: Progressing Goal: Patient/Family demonstrates understanding of diet Outcome: Progressing Goal: Patient/Family independently completes tube feeding Outcome: Progressing Goal: Patient will have no more than 5 lb weight change during LOS Outcome: Progressing Goal: Patient will utilize adaptive techniques to administer nutrition Outcome: Progressing Goal: Patient will verbalize dietary restrictions Outcome: Progressing

## 2020-04-21 NOTE — Progress Notes (Signed)
Patient and patient's family wants advance directives filled out with chaplain before scheduled surgery tomorrow 04/22/2020. Blue advance directives packet is placed in patient's chart, MD notified of patients desire to get advance directives complete by tomorrow am. Will notify oncoming shift as well.

## 2020-04-21 NOTE — Discharge Instructions (Signed)
                  Intensive Outpatient Programs  [facility]    [facility] [address]     [address] [place],  [place]     [place], [place] [phone]      [phone]  [facility]   [facility]  (Inpatient and outpatient)  [phone] (Suboxone and Methadone) [address]           [phone]           [facility]    [facility] - Intensive Outpatient [address]     [address] [place], [place] [address]     [place], [place]  [phone]      [phone]  [facility] (Outpatient, Inpatient, [facility]  [facility] (Groups and Residental) (insurance only) [phone]    [place], [place]          [phone]       [facility]    [facility] (for caregivers and family) [address]     [address] [place], [place]     [place], [place] [phone]      [phone]  Residential Treatment Programs  [facility]  [facility](2 years) Residential: 90 days)  [facility] [address]      [address] [place], [place]     [place], [place] [phone]      [phone] or [phone]  [facility]    [facility] [address]      [address] [place], [place]     [place], [place] [phone]      [phone]  [facility]   [facility] ([facility]) [address]     [address] [place], [place] [address]     [place], [place] [phone]      [phone] Admissions: 8am-3pm M-F  [facility]: Residential Program (90 Days)              [facility]: [facility]  [place], [place]     [place],   [phone] or [phone]    (Walk in Hours over the weekend or by referral)   [place]: [facility]:[phone] (for crisis  response 24 hours a day) 

## 2020-04-21 NOTE — TOC Initial Note (Signed)
Transition of Care [facility]) - Initial/Assessment Note    Patient Details  Name: Mario Peters MRN: [id number] Date of Birth: 1965/05/20  [facility] CM/SW Contact:    Oretha Milch, LCSW Phone Number: 04/21/2020, 12:16 PM  Clinical Narrative:  CSW acknowledges consult for ETOH screen and outpatient supports. Patient continues to present with altered mental status. CSW attempted to meet with patient and noted patient was receiving patient care and remained presenting in an altered manner. At this time patient is not appropriate for SA screen or conversation. Please reach out to [facility] team with patient is more appropriate to address alcohol use. Otherwise, CSW added SA resources to AVS.    Expected Discharge Plan: Home/Self Care Barriers to Discharge: Continued Medical Work up   Patient Goals and CMS Choice Patient states their goals for this hospitalization and ongoing recovery are:: Altered mental status barrier to assessing at this time      Expected Discharge Plan and Services Expected Discharge Plan: Home/Self Care                                              Prior Living Arrangements/Services     Patient language and need for interpreter reviewed:: Yes        Need for Family Participation in Patient Care: Yes (Comment) Care giver support system in place?: No (comment)   Criminal Activity/Legal Involvement Pertinent to Current Situation/Hospitalization: No - Comment as needed  Activities of Daily Living Home Assistive Devices/Equipment: None ADL Screening (condition at time of admission) Patient's cognitive ability adequate to safely complete daily activities?: Yes Is the patient deaf or have difficulty hearing?: No Does the patient have difficulty seeing, even when wearing glasses/contacts?: No Does the patient have difficulty concentrating, remembering, or making decisions?: No Patient able to express need for assistance with ADLs?: Yes Does  the patient have difficulty dressing or bathing?: No Independently performs ADLs?: Yes (appropriate for developmental age) Does the patient have difficulty walking or climbing stairs?: No Weakness of Legs: None Weakness of Arms/Hands: None  Permission Sought/Granted                  Emotional Assessment       Orientation: : Fluctuating Orientation (Suspected and/or reported Sundowners) Alcohol / Substance Use: Alcohol Use Psych Involvement: No (comment)  Admission diagnosis:  Hypokalemia [E87.6] Alcohol abuse [H29.92] Alcoholic cirrhosis ([place]) [E26.83] AKI (acute kidney injury) ([place]) [N17.9] Sepsis ([place]) [A41.9] Sepsis, due to unspecified organism, unspecified whether acute organ dysfunction present [facility]) [A41.9] Patient Active Problem List   Diagnosis Date Noted  . Protein-calorie malnutrition, severe 04/20/2020  . Hypophosphatemia 04/19/2020  . Hypomagnesemia 04/19/2020  . AKI (acute kidney injury) ([place]) 04/18/2020  . Dehydration 04/18/2020  . CAP (community acquired pneumonia) 04/18/2020  . 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 Hyponatremia 04/18/2020  . Weight loss 04/18/2020  . Hypokalemia 04/18/2020  . Malnutrition ([place]) 04/18/2020  . Tobacco abuse 04/18/2020  . Sepsis ([place]) 04/17/2020   PCP:  Patient, [name] Pharmacy:   [facility] [place], [place] [address] Phone: [phone] Fax: [phone]     Social Determinants of Health (SDOH) Interventions    Readmission Risk Interventions No flowsheet data found.

## 2020-04-21 NOTE — Progress Notes (Signed)
PROGRESS NOTE    Mario Peters  DGU:[id number] DOB: 01-17-1966 DOA: 04/17/2020 PCP: Patient, [name]  Brief Narrative: 55 year old male with history of alcohol and tobacco abuse, very poor historian presented to the ED with multiple complaints including weakness, ongoing dysphagia with very poor p.o. intake for 4 months, ongoing weight loss, hoarseness, last 1 to 2 weeks please had worsening cough, last couple of days has been so weak that he was unable to get out of bed.  In the ER he was noted to be hypertensive with blood pressures in the 70s which improved with fluid resuscitation, also noted to be hypoxic requiring supplemental O2. 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 recommended, continue ice chips, sips of water only until tracheostomy, will need PEG tube thereafter Main family contact is sisterStanton Kidney Peters: [phone]  Tobacco  abuse -Counseled  Alcohol abuse Alcoholic liver disease -Mildly elevated bilirubin, low albumin -Ultrasound does not indicate cirrhosis yet however does have spider nevi on exam  Severe hypokalemia, hypophosphatemia and hypomagnesemia Refeeding syndrome-likely -Aggressively repleted potassium, phosphorus and mag -Mag and phos levels pending for today  Severe protein calorie malnutrition -Albumin is 1.7, could be secondary to ongoing weight loss from dysphagia, possible malignancy, alcoholic liver disease also contributing  DVT prophylaxis:lovenox Code Status: Full Code Family Communication: none at bedside, called and updated patient's sister Mario Peters Disposition Plan:  Status is: Inpatient  Remains inpatient appropriate because:Inpatient level of care appropriate due to severity of illness   Dispo: The patient is from: Home              Anticipated d/c is to: Home              Patient currently is not medically stable to d/c.   Difficult to place patient No  Consultants:   ENT   Procedures:   Antimicrobials:    Subjective: -Asking for ice cream, continues to have cough after drinking  Objective: Vitals:   04/21/20 0009 04/21/20 0010 04/21/20 0408 04/21/20 0750  BP:  (!) 101/45  110/77  Pulse:  67  70  Resp:  (!) 21  16  Temp: (!) 97.5 F (36.4 C)  99.1 F (37.3 C) 98.7 F (37.1 C)  TempSrc: Axillary  Oral Oral  SpO2:  96%  97%  Weight:      Height:        Intake/Output Summary (Last 24 hours) at 04/21/2020 1029 Last data filed at 04/21/2020 0625 Gross per 24 hour  Intake 1629.75 ml  Output 1400 ml  Net 229.75 ml   [employer]  04/17/20 2132 04/19/20 0736  Weight: 65.8 kg 51.6 kg    Examination:  General exam: Chronically ill disheveled male sitting up in bed, awake alert oriented to self and place, hoarse dysarthric speech HEENT: Mild stridor, poor dentition, spit spider nevi on upper chest CVS: S1-S2, regular rate rhythm Lungs: Poor air  movement bilaterally, scattered basilar rhonchi Abdomen: Soft, nontender, bowel sounds present Extremities: No edema  Neuro: Moves all extremities, no localizing signs Psychiatry: Very poor insight and judgment.     Data Reviewed:   CBC: Recent Labs  Lab 04/17/20 1936 04/18/20 0010 04/19/20 0139 04/20/20 0110 04/21/20 0025  WBC 17.5*  --  14.8* 10.7* 12.2*  NEUTROABS 15.8*  --   --   --   --   HGB 16.5 14.6 12.0* 12.1* 12.7*  HCT 46.5 43.0 33.9* 35.2* 37.9*  MCV 86.3  --  87.8 90.0 91.1  PLT 244  --  200 186 354   Basic Metabolic Panel: Recent Labs  Lab 04/17/20 1936 04/18/20 0010 04/18/20 0700 04/19/20 0139 04/19/20 1536 04/20/20 0110 04/21/20 0025 04/21/20 0806  NA 128*   < > 129* 131* 130* 129* 132* 132*  K 2.1*   < > 2.9* 2.3* 3.2* 3.4* 3.4* 3.5  CL 82*  --  86* 89* 89* 89* 89* 86*  CO2 31  --  34* 35* 36* 32 37* 35*  GLUCOSE 105*  --  98 88 130* 91 76 66*  BUN 79*  --  63* 25* 12 7 <5* <5*  CREATININE 1.99*  --  1.11 0.55* 0.48* 0.40* 0.47* 0.57*  CALCIUM 7.5*  --  8.1* 8.0* 7.7* 7.2* 7.8* 8.0*  MG 2.1  --   --  1.5* 1.5* 1.3* 1.4*  --   PHOS  --   --  2.1* <1.0*  --  2.9 2.5  --    < > = values in this interval not displayed.   GFR: Estimated Creatinine Clearance: 77 mL/min (A) (by C-G formula based on SCr of 0.57 mg/dL (L)). Liver Function Tests: Recent Labs  Lab 04/17/20 1936 04/18/20 0700 04/19/20 0139 04/20/20 0110 04/21/20 0025  AST 33 31 37 35 25  ALT 19 20 19 22 19   ALKPHOS 73 63 76 56 56  BILITOT 1.8* 1.3* 1.0 0.5 0.8  PROT 6.1* 5.2* 4.9* 4.9* 5.2*  ALBUMIN 2.1* 1.7* 1.6* 1.7* 1.8*   No results for input(s): LIPASE, AMYLASE in the last 168 hours. Recent Labs  Lab 04/17/20 2357  AMMONIA 25   Coagulation Profile: Recent Labs  Lab 04/17/20 1936  INR 1.4*   Cardiac Enzymes: Recent Labs  Lab 04/18/20 0700  CKTOTAL 117   BNP (last 3 results) No results for input(s): PROBNP in the last 8760 hours. HbA1C: No results for  input(s): HGBA1C in the last 72 hours. CBG: Recent Labs  Lab 04/21/20 0344  GLUCAP 81   Lipid Profile: No results for input(s): CHOL, HDL, LDLCALC, TRIG, CHOLHDL, LDLDIRECT in the last 72 hours. Thyroid Function Tests: No results for input(s): TSH, T4TOTAL, FREET4, T3FREE, THYROIDAB in the last 72 hours. Anemia Panel: No results for input(s): VITAMINB12, FOLATE, FERRITIN, TIBC, IRON, RETICCTPCT in the last 72 hours. Urine analysis:    Component Value Date/Time   COLORURINE AMBER (A) 04/17/2020 2133   APPEARANCEUR CLOUDY (A) 04/17/2020 2133   LABSPEC 1.014 04/17/2020 2133   PHURINE 5.0 04/17/2020 2133   GLUCOSEU NEGATIVE 04/17/2020 2133   HGBUR SMALL (A) 04/17/2020 2133   BILIRUBINUR NEGATIVE 04/17/2020 2133  [place] NEGATIVE 04/17/2020 2133   PROTEINUR NEGATIVE 04/17/2020 2133   NITRITE NEGATIVE 04/17/2020 2133   LEUKOCYTESUR NEGATIVE 04/17/2020 2133   Sepsis Labs: @LABRCNTIP (procalcitonin:4,lacticidven:4)  ) Recent Results (from the past 240 hour(s))  Blood Culture (routine x 2)     Status: None (Preliminary result)   Collection Time: 04/17/20  7:35 PM   Specimen: BLOOD  Result Value Ref Range Status   Specimen Description BLOOD RIGHT ANTECUBITAL  Final   Special Requests   Final    BOTTLES DRAWN AEROBIC AND ANAEROBIC Blood Culture adequate volume   Culture   Final    NO GROWTH 3 DAYS Performed at [facility], [place] [address], [place], [place] [address]    Report Status PENDING  Incomplete  Resp Panel by RT-PCR (Flu A&B, Covid) Nasopharyngeal Swab     Status: None   Collection Time: 04/17/20  9:33 PM   Specimen: Nasopharyngeal Swab; Nasopharyngeal(NP) swabs in vial transport medium  Result Value Ref Range Status   SARS Coronavirus 2 by RT PCR NEGATIVE NEGATIVE Final    Comment: (NOTE) SARS-CoV-2 target nucleic acids are NOT DETECTED.  The SARS-CoV-2 RNA is generally detectable in upper respiratory specimens during the acute phase of infection. The  lowest concentration of SARS-CoV-2 viral copies this assay can detect is 138 copies/mL. A negative result does not preclude SARS-Cov-2 infection and should not be used as the sole basis for treatment or other patient management decisions. A negative result may occur with  improper specimen collection/handling, submission of specimen other than nasopharyngeal swab, presence of viral mutation(s) within the areas targeted by this assay, and inadequate number of viral copies(<138 copies/mL). A negative result must be combined with clinical observations, patient history, and epidemiological information. The expected result is Negative.  Fact Sheet for Patients:  EntrepreneurPulse.com.au  Fact Sheet for Healthcare Providers:  IncredibleEmployment.be  This test is no t yet approved or cleared by the [place] FDA and  has been authorized for detection and/or diagnosis of SARS-CoV-2 by FDA under an Emergency Use Authorization (EUA). This EUA will remain  in effect (meaning this test can be used) for the duration of the COVID-19 declaration under Section 564(b)(1) of the Act, 21 U.S.C.section 360bbb-3(b)(1), unless the authorization is terminated  or revoked sooner.       Influenza A by PCR NEGATIVE NEGATIVE Final   Influenza B by PCR NEGATIVE NEGATIVE Final    Comment: (NOTE) The Xpert Xpress SARS-CoV-2/FLU/RSV plus assay is intended as an aid in the diagnosis of influenza from Nasopharyngeal swab specimens and should not be used as a sole basis for treatment. Nasal washings and aspirates are unacceptable for Xpert Xpress SARS-CoV-2/FLU/RSV testing.  Fact Sheet for Patients: EntrepreneurPulse.com.au  Fact Sheet for Healthcare Providers: IncredibleEmployment.be  This test is not yet approved or cleared by the [place] FDA and has been authorized for detection and/or diagnosis of SARS-CoV-2 by FDA under  an Emergency Use Authorization (EUA). This EUA will remain in effect (meaning this test can be used) for the duration of the COVID-19 declaration under Section 564(b)(1) of the Act, 21 U.S.C. section 360bbb-3(b)(1), unless the authorization is terminated or revoked.  Performed at [facility], [place] [address] [place], [place] [address]   Blood Culture (routine x 2)     Status: None (Preliminary result)   Collection Time: 04/17/20 10:07 PM   Specimen: BLOOD RIGHT FOREARM  Result Value Ref Range Status   Specimen Description BLOOD RIGHT FOREARM  Final  Special Requests   Final    BOTTLES DRAWN AEROBIC AND ANAEROBIC Blood Culture adequate volume   Culture   Final    NO GROWTH 3 DAYS Performed at [facility], [place] [address], [place], [place] [address]    Report Status PENDING  Incomplete     Scheduled Meds: . enoxaparin (LOVENOX) injection  40 mg Subcutaneous Daily  . feeding supplement  237 mL Oral BID BM  . nicotine  21 mg Transdermal Daily   Continuous Infusions: . sodium chloride Stopped (04/21/20 0623)  . azithromycin Stopped (04/20/20 2339)  . ceFEPime (MAXIPIME) IV 200 mL/hr at 04/21/20 0625     LOS: 4 days    Time spent: 9min  Domenic Polite, MD [facility]  04/21/2020, 10:29 AM

## 2020-04-21 NOTE — Progress Notes (Signed)
TR[facility] night shift.  The nursing staff reported that the patient is having pleuritic chest pain in the setting of aspiration pneumonia and large laryngeal mass.  Morphine 2 mg IVP every 3 hours as needed x3 doses ordered.  Tennis Must, MD.

## 2020-04-22 ENCOUNTER — Encounter (HOSPITAL_COMMUNITY)
Admission: EM | Disposition: A | Discharge status: Home Health | Payer: Self-pay | Source: Home / Self Care | Attending: Internal Medicine

## 2020-04-22 ENCOUNTER — Inpatient Hospital Stay (HOSPITAL_COMMUNITY): Payer: Medicaid Other

## 2020-04-22 ENCOUNTER — Inpatient Hospital Stay (HOSPITAL_COMMUNITY): Payer: Medicaid Other | Admitting: Certified Registered Nurse Anesthetist

## 2020-04-22 ENCOUNTER — Encounter (HOSPITAL_COMMUNITY): Payer: Self-pay | Admitting: Internal Medicine

## 2020-04-22 DIAGNOSIS — K7030 Alcoholic cirrhosis of liver without ascites: Secondary | ICD-10-CM

## 2020-04-22 DIAGNOSIS — A419 Sepsis, unspecified organism: Secondary | ICD-10-CM | POA: Diagnosis not present

## 2020-04-22 HISTORY — PX: TRACHEOSTOMY TUBE PLACEMENT: SHX814

## 2020-04-22 HISTORY — PX: DIRECT LARYNGOSCOPY: SHX5326

## 2020-04-22 HISTORY — PX: ESOPHAGOSCOPY: SHX5534

## 2020-04-22 LAB — COMPREHENSIVE METABOLIC PANEL
ALT: 17 U/L (ref 0–44)
AST: 20 U/L (ref 15–41)
Albumin: 1.8 g/dL — ABNORMAL LOW (ref 3.5–5.0)
Alkaline Phosphatase: 60 U/L (ref 38–126)
Anion gap: 10 (ref 5–15)
BUN: 6 mg/dL (ref 6–20)
CO2: 37 mmol/L — ABNORMAL HIGH (ref 22–32)
Calcium: 8.1 mg/dL — ABNORMAL LOW (ref 8.9–10.3)
Chloride: 87 mmol/L — ABNORMAL LOW (ref 98–111)
Creatinine, Ser: 0.53 mg/dL — ABNORMAL LOW (ref 0.61–1.24)
GFR, Estimated: >60 mL/min (ref >60)
Glucose, Bld: 78 mg/dL (ref 70–99)
Potassium: 3.4 mmol/L — ABNORMAL LOW (ref 3.5–5.1)
Sodium: 134 mmol/L — ABNORMAL LOW (ref 135–145)
Total Bilirubin: 1.1 mg/dL (ref 0.3–1.2)
Total Protein: 5.4 g/dL — ABNORMAL LOW (ref 6.5–8.1)

## 2020-04-22 LAB — CBC WITH DIFFERENTIAL/PLATELET
Abs Immature Granulocytes: 0.15 10*3/uL — ABNORMAL HIGH (ref 0.00–0.07)
Basophils Absolute: 0 10*3/uL (ref 0.0–0.1)
Basophils Relative: 0 %
Eosinophils Absolute: 0 10*3/uL (ref 0.0–0.5)
Eosinophils Relative: 0 %
HCT: 37.9 % — ABNORMAL LOW (ref 39.0–52.0)
Hemoglobin: 12.5 g/dL — ABNORMAL LOW (ref 13.0–17.0)
Immature Granulocytes: 1 %
Lymphocytes Relative: 3 %
Lymphs Abs: 0.4 10*3/uL — ABNORMAL LOW (ref 0.7–4.0)
MCH: 30.1 pg (ref 26.0–34.0)
MCHC: 33 g/dL (ref 30.0–36.0)
MCV: 91.3 fL (ref 80.0–100.0)
Monocytes Absolute: 0.3 10*3/uL (ref 0.1–1.0)
Monocytes Relative: 2 %
Neutro Abs: 12.3 10*3/uL — ABNORMAL HIGH (ref 1.7–7.7)
Neutrophils Relative %: 94 %
Platelets: 219 10*3/uL (ref 150–400)
RBC: 4.15 MIL/uL — ABNORMAL LOW (ref 4.22–5.81)
RDW: 11.7 % (ref 11.5–15.5)
WBC: 13.2 10*3/uL — ABNORMAL HIGH (ref 4.0–10.5)
nRBC: 0 % (ref 0.0–0.2)

## 2020-04-22 LAB — POCT I-STAT, CHEM 8
BUN: 6 mg/dL (ref 6–20)
Calcium, Ion: 1.06 mmol/L — ABNORMAL LOW (ref 1.15–1.40)
Chloride: 84 mmol/L — ABNORMAL LOW (ref 98–111)
Creatinine, Ser: 0.4 mg/dL — ABNORMAL LOW (ref 0.61–1.24)
Glucose, Bld: 72 mg/dL (ref 70–99)
HCT: 41 % (ref 39.0–52.0)
Hemoglobin: 13.9 g/dL (ref 13.0–17.0)
Potassium: 3.5 mmol/L (ref 3.5–5.1)
Sodium: 132 mmol/L — ABNORMAL LOW (ref 135–145)
TCO2: 36 mmol/L — ABNORMAL HIGH (ref 22–32)

## 2020-04-22 LAB — CBC
HCT: 38.9 % — ABNORMAL LOW (ref 39.0–52.0)
Hemoglobin: 12.8 g/dL — ABNORMAL LOW (ref 13.0–17.0)
MCH: 30 pg (ref 26.0–34.0)
MCHC: 32.9 g/dL (ref 30.0–36.0)
MCV: 91.3 fL (ref 80.0–100.0)
Platelets: 217 10*3/uL (ref 150–400)
RBC: 4.26 MIL/uL (ref 4.22–5.81)
RDW: 11.6 % (ref 11.5–15.5)
WBC: 13.4 10*3/uL — ABNORMAL HIGH (ref 4.0–10.5)
nRBC: 0 % (ref 0.0–0.2)

## 2020-04-22 LAB — BASIC METABOLIC PANEL
Anion gap: 10 (ref 5–15)
BUN: 6 mg/dL (ref 6–20)
CO2: 37 mmol/L — ABNORMAL HIGH (ref 22–32)
Calcium: 8 mg/dL — ABNORMAL LOW (ref 8.9–10.3)
Chloride: 86 mmol/L — ABNORMAL LOW (ref 98–111)
Creatinine, Ser: 0.5 mg/dL — ABNORMAL LOW (ref 0.61–1.24)
GFR, Estimated: >60 mL/min (ref >60)
Glucose, Bld: 68 mg/dL — ABNORMAL LOW (ref 70–99)
Potassium: 2.9 mmol/L — ABNORMAL LOW (ref 3.5–5.1)
Sodium: 133 mmol/L — ABNORMAL LOW (ref 135–145)

## 2020-04-22 LAB — PHOSPHORUS
Phosphorus: 2.9 mg/dL (ref 2.5–4.6)
Phosphorus: 2.2 mg/dL — ABNORMAL LOW (ref 2.5–4.6)

## 2020-04-22 LAB — HIV ANTIBODY (ROUTINE TESTING W REFLEX): HIV Screen 4th Generation wRfx: NONREACTIVE

## 2020-04-22 LAB — MAGNESIUM
Magnesium: 1.2 mg/dL — ABNORMAL LOW (ref 1.7–2.4)
Magnesium: 1.1 mg/dL — ABNORMAL LOW (ref 1.7–2.4)

## 2020-04-22 LAB — TSH: TSH: 0.602 u[IU]/mL (ref 0.350–4.500)

## 2020-04-22 SURGERY — CREATION, TRACHEOSTOMY
Anesthesia: Monitor Anesthesia Care | Site: Throat

## 2020-04-22 MED ORDER — ONDANSETRON HCL 4 MG/2ML IJ SOLN
INTRAMUSCULAR | Status: DC | PRN
  Administered 2020-04-22: 4 mg via INTRAVENOUS

## 2020-04-22 MED ORDER — ONDANSETRON HCL 4 MG/2ML IJ SOLN
INTRAMUSCULAR | Status: AC
  Filled 2020-04-22: qty 2

## 2020-04-22 MED ORDER — POTASSIUM CHLORIDE 20 MEQ PO PACK
40.0000 meq | PACK | Freq: Once | ORAL | Status: AC
  Administered 2020-04-22: 40 meq
  Filled 2020-04-22: qty 2

## 2020-04-22 MED ORDER — ROCURONIUM BROMIDE 10 MG/ML (PF) SYRINGE
PREFILLED_SYRINGE | INTRAVENOUS | Status: AC
  Filled 2020-04-22: qty 30

## 2020-04-22 MED ORDER — SODIUM BICARBONATE 8.4 % IV SOLN
INTRAVENOUS | Status: AC
  Filled 2020-04-22: qty 50

## 2020-04-22 MED ORDER — MIDAZOLAM HCL 2 MG/2ML IJ SOLN
INTRAMUSCULAR | Status: AC
  Filled 2020-04-22: qty 2

## 2020-04-22 MED ORDER — MIDAZOLAM HCL 2 MG/2ML IJ SOLN
INTRAMUSCULAR | Status: DC | PRN
  Administered 2020-04-22: .5 mg via INTRAVENOUS

## 2020-04-22 MED ORDER — ROCURONIUM BROMIDE 10 MG/ML (PF) SYRINGE
PREFILLED_SYRINGE | INTRAVENOUS | Status: DC | PRN
  Administered 2020-04-22: 30 mg via INTRAVENOUS

## 2020-04-22 MED ORDER — ONDANSETRON HCL 4 MG/2ML IJ SOLN: 4.0000 mg | Freq: Once | INTRAMUSCULAR | Status: DC | PRN

## 2020-04-22 MED ORDER — LACTATED RINGERS IV SOLN: INTRAVENOUS | Status: DC

## 2020-04-22 MED ORDER — MORPHINE SULFATE (PF) 2 MG/ML IV SOLN
1.0000 mg | INTRAVENOUS | Status: AC | PRN
  Administered 2020-04-22 – 2020-04-24 (×2): 1 mg via INTRAVENOUS
  Filled 2020-04-22 (×3): qty 1

## 2020-04-22 MED ORDER — PHENYLEPHRINE 40 MCG/ML (10ML) SYRINGE FOR IV PUSH (FOR BLOOD PRESSURE SUPPORT)
PREFILLED_SYRINGE | INTRAVENOUS | Status: DC | PRN
  Administered 2020-04-22: 120 ug via INTRAVENOUS

## 2020-04-22 MED ORDER — LORAZEPAM 2 MG/ML IJ SOLN
0.5000 mg | Freq: Once | INTRAMUSCULAR | Status: AC
  Administered 2020-04-22: 0.5 mg via INTRAVENOUS

## 2020-04-22 MED ORDER — PHENYLEPHRINE HCL-NACL 10-0.9 MG/250ML-% IV SOLN
INTRAVENOUS | Status: DC | PRN
  Administered 2020-04-22: 25 ug/min via INTRAVENOUS

## 2020-04-22 MED ORDER — HYDROCODONE-ACETAMINOPHEN 5-325 MG PO TABS: 1.0000 | ORAL_TABLET | ORAL | Status: DC | PRN

## 2020-04-22 MED ORDER — HYDROCODONE-ACETAMINOPHEN 5-325 MG PO TABS
1.0000 | ORAL_TABLET | ORAL | Status: DC | PRN
  Administered 2020-04-22: 1
  Administered 2020-04-24 – 2020-05-02 (×18): 2
  Filled 2020-04-22 (×9): qty 2
  Filled 2020-04-22: qty 1
  Filled 2020-04-22 (×10): qty 2

## 2020-04-22 MED ORDER — FENTANYL CITRATE (PF) 250 MCG/5ML IJ SOLN
INTRAMUSCULAR | Status: DC | PRN
  Administered 2020-04-22: 50 ug via INTRAVENOUS
  Administered 2020-04-22: 75 ug via INTRAVENOUS
  Administered 2020-04-22: 25 ug via INTRAVENOUS

## 2020-04-22 MED ORDER — LACTATED RINGERS IV SOLN: INTRAVENOUS | Status: DC | PRN

## 2020-04-22 MED ORDER — LIDOCAINE-EPINEPHRINE 1 %-1:100000 IJ SOLN
INTRAMUSCULAR | Status: DC | PRN
  Administered 2020-04-22: 10 mL

## 2020-04-22 MED ORDER — ACETAMINOPHEN 325 MG PO TABS
650.0000 mg | ORAL_TABLET | Freq: Four times a day (QID) | ORAL | Status: DC | PRN
  Administered 2020-04-25: 650 mg via ORAL
  Filled 2020-04-22: qty 2

## 2020-04-22 MED ORDER — LIDOCAINE-EPINEPHRINE 1 %-1:100000 IJ SOLN
INTRAMUSCULAR | Status: AC
  Filled 2020-04-22: qty 1

## 2020-04-22 MED ORDER — EPINEPHRINE HCL (NASAL) 0.1 % NA SOLN
NASAL | Status: AC
  Filled 2020-04-22: qty 30

## 2020-04-22 MED ORDER — LIDOCAINE 2% (20 MG/ML) 5 ML SYRINGE
INTRAMUSCULAR | Status: AC
  Filled 2020-04-22: qty 5

## 2020-04-22 MED ORDER — SUGAMMADEX SODIUM 200 MG/2ML IV SOLN
INTRAVENOUS | Status: DC | PRN
  Administered 2020-04-22: 200 mg via INTRAVENOUS

## 2020-04-22 MED ORDER — FENTANYL CITRATE (PF) 250 MCG/5ML IJ SOLN
INTRAMUSCULAR | Status: AC
  Filled 2020-04-22: qty 5

## 2020-04-22 MED ORDER — CHLORHEXIDINE GLUCONATE CLOTH 2 % EX PADS
6.0000 | MEDICATED_PAD | Freq: Every day | CUTANEOUS | Status: DC
  Administered 2020-04-22 – 2020-05-01 (×11): 6 via TOPICAL

## 2020-04-22 MED ORDER — 0.9 % SODIUM CHLORIDE (POUR BTL) OPTIME
TOPICAL | Status: DC | PRN
  Administered 2020-04-22: 1000 mL

## 2020-04-22 MED ORDER — DEXAMETHASONE SODIUM PHOSPHATE 10 MG/ML IJ SOLN: INTRAMUSCULAR | Status: DC | PRN

## 2020-04-22 MED ORDER — POTASSIUM CHLORIDE 10 MEQ/100ML IV SOLN
10.0000 meq | INTRAVENOUS | Status: AC
  Administered 2020-04-22 (×3): 10 meq via INTRAVENOUS
  Filled 2020-04-22 (×4): qty 100

## 2020-04-22 MED ORDER — DEXAMETHASONE SODIUM PHOSPHATE 10 MG/ML IJ SOLN
INTRAMUSCULAR | Status: AC
  Filled 2020-04-22: qty 2

## 2020-04-22 MED ORDER — MAGNESIUM SULFATE 2 GM/50ML IV SOLN
2.0000 g | Freq: Once | INTRAVENOUS | Status: AC
  Administered 2020-04-22: 2 g via INTRAVENOUS
  Filled 2020-04-22 (×2): qty 50

## 2020-04-22 MED ORDER — ORAL CARE MOUTH RINSE
15.0000 mL | Freq: Two times a day (BID) | OROMUCOSAL | Status: DC
  Administered 2020-04-23 – 2020-05-02 (×16): 15 mL via OROMUCOSAL

## 2020-04-22 MED ORDER — SODIUM CHLORIDE 0.9 % IV SOLN: INTRAVENOUS | Status: DC

## 2020-04-22 MED ORDER — OXYCODONE HCL 5 MG PO TABS
5.0000 mg | ORAL_TABLET | Freq: Once | ORAL | Status: DC | PRN
Start: 2020-04-22 — End: 2020-04-22

## 2020-04-22 MED ORDER — DEXAMETHASONE SODIUM PHOSPHATE 10 MG/ML IJ SOLN
INTRAMUSCULAR | Status: DC | PRN
  Administered 2020-04-22: 10 mg via INTRAVENOUS

## 2020-04-22 MED ORDER — PHENYLEPHRINE 40 MCG/ML (10ML) SYRINGE FOR IV PUSH (FOR BLOOD PRESSURE SUPPORT)
PREFILLED_SYRINGE | INTRAVENOUS | Status: AC
  Filled 2020-04-22: qty 10

## 2020-04-22 MED ORDER — OXYCODONE HCL 5 MG/5ML PO SOLN: 5.0000 mg | Freq: Once | ORAL | Status: DC | PRN

## 2020-04-22 MED ORDER — ACETAMINOPHEN 650 MG RE SUPP: 650.0000 mg | Freq: Four times a day (QID) | RECTAL | Status: DC | PRN

## 2020-04-22 MED ORDER — POTASSIUM CHLORIDE IN NACL 40-0.9 MEQ/L-% IV SOLN
INTRAVENOUS | Status: DC
  Filled 2020-04-22 (×6): qty 1000

## 2020-04-22 MED ORDER — CHLORHEXIDINE GLUCONATE 0.12 % MT SOLN
15.0000 mL | Freq: Two times a day (BID) | OROMUCOSAL | Status: DC
  Administered 2020-04-22 – 2020-05-02 (×20): 15 mL via OROMUCOSAL
  Filled 2020-04-22 (×13): qty 15

## 2020-04-22 MED ORDER — HYDROMORPHONE HCL 1 MG/ML IJ SOLN: 0.2500 mg | INTRAMUSCULAR | Status: DC | PRN

## 2020-04-22 MED ORDER — CHLORHEXIDINE GLUCONATE 0.12 % MT SOLN
OROMUCOSAL | Status: AC
  Filled 2020-04-22: qty 15

## 2020-04-22 SURGICAL SUPPLY — 42 items
BENZOIN TINCTURE PRP APPL 2/3 (GAUZE/BANDAGES/DRESSINGS) IMPLANT
BLADE CLIPPER SURG (BLADE) IMPLANT
CANISTER SUCT 3000ML PPV (MISCELLANEOUS) ×4 IMPLANT
CLEANER TIP ELECTROSURG 2X2 (MISCELLANEOUS) ×4 IMPLANT
COVER BACK TABLE 60X90IN (DRAPES) ×4 IMPLANT
COVER MAYO STAND STRL (DRAPES) ×4 IMPLANT
COVER SURGICAL LIGHT HANDLE (MISCELLANEOUS) ×4 IMPLANT
COVER WAND RF STERILE (DRAPES) ×4 IMPLANT
DECANTER SPIKE VIAL GLASS SM (MISCELLANEOUS) ×4 IMPLANT
DRAPE HALF SHEET 40X57 (DRAPES) ×4 IMPLANT
ELECT COATED BLADE 2.86 ST (ELECTRODE) ×4 IMPLANT
ELECT REM PT RETURN 9FT ADLT (ELECTROSURGICAL) ×4
ELECTRODE REM PT RTRN 9FT ADLT (ELECTROSURGICAL) ×3 IMPLANT
GAUZE 4X4 16PLY RFD (DISPOSABLE) ×4 IMPLANT
GLOVE ECLIPSE 7.5 STRL STRAW (GLOVE) ×4 IMPLANT
GOWN STRL REUS W/ TWL LRG LVL3 (GOWN DISPOSABLE) ×6 IMPLANT
GOWN STRL REUS W/TWL LRG LVL3 (GOWN DISPOSABLE) ×2
GUARD TEETH (MISCELLANEOUS) IMPLANT
KIT BASIN OR (CUSTOM PROCEDURE TRAY) ×4 IMPLANT
KIT TURNOVER KIT B (KITS) ×4 IMPLANT
NDL PRECISIONGLIDE 27X1.5 (NEEDLE) ×3 IMPLANT
NEEDLE PRECISIONGLIDE 27X1.5 (NEEDLE) ×4 IMPLANT
NS IRRIG 1000ML POUR BTL (IV SOLUTION) ×4 IMPLANT
PAD ARMBOARD 7.5X6 YLW CONV (MISCELLANEOUS) ×8 IMPLANT
PATTIES SURGICAL .5 X3 (DISPOSABLE) IMPLANT
PENCIL FOOT CONTROL (ELECTRODE) ×4 IMPLANT
SOL ANTI FOG 6CC (MISCELLANEOUS) IMPLANT
SOLUTION ANTI FOG 6CC (MISCELLANEOUS)
SUT CHROMIC 2 0 SH (SUTURE) ×4 IMPLANT
SUT ETHILON 3 0 PS 1 (SUTURE) ×4 IMPLANT
SUT SILK 4 0 (SUTURE) ×1
SUT SILK 4 0 TIE 10X30 (SUTURE) ×4 IMPLANT
SUT SILK 4-0 18XBRD TIE 12 (SUTURE) ×3 IMPLANT
SYR 20ML LL LF (SYRINGE) ×4 IMPLANT
SYR CONTROL 10ML LL (SYRINGE) IMPLANT
TOWEL GREEN STERILE (TOWEL DISPOSABLE) ×4 IMPLANT
TOWEL GREEN STERILE FF (TOWEL DISPOSABLE) ×4 IMPLANT
TRAY ENT MC OR (CUSTOM PROCEDURE TRAY) ×4 IMPLANT
TUBE CONNECTING 12X1/4 (SUCTIONS) ×4 IMPLANT
TUBE TRACH  6.0 CUFF FLEX (MISCELLANEOUS) ×1
TUBE TRACH 6.0 CUFF FLEX (MISCELLANEOUS) IMPLANT
WATER STERILE IRR 1000ML POUR (IV SOLUTION) ×4 IMPLANT

## 2020-04-22 NOTE — Transfer of Care (Signed)
Immediate Anesthesia Transfer of Care Note  Patient: Mario Peters  Procedure(s) Performed: TRACHEOSTOMY (N/A ) DIRECT LARYNGOSCOPY WITH BIOPSY (N/A Mouth) ESOPHAGOSCOPY (Throat)  Patient Location: PACU  Anesthesia Type:General  Level of Consciousness: drowsy  Airway & Oxygen Therapy: Patient Spontanous Breathing and Patient connected to face mask oxygen  Post-op Assessment: Report given to RN and Post -op Vital signs reviewed and stable  Post vital signs: Reviewed and stable  Last Vitals:  Vitals Value Taken Time  BP 96/61 04/22/20 1321  Temp    Pulse 86 04/22/20 1323  Resp 18 04/22/20 1323  SpO2 94 % 04/22/20 1323  Vitals shown include unvalidated device data.  Last Pain:  Vitals:   04/22/20 1151  TempSrc:   PainSc: 4       Patients Stated Pain Goal: 2 (15/05/69 7948)  Complications: No complications documented.

## 2020-04-22 NOTE — Discharge Summary (Signed)
PROGRESS NOTE    Stefen Juba  ACZ:[id number] DOB: Aug 17, 1965 DOA: 04/17/2020 PCP: Patient, [name]  Brief Narrative: 55 year old male with history of alcohol and tobacco abuse, very poor historian presented to the ED with multiple complaints including weakness, ongoing dysphagia with very poor p.o. intake for 4 months, ongoing weight loss, hoarseness, last 1 to 2 weeks please had worsening cough, last couple of days has been so weak that he was unable to get out of bed.  In the ER he was noted to be hypertensive with blood pressures in the 70s which improved with fluid resuscitation, also noted to be hypoxic requiring supplemental O2. 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 recommended, continue ice chips, sips of water only until tracheostomy, IR consulted for PEG tube -will d/w ENT regarding cortrak and tube feeds in the interim Main family contact is sisterStanton Kidney [place]: [phone]  Tobacco abuse -Counseled  Alcohol abuse Alcoholic liver  disease -Mildly elevated bilirubin, low albumin -Ultrasound does not indicate cirrhosis yet however does have spider nevi on exam  Severe hypokalemia, hypophosphatemia and hypomagnesemia Refeeding syndrome-likely -Aggressively repleted potassium, phosphorus and mag -IV mag again today around 1 cm  Severe protein calorie malnutrition -Albumin is 1.7, could be secondary to ongoing weight loss from dysphagia, possible malignancy, alcoholic liver disease also contributing  DVT prophylaxis:lovenox Code Status: Full Code Family Communication: Discussed with patient's sister and wife at bedside Disposition Plan:  Status is: Inpatient  Remains inpatient appropriate because:Inpatient level of care appropriate due to severity of illness   Dispo: The patient is from: Home              Anticipated d/c is to: Likely SNF              Patient currently is not medically stable to d/c.   Difficult to place patient No  Consultants:   ENT Dr. Constance Holster   Procedures:   Antimicrobials:    Subjective: -Asking for chocolate ice cream, continues to have cough and congestion  Objective: Vitals:   04/22/20 0400 04/22/20 0737 04/22/20 0800 04/22/20 0950  BP:  (!) 141/85 139/88 (!) 143/75  Pulse:  95 91 96  Resp:  14 19 19   Temp: 98.1 F (36.7 C) 98.7 F (37.1 C)    TempSrc: Oral Oral    SpO2:  95% 100% 98%  Weight:      Height:        Intake/Output Summary (Last 24 hours) at 04/22/2020 1034 Last data filed at 04/22/2020 0416 Gross per 24 hour  Intake 495.67 ml  Output 400 ml  Net 95.67 ml   [employer]   04/17/20 2132 04/19/20 0736  Weight: 65.8 kg  51.6 kg    Examination:  General exam: Chronically ill disheveled male cachectic sitting up in bed awake alert oriented x2, hoarse with dysarthric speech HEENT: Mild stridor, poor dentition, spider nevi on upper chest CVS: S1-S2, regular rate rhythm Lungs: Poor air movement bilaterally, scattered rhonchi at the bases Abdomen: Soft,  nontender, bowel sounds present Extremities: No edema Neuro: Moves all extremities, no localizing signs Psychiatry: Very poor insight and judgment.     Data Reviewed:   CBC: Recent Labs  Lab 04/17/20 1936 04/18/20 0010 04/19/20 0139 04/20/20 0110 04/21/20 0025 04/22/20 0409  WBC 17.5*  --  14.8* 10.7* 12.2* 13.4*  NEUTROABS 15.8*  --   --   --   --   --   HGB 16.5 14.6 12.0* 12.1* 12.7* 12.8*  HCT 46.5 43.0 33.9* 35.2* 37.9* 38.9*  MCV 86.3  --  87.8 90.0 91.1 91.3  PLT 244  --  200 186 181 546   Basic Metabolic Panel: Recent Labs  Lab 04/18/20 0700 04/19/20 0139 04/19/20 1536 04/20/20 0110 04/21/20 0025 04/21/20 0806 04/22/20 0409  NA 129* 131* 130* 129* 132* 132* 133*  K 2.9* 2.3* 3.2* 3.4* 3.4* 3.5 2.9*  CL 86* 89* 89* 89* 89* 86* 86*  CO2 34* 35* 36* 32 37* 35* 37*  GLUCOSE 98 88 130* 91 76 66* 68*  BUN 63* 25* 12 7 <5* <5* 6  CREATININE 1.11 0.55* 0.48* 0.40* 0.47* 0.57* 0.50*  CALCIUM 8.1* 8.0* 7.7* 7.2* 7.8* 8.0* 8.0*  MG  --  1.5* 1.5* 1.3* 1.4*  --  1.2*  PHOS 2.1* <1.0*  --  2.9 2.5  --  2.9   GFR: Estimated Creatinine Clearance: 77 mL/min (A) (by C-G formula based on SCr of 0.5 mg/dL (L)). Liver Function Tests: Recent Labs  Lab 04/17/20 1936 04/18/20 0700 04/19/20 0139 04/20/20 0110 04/21/20 0025  AST 33 31 37 35 25  ALT 19 20 19 22 19   ALKPHOS 73 63 76 56 56  BILITOT 1.8* 1.3* 1.0 0.5 0.8  PROT 6.1* 5.2* 4.9* 4.9* 5.2*  ALBUMIN 2.1* 1.7* 1.6* 1.7* 1.8*   No results for input(s): LIPASE, AMYLASE in the last 168 hours. Recent Labs  Lab 04/17/20 2357  AMMONIA 25   Coagulation Profile: Recent Labs  Lab 04/17/20 1936  INR 1.4*   Cardiac Enzymes: Recent Labs  Lab 04/18/20 0700  CKTOTAL 117   BNP (last 3 results) No results for input(s): PROBNP in the last 8760 hours. HbA1C: No results for input(s): HGBA1C in the last 72 hours. CBG: Recent Labs  Lab 04/21/20 0344  GLUCAP 81   Lipid Profile: No results for input(s): CHOL,  HDL, LDLCALC, TRIG, CHOLHDL, LDLDIRECT in the last 72 hours. Thyroid Function Tests: No results for input(s): TSH, T4TOTAL, FREET4, T3FREE, THYROIDAB in the last 72 hours. Anemia Panel: No results for input(s): VITAMINB12, FOLATE, FERRITIN, TIBC, IRON, RETICCTPCT in the last 72 hours. Urine analysis:    Component Value Date/Time   COLORURINE AMBER (A) 04/17/2020 2133   APPEARANCEUR CLOUDY (A) 04/17/2020 2133   LABSPEC 1.014 04/17/2020 2133   PHURINE 5.0 04/17/2020 2133   GLUCOSEU NEGATIVE 04/17/2020 2133   HGBUR SMALL (A) 04/17/2020 2133   BILIRUBINUR NEGATIVE 04/17/2020 2133   KETONESUR NEGATIVE 04/17/2020 2133   PROTEINUR NEGATIVE 04/17/2020 2133   NITRITE NEGATIVE 04/17/2020 2133   LEUKOCYTESUR NEGATIVE 04/17/2020 2133   Sepsis Labs: @LABRCNTIP (procalcitonin:4,lacticidven:4)  ) Recent Results (from the past 240 hour(s))  Blood Culture (routine x 2)  Status: None (Preliminary result)   Collection Time: 04/17/20  7:35 PM   Specimen: BLOOD  Result Value Ref Range Status   Specimen Description BLOOD RIGHT ANTECUBITAL  Final   Special Requests   Final    BOTTLES DRAWN AEROBIC AND ANAEROBIC Blood Culture adequate volume   Culture   Final    NO GROWTH 4 DAYS Performed at [facility], [address] [place], [place] [address]    Report Status PENDING  Incomplete  Resp Panel by RT-PCR (Flu A&B, Covid) Nasopharyngeal Swab     Status: None   Collection Time: 04/17/20  9:33 PM   Specimen: Nasopharyngeal Swab; Nasopharyngeal(NP) swabs in vial transport medium  Result Value Ref Range Status   SARS Coronavirus 2 by RT PCR NEGATIVE NEGATIVE Final    Comment: (NOTE) SARS-CoV-2 target nucleic acids are NOT DETECTED.  The SARS-CoV-2 RNA is generally detectable in upper respiratory specimens during the acute phase of infection. The lowest concentration of SARS-CoV-2 viral copies this assay can detect is 138 copies/mL. A negative result does not preclude SARS-Cov-2 infection  and should not be used as the sole basis for treatment or other patient management decisions. A negative result may occur with  improper specimen collection/handling, submission of specimen other than nasopharyngeal swab, presence of viral mutation(s) within the areas targeted by this assay, and inadequate number of viral copies(<138 copies/mL). A negative result must be combined with clinical observations, patient history, and epidemiological information. The expected result is Negative.  Fact Sheet for Patients:  EntrepreneurPulse.com.au  Fact Sheet for Healthcare Providers:  IncredibleEmployment.be  This test is no t yet approved or cleared by the [place] FDA and  has been authorized for detection and/or diagnosis of SARS-CoV-2 by FDA under an Emergency Use Authorization (EUA). This EUA will remain  in effect (meaning this test can be used) for the duration of the COVID-19 declaration under Section 564(b)(1) of the Act, 21 U.S.C.section 360bbb-3(b)(1), unless the authorization is terminated  or revoked sooner.       Influenza A by PCR NEGATIVE NEGATIVE Final   Influenza B by PCR NEGATIVE NEGATIVE Final    Comment: (NOTE) The Xpert Xpress SARS-CoV-2/FLU/RSV plus assay is intended as an aid in the diagnosis of influenza from Nasopharyngeal swab specimens and should not be used as a sole basis for treatment. Nasal washings and aspirates are unacceptable for Xpert Xpress SARS-CoV-2/FLU/RSV testing.  Fact Sheet for Patients: EntrepreneurPulse.com.au  Fact Sheet for Healthcare Providers: IncredibleEmployment.be  This test is not yet approved or cleared by the [place] FDA and has been authorized for detection and/or diagnosis of SARS-CoV-2 by FDA under an Emergency Use Authorization (EUA). This EUA will remain in effect (meaning this test can be used) for the duration of the COVID-19 declaration  under Section 564(b)(1) of the Act, 21 U.S.C. section 360bbb-3(b)(1), unless the authorization is terminated or revoked.  Performed at [facility], [place] [address] [place], [place] [address]   Blood Culture (routine x 2)     Status: None (Preliminary result)   Collection Time: 04/17/20 10:07 PM   Specimen: BLOOD RIGHT FOREARM  Result Value Ref Range Status   Specimen Description BLOOD RIGHT FOREARM  Final   Special Requests   Final    BOTTLES DRAWN AEROBIC AND ANAEROBIC Blood Culture adequate volume   Culture   Final    NO GROWTH 4 DAYS Performed at [facility], [place] [address] [place], [place] [address]  Report Status PENDING  Incomplete     Scheduled Meds: . enoxaparin (LOVENOX) injection  40 mg Subcutaneous Daily  . feeding supplement  237 mL Oral BID BM  . nicotine  21 mg Transdermal Daily   Continuous Infusions: . 0.9 % NaCl with KCl 40 mEq / L 50 mL/hr at 04/22/20 0809  . ceFEPime (MAXIPIME) IV 2 g (04/22/20 0505)  . potassium chloride 10 mEq (04/22/20 0954)     LOS: 5 days    Time spent: 16min  Domenic Polite, MD [facility]  04/22/2020, 10:34 AM

## 2020-04-22 NOTE — Progress Notes (Signed)
Rcvd notification from RX that 4th run KCL this morning not given. Omitting per Dr. Tamala Julian and tx w/ KCL 40 meq per tube.

## 2020-04-22 NOTE — Op Note (Signed)
04/22/2020 12:54 PM   PATIENT:  Mario Peters, 55 y.o. male  PRE-OPERATIVE DIAGNOSIS:  Laryngeal mass  POST-OPERATIVE DIAGNOSIS: Laryngeal mass  PROCEDURE:  Procedure(s): TRACHEOSTOMY, awake, direct laryngoscopy with biopsy, esophagoscopy with biopsy, under general anesthesia.  SURGEON:  Surgeon(s): Beckie Salts, MD  ASSISTANTS: none   ANESTHESIA:   general  EBL: Minimal   DRAINS: none   LOCAL MEDICATIONS USED:  NONE  COUNTS CORRECT:  YES  PROCEDURE DETAILS: Patient was taken to the operating room and placed on the operating table in the supine position. A shoulder roll was placed for positioning. The patient was provided with intravenous sedation. The neck was prepped and draped in a standard fashion. A vertical incision was created just above the sternal notch using electrocautery. The midline fascia was divided. The isthmus of the thyroid was reflected superiorly and the upper trachea was exposed. A tracheotomy was created between the second and third tracheal rings in a horizontal fashion. A lower tracheal flap was created with scissors and the flap was sutured to the cervical skin using 2-0 chromic suture. The orotracheal tube was removed. The # 6 flexible Shiley tracheostomy tube was placed without difficulty and the cuff was inflated. The shield was secured to the neck using a Velcro straps and nylon suture.  The patient was then placed under general anesthesia for the following procedures:  Rigid cervical esophagoscopy with biopsy.  The rigid cervical esophagoscope was passed into the oral cavity through the esophageal introitus and into the cervical esophagus.  Was then slowly withdrawn while suctioning secretions.  There were some abnormal appearing mucosa of the posterior wall just below the cricopharyngeus.  Biopsies were taken from this region.  The scope was removed.  Direct laryngoscopy with biopsy.  The Jako laryngoscope was used to evaluate the hypopharynx and the  larynx.  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  There were no recognizable structures.  It spilled over along the lateral pharyngeal wall on the right side.  The left piriform sinus appeared to be clear.  Biopsies were taken from the mass.  The subglottis could not be evaluated as the airway was not visualized.  Nasogastric feeding tube was then placed.  Suction was used to confirm positioning.  Patient was awakened transferred to PACU in stable condition.  PLAN OF CARE: Transfer to ICU  PATIENT DISPOSITION:  ICU - hemodynamically stable.

## 2020-04-22 NOTE — Interval H&P Note (Signed)
History and Physical Interval Note:  04/22/2020 11:21 AM  Mario Peters  has presented today for surgery, with the diagnosis of Laryngeal Swelling.  The various methods of treatment have been discussed with the patient and family. After consideration of risks, benefits and other options for treatment, the patient has consented to  Procedure(s): TRACHEOSTOMY (N/A) DIRECT LARYNGOSCOPY POSSIBLE BIOPSY (N/A) as a surgical intervention.  The patient's history has been reviewed, patient examined, no change in status, stable for surgery.  I have reviewed the patient's chart and labs.  Questions were answered to the patient's satisfaction.     [name]

## 2020-04-22 NOTE — Progress Notes (Signed)
Discussed case with hospitalist, patient appears stable. I am available PRN.  Follow usual post trach case, order placed.  Erskine Emery MD PCCM

## 2020-04-22 NOTE — Progress Notes (Signed)
Ok to stop azithromycin (6 days) and add stop date of 7 day to cefepime per Dr. Broadus John.  Onnie Boer, PharmD, BCIDP, AAHIVP, CPP Infectious Disease Pharmacist 04/22/2020 9:52 AM

## 2020-04-22 NOTE — Anesthesia Preprocedure Evaluation (Addendum)
Anesthesia Evaluation  Patient identified by MRN, date of birth, ID band Patient awake    Reviewed: Allergy & Precautions, NPO status   Airway Mallampati: III       Dental   Pulmonary pneumonia, Current SmokerPatient did not abstain from smoking.,    - rhonchi       Cardiovascular negative cardio ROS   Rhythm:Regular Rate:Normal     Neuro/Psych Seizures -,     GI/Hepatic negative GI ROS, Neg liver ROS,   Endo/Other    Renal/GU Renal disease     Musculoskeletal   Abdominal   Peds  Hematology   Anesthesia Other Findings   Reproductive/Obstetrics                            Anesthesia Physical Anesthesia Plan  ASA: IV  Anesthesia Plan: MAC   Post-op Pain Management:    Induction: Intravenous  PONV Risk Score and Plan: 2 and Ondansetron and Midazolam  Airway Management Planned: Simple Face Mask and Oral ETT  Additional Equipment:   Intra-op Plan:   Post-operative Plan: Possible Post-op intubation/ventilation  Informed Consent: I have reviewed the patients History and Physical, chart, labs and discussed the procedure including the risks, benefits and alternatives for the proposed anesthesia with the patient or authorized representative who has indicated his/her understanding and acceptance.    Discussed DNR with power of attorney and Suspend DNR.   Dental advisory given and Consent reviewed with POA  Plan Discussed with: Anesthesiologist and CRNA  Anesthesia Plan Comments:        Anesthesia Quick Evaluation

## 2020-04-23 ENCOUNTER — Encounter (HOSPITAL_COMMUNITY): Payer: Self-pay | Admitting: Otolaryngology

## 2020-04-23 DIAGNOSIS — N179 Acute kidney failure, unspecified: Secondary | ICD-10-CM | POA: Diagnosis not present

## 2020-04-23 DIAGNOSIS — A419 Sepsis, unspecified organism: Secondary | ICD-10-CM | POA: Diagnosis not present

## 2020-04-23 DIAGNOSIS — F1010 Alcohol abuse, uncomplicated: Secondary | ICD-10-CM | POA: Diagnosis not present

## 2020-04-23 DIAGNOSIS — E876 Hypokalemia: Secondary | ICD-10-CM | POA: Diagnosis not present

## 2020-04-23 LAB — BASIC METABOLIC PANEL
Anion gap: 12 (ref 5–15)
BUN: 11 mg/dL (ref 6–20)
CO2: 32 mmol/L (ref 22–32)
Calcium: 7.9 mg/dL — ABNORMAL LOW (ref 8.9–10.3)
Chloride: 89 mmol/L — ABNORMAL LOW (ref 98–111)
Creatinine, Ser: 0.56 mg/dL — ABNORMAL LOW (ref 0.61–1.24)
GFR, Estimated: >60 mL/min (ref >60)
Glucose, Bld: 112 mg/dL — ABNORMAL HIGH (ref 70–99)
Potassium: 4.1 mmol/L (ref 3.5–5.1)
Sodium: 133 mmol/L — ABNORMAL LOW (ref 135–145)

## 2020-04-23 LAB — CBC
HCT: 37.9 % — ABNORMAL LOW (ref 39.0–52.0)
Hemoglobin: 12.6 g/dL — ABNORMAL LOW (ref 13.0–17.0)
MCH: 30.7 pg (ref 26.0–34.0)
MCHC: 33.2 g/dL (ref 30.0–36.0)
MCV: 92.2 fL (ref 80.0–100.0)
Platelets: 210 10*3/uL (ref 150–400)
RBC: 4.11 MIL/uL — ABNORMAL LOW (ref 4.22–5.81)
RDW: 11.5 % (ref 11.5–15.5)
WBC: 8.7 10*3/uL (ref 4.0–10.5)
nRBC: 0 % (ref 0.0–0.2)

## 2020-04-23 LAB — CULTURE, BLOOD (ROUTINE X 2)
Culture: NO GROWTH
Culture: NO GROWTH
Special Requests: ADEQUATE
Special Requests: ADEQUATE

## 2020-04-23 LAB — GLUCOSE, CAPILLARY: Glucose-Capillary: 134 mg/dL — ABNORMAL HIGH (ref 70–99)

## 2020-04-23 LAB — PHOSPHORUS: Phosphorus: 2.2 mg/dL — ABNORMAL LOW (ref 2.5–4.6)

## 2020-04-23 LAB — MAGNESIUM: Magnesium: 1.5 mg/dL — ABNORMAL LOW (ref 1.7–2.4)

## 2020-04-23 LAB — MRSA PCR SCREENING: MRSA by PCR: NEGATIVE

## 2020-04-23 MED ORDER — POTASSIUM & SODIUM PHOSPHATES 280-160-250 MG PO PACK
2.0000 | PACK | Freq: Three times a day (TID) | ORAL | Status: AC
  Administered 2020-04-23 (×2): 2
  Filled 2020-04-23 (×2): qty 2

## 2020-04-23 MED ORDER — MAGNESIUM SULFATE 4 GM/100ML IV SOLN
4.0000 g | Freq: Once | INTRAVENOUS | Status: AC
  Administered 2020-04-23: 4 g via INTRAVENOUS
  Filled 2020-04-23: qty 100

## 2020-04-23 MED ORDER — LIP MEDEX EX OINT
TOPICAL_OINTMENT | CUTANEOUS | Status: DC | PRN
  Filled 2020-04-23: qty 7

## 2020-04-23 MED ORDER — JEVITY 1.2 CAL PO LIQD
1000.0000 mL | ORAL | Status: DC
  Administered 2020-04-23 – 2020-04-26 (×4): 1000 mL
  Filled 2020-04-23 (×7): qty 1000

## 2020-04-23 MED ORDER — POTASSIUM & SODIUM PHOSPHATES 280-160-250 MG PO PACK
2.0000 | PACK | Freq: Three times a day (TID) | ORAL | Status: DC
  Filled 2020-04-23: qty 2

## 2020-04-23 MED ORDER — PROSOURCE TF PO LIQD
45.0000 mL | Freq: Two times a day (BID) | ORAL | Status: DC
  Administered 2020-04-23 – 2020-04-25 (×5): 45 mL
  Filled 2020-04-23 (×5): qty 45

## 2020-04-23 NOTE — Evaluation (Addendum)
Physical Therapy Evaluation Patient Details Name: Mario Peters MRN: [id number] DOB: 1965-07-19 Today's Date: 04/23/2020   History of Present Illness  Pt is an 55 y.o. male. Admitted 04/22/20 with difficulty with swallowing and weight loss (increased over the past 4 months) dehydration and hypotension as well as infection (pneumonia). 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  Tracheostomy tube (04/22/2020); Direct laryngoscopy (04/22/2020); and esophagoscopy (04/22/2020). PMH includes: Long history of smoking and ETOH, seizures, low health care literacy.  Clinical Impression   Pt admitted secondary to problem above with deficits below. PTA patient was independent with all activity. Pt currently requires up to min assist for safety with mobility. He reports his family will be available to assist him at home.  Will continue to follow acutely to maximize functional mobility independence and safety.    During activity, pt on 2L @ 28% FiO2 with sats 97% at rest. Initial walking on FiO2 28% with decrease in sats to 84% over a short distance. Allowed pt to catch his breath in standing and then returned to room (increased FiO2 to 38% on 6L via venturi attachment used for walking. End of session pt back on FiO2 28% with sats 90-91%.     Follow Up Recommendations Home health PT;Supervision for mobility/OOB    Equipment Recommendations  Rolling walker with 5" wheels    Recommendations for Other Services       Precautions / Restrictions Precautions Precautions: Fall Precaution Comments: trach, NG tube Restrictions Weight Bearing Restrictions: No      Mobility  Bed Mobility               General bed mobility comments: OOB in recliner at beginning and end of session    Transfers Overall transfer level: Needs assistance Equipment used: 1 person hand held assist Transfers: Sit to/from Stand Sit to Stand: Min assist;+2 safety/equipment         General transfer comment: min A  for balance, good hand placement in recliner  Ambulation/Gait Ambulation/Gait assistance: Min assist;+2 safety/equipment Gait Distance (Feet): 40 Feet Assistive device: None Gait Pattern/deviations: Step-through pattern;Wide base of support Gait velocity: slow and appropriate for situation   General Gait Details: shortened steps with slower pace (and wider BOS0  Stairs            Wheelchair Mobility    Modified Rankin (Stroke Patients Only)       Balance Overall balance assessment: Needs assistance Sitting-balance support: No upper extremity supported;Feet supported Sitting balance-[name]: Good     Standing balance support: No upper extremity supported;During functional activity Standing balance-[name]: Fair                               Pertinent Vitals/Pain Pain Assessment: Faces Faces Pain Scale: Hurts whole lot Pain Location: trach site Pain Descriptors / Indicators: Grimacing Pain Intervention(s): Monitored during session    Home Living Family/patient expects to be discharged to:: Private residence Living Arrangements: Spouse/significant other Available Help at Discharge: Family;Available 24 hours/day Type of Home: House Home Access: Level entry     Home Layout: One level Home Equipment: Shower seat - built in Additional Comments: Is planning on dc to daughters house with wife also available to assist    Prior Function Level of Independence: Independent         Comments: no DME for mobility, enjoys classic rock and fishing     Hand Dominance   Dominant Hand:  Right    Extremity/Trunk Assessment   Upper Extremity Assessment Upper Extremity Assessment: Defer to OT evaluation    Lower Extremity Assessment Lower Extremity Assessment: Overall WFL for tasks assessed    Cervical / Trunk Assessment Cervical / Trunk Assessment: Kyphotic  Communication   Communication: Tracheostomy  Cognition Arousal/Alertness:  Awake/alert Behavior During Therapy: WFL for tasks assessed/performed Overall Cognitive Status: Within Functional Limits for tasks assessed                                 General Comments: did a good job using body language to communicate his needs and wants      General Comments General comments (skin integrity, edema, etc.): Pt on venturi attachment for mobility, initially on 3L/28% FiO2 and desaturated to 82 requiring 6L O2 to remain at 89% and higher with activity. HR 80-105 with activity    Exercises     Assessment/Plan    PT Assessment Patient needs continued PT services  PT Problem List Decreased strength;Decreased activity tolerance;Decreased balance;Decreased mobility;Decreased knowledge of use of DME;Cardiopulmonary status limiting activity       PT Treatment Interventions DME instruction;Gait training;Functional mobility training;Therapeutic activities;Therapeutic exercise;Balance training;Patient/family education    PT Goals (Current goals can be found in the Care Plan section)  Acute Rehab PT Goals Patient Stated Goal: Walk more, and get back to fishing PT Goal Formulation: With patient Time For Goal Achievement: 05/07/20 Potential to Achieve Goals: Good    Frequency Min 3X/week   Barriers to discharge        Co-evaluation   Reason for Co-Treatment: For patient/therapist safety;To address functional/ADL transfers PT goals addressed during session: Mobility/safety with mobility;Balance OT goals addressed during session: ADL's and self-care       AM-PAC PT "6 Clicks" Mobility  Outcome Measure Help needed turning from your back to your side while in a flat bed without using bedrails?: None Help needed moving from lying on your back to sitting on the side of a flat bed without using bedrails?: A Little Help needed moving to and from a bed to a chair (including a wheelchair)?: A Little Help needed standing up from a chair using your arms (e.g.,  wheelchair or bedside chair)?: A Little Help needed to walk in hospital room?: A Little Help needed climbing 3-5 steps with a railing? : A Little 6 Click Score: 19    End of Session Equipment Utilized During Treatment: Gait belt;Oxygen Activity Tolerance: Treatment limited secondary to medical complications (Comment) (desaturation) Patient left: in chair;with call bell/phone within reach;with chair alarm set;with family/visitor present Nurse Communication: Mobility status PT Visit Diagnosis: Difficulty in walking, not elsewhere classified (R26.2)    Time: 6387-5643 PT Time Calculation (min) (ACUTE ONLY): 15 min   Charges:   PT Evaluation $PT Eval Moderate Complexity: 1 Mod           Arby Barrette, PT Pager [phone]   [name] 04/23/2020, 1:18 PM

## 2020-04-23 NOTE — Progress Notes (Signed)
Patient looks great.  Okay for 2W, PCCM available if any issues, discussed with primary.  Usual post trach orders are in place.  Erskine Emery MD PCCM

## 2020-04-23 NOTE — Plan of Care (Signed)
  Problem: Respiratory: Goal: Ability to maintain adequate ventilation will improve Outcome: Progressing Goal: Ability to maintain a clear airway will improve Outcome: Progressing   Problem: Elimination: Goal: Will not experience complications related to urinary retention Outcome: Progressing   Problem: Safety: Goal: Ability to remain free from injury will improve Outcome: Progressing   Problem: Skin Integrity: Goal: Risk for impaired skin integrity will decrease Outcome: Progressing   Problem: Nutrition: Goal: Adequate nutrition will be maintained Outcome: Not Progressing Note: Pt currently not on tube feeds. MD ordered Dietician to consult and start them.

## 2020-04-23 NOTE — Progress Notes (Signed)
Pt was transported to and from CT for Abdominal CT with no issues. Possible PEG tube.

## 2020-04-23 NOTE — Progress Notes (Addendum)
Nutrition Follow-up / Consult  DOCUMENTATION CODES:   Severe malnutrition in context of acute illness/injury  INTERVENTION:   Initiate tube feeding via NG tube:  Jevity 1.2 at 30 ml/h, increase by 10 ml every 8 hours to goal rate of 70 ml/h (1680 ml per day)  Prosource TF 45 ml BID  Provides 2096 kcal, 115 gm protein, 1361 ml free water daily  Monitor magnesium, potassium, and phosphorus levels, MD to replete as needed, as pt is at risk for refeeding syndrome given severe malnutrition, low phosphorus and magnesium.  D/C Ensure Enlive, currently NPO.  NUTRITION DIAGNOSIS:   Severe Malnutrition related to acute illness,dysphagia (laryngeal mass) as evidenced by severe fat depletion,severe muscle depletion,per patient/family report.  Ongoing   GOAL:   Patient will meet greater than or equal to 90% of their needs  Progressing   MONITOR:   PO intake,Supplement acceptance,Labs  REASON FOR ASSESSMENT:   Consult Enteral/tube feeding initiation and management  ASSESSMENT:   55 yo male with a PMH of EtOH and tobacco abuse who presents with severe sepsis, AKI, severe dysphagia 2/2 laryngeal mass, and aspiration PNA.  Discussed patient in ICU rounds and with RN today. S/P tracheostomy, direct laryngoscopy with biopsy and NG tube placement on 4/4. 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 Currently on trach collar.    Labs reviewed. Sodium 133, phos 2.2, mag 1.5 Low phos and mag could be related to refeeding syndrome after patient started eating on 3/31. Has been NPO since 4/2.  SLP following for PMV use.  Received MD Consult for TF initiation and management. Plans for PEG placement for long term nutrition support.   Medications reviewed and include Phos-Nak, mag sulfate. IVF: NS with KCl at 50 ml/h  No new weight available; 51.6 kg on 4/1.   Diet Order:   Diet Order            Diet NPO time specified  Diet effective midnight                 EDUCATION NEEDS:    Education needs have been addressed  Skin:  Skin Assessment: Reviewed RN Assessment  Last BM:  04/19/20 - Type 6  Height:   Ht Readings from Last 1 Encounters:  04/19/20 5\' 7"  (1.702 m)    Weight:   Wt Readings from Last 1 Encounters:  04/19/20 51.6 kg    Ideal Body Weight:  67.3 kg  BMI:  Body mass index is 17.82 kg/m.  Estimated Nutritional Needs:   Kcal:  1950-2150  Protein:  100-115 grams  Fluid:  >2 L    Lucas Mallow, RD, LDN, CNSC Please refer to Amion for contact information.

## 2020-04-23 NOTE — Progress Notes (Signed)
PROGRESS NOTE    Mario Peters  KAJ:[id number] DOB: [birth year] DOA: 04/17/2020 PCP: Patient, [name]  Brief Narrative: 54/F with history of alcohol and tobacco abuse, who had never seen a doctor presented to ED with multiple complaints including weakness, ongoing dysphagia with very poor p.o. intake for 4 months, ongoing weight loss, hoarseness, cough congestion and weakness. 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 -ENT consulted, large laryngeal mass noted which was partially compromising his airway -4/4 underwent tracheostomy, direct laryngoscopy with biopsy and NG tube placement   Assessment & Plan:   Severe Sepsis Aspiration pneumonia -Improving, blood pressure stable now, sepsis physiology has resolved -continue cefepime, day 6/7, can stop antibiotics tomorrow -Blood cultures are negative  Large laryngeal mass partially obstructing airway Severe dysphagia -Appreciate ENT input per Dr. 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 -Underwent tracheostomy, direct laryngoscopy and biopsy and nasogastric feeding tube placement yesterday -Transferred to the ICU yesterday evening -Will monitor in ICU today, hopefully can transfer out tomorrow -Will consult trach team -Start tube feeds via NG tube, consult dietitian -SLP consult, Passy-Muir valve etc  Acute kidney injury -Resolved with hydration  Tobacco abuse -Counseled  Alcohol abuse Alcoholic liver disease -Mildly elevated bilirubin, low albumin -Ultrasound does not indicate cirrhosis yet however does have spider nevi on exam  Severe hypokalemia, hypophosphatemia and hypomagnesemia Refeeding syndrome-likely -Aggressively  repleted potassium, phosphorus and mag -Replete mag and Phos  Severe protein calorie malnutrition -Albumin is 1.7, could be secondary to ongoing weight loss from dysphagia, possible malignancy, alcoholic liver disease also contributing  DVT prophylaxis:lovenox Code Status: Full Code, discussed complicated course with patient and family on multiple occasions, he always indicated wanting to be full code and full aggressive scope of management Family Communication: Discussed with patient's sister and wife at bedside yesterday Disposition Plan:  Status is: Inpatient  Remains inpatient appropriate because:Inpatient level of care appropriate due to severity of illness   Dispo: The patient is from: Home              Anticipated d/c is to: Likely SNF              Patient currently is not medically stable to d/c.   Difficult to place patient No  Consultants:   ENT Dr. Constance Holster   Procedures:   Antimicrobials:    Subjective: -No events overnight, sitting up in bed, getting suctioned, frustrated about inability to communicate  Objective: Vitals:   04/23/20 0700 04/23/20 0759 04/23/20 0800 04/23/20 0900  BP: 134/82 134/82 130/74 135/75  Pulse: 62 61 66 (!) 59  Resp: 15 18 20 17   Temp:  98 F (36.7 C)    TempSrc:  Oral    SpO2: 97% 92% 91% 97%  Weight:      Height:        Intake/Output Summary (Last 24 hours) at 04/23/2020 1033 Last data filed at 04/23/2020 1000 Gross per 24 hour  Intake 1649.62 ml  Output 815 ml  Net 834.62 ml   Filed Weights   04/17/20 2132 04/19/20 0736  Weight: 65.8 kg 51.6 kg    Examination:  General exam: Chronically ill cachectic male sitting up in bed, awake alert, no distress HEENT: Tracheostomy with trach collar CVS: S1-S2, regular rhythm Lungs: Few conducted  upper airway sounds  Abdomen: Soft, nontender, bowel sounds present Extremities: No edema  Neuro: Moves all extremities, no localizing signs Skin: Spider nevi on upper chest Psychiatry:  Very poor insight and judgment.     Data Reviewed:   CBC: Recent Labs  Lab 04/17/20 1936 04/18/20 0010 04/20/20 0110 04/21/20 0025 04/22/20 0409 04/22/20 1144 04/22/20 1539 04/23/20 0120  WBC 17.5*   < > 10.7* 12.2* 13.4*  --  13.2* 8.7  NEUTROABS 15.8*  --   --   --   --   --  12.3*  --   HGB 16.5   < > 12.1* 12.7* 12.8* 13.9 12.5* 12.6*  HCT 46.5   < > 35.2* 37.9* 38.9* 41.0 37.9* 37.9*  MCV 86.3   < > 90.0 91.1 91.3  --  91.3 92.2  PLT 244   < > 186 181 217  --  219 210   < > = values in this interval not displayed.   Basic Metabolic Panel: Recent Labs  Lab 04/20/20 0110 04/21/20 0025 04/21/20 0806 04/22/20 0409 04/22/20 1144 04/22/20 1539 04/23/20 0120  NA 129* 132* 132* 133* 132* 134* 133*  K 3.4* 3.4* 3.5 2.9* 3.5 3.4* 4.1  CL 89* 89* 86* 86* 84* 87* 89*  CO2 32 37* 35* 37*  --  37* 32  GLUCOSE 91 76 66* 68* 72 78 112*  BUN 7 <5* <5* 6 6 6 11   CREATININE 0.40* 0.47* 0.57* 0.50* 0.40* 0.53* 0.56*  CALCIUM 7.2* 7.8* 8.0* 8.0*  --  8.1* 7.9*  MG 1.3* 1.4*  --  1.2*  --  1.1* 1.5*  PHOS 2.9 2.5  --  2.9  --  2.2* 2.2*   GFR: Estimated Creatinine Clearance: 77 mL/min (A) (by C-G formula based on SCr of 0.56 mg/dL (L)). Liver Function Tests: Recent Labs  Lab 04/18/20 0700 04/19/20 0139 04/20/20 0110 04/21/20 0025 04/22/20 1539  AST 31 37 35 25 20  ALT 20 19 22 19 17   ALKPHOS 63 76 56 56 60  BILITOT 1.3* 1.0 0.5 0.8 1.1  PROT 5.2* 4.9* 4.9* 5.2* 5.4*  ALBUMIN 1.7* 1.6* 1.7* 1.8* 1.8*   No results for input(s): LIPASE, AMYLASE in the last 168 hours. Recent Labs  Lab 04/17/20 2357  AMMONIA 25   Coagulation Profile: Recent Labs  Lab 04/17/20 1936  INR 1.4*   Cardiac Enzymes: Recent Labs  Lab 04/18/20 0700  CKTOTAL 117   BNP (last 3 results) No results for input(s): PROBNP in the last 8760 hours. HbA1C: No results for input(s): HGBA1C in the last 72 hours. CBG: Recent Labs  Lab 04/21/20 0344  GLUCAP 81   Lipid Profile: No results  for input(s): CHOL, HDL, LDLCALC, TRIG, CHOLHDL, LDLDIRECT in the last 72 hours. Thyroid Function Tests: Recent Labs    04/22/20 1539  TSH 0.602   Anemia Panel: No results for input(s): VITAMINB12, FOLATE, FERRITIN, TIBC, IRON, RETICCTPCT in the last 72 hours. Urine analysis:    Component Value Date/Time   COLORURINE AMBER (A) 04/17/2020 2133   APPEARANCEUR CLOUDY (A) 04/17/2020 2133   LABSPEC 1.014 04/17/2020 2133   PHURINE 5.0 04/17/2020 2133   GLUCOSEU NEGATIVE 04/17/2020 2133   HGBUR SMALL (A) 04/17/2020 2133   BILIRUBINUR NEGATIVE 04/17/2020 2133   KETONESUR NEGATIVE 04/17/2020 2133   PROTEINUR NEGATIVE 04/17/2020 2133   NITRITE NEGATIVE 04/17/2020 2133   LEUKOCYTESUR NEGATIVE 04/17/2020 2133   Sepsis Labs: @LABRCNTIP (procalcitonin:4,lacticidven:4)  ) Recent Results (from the past 240 hour(s))  Blood  Culture (routine x 2)     Status: None   Collection Time: 04/17/20  7:35 PM   Specimen: BLOOD  Result Value Ref Range Status   Specimen Description BLOOD RIGHT ANTECUBITAL  Final   Special Requests   Final    BOTTLES DRAWN AEROBIC AND ANAEROBIC Blood Culture adequate volume   Culture   Final    NO GROWTH 6 DAYS Performed at [facility], [address], [place], [place] [address]    Report Status 04/23/2020 FINAL  Final  Resp Panel by RT-PCR (Flu A&B, Covid) Nasopharyngeal Swab     Status: None   Collection Time: 04/17/20  9:33 PM   Specimen: Nasopharyngeal Swab; Nasopharyngeal(NP) swabs in vial transport medium  Result Value Ref Range Status   SARS Coronavirus 2 by RT PCR NEGATIVE NEGATIVE Final    Comment: (NOTE) SARS-CoV-2 target nucleic acids are NOT DETECTED.  The SARS-CoV-2 RNA is generally detectable in upper respiratory specimens during the acute phase of infection. The lowest concentration of SARS-CoV-2 viral copies this assay can detect is 138 copies/mL. A negative result does not preclude SARS-Cov-2 infection and should not be used as the sole  basis for treatment or other patient management decisions. A negative result may occur with  improper specimen collection/handling, submission of specimen other than nasopharyngeal swab, presence of viral mutation(s) within the areas targeted by this assay, and inadequate number of viral copies(<138 copies/mL). A negative result must be combined with clinical observations, patient history, and epidemiological information. The expected result is Negative.  Fact Sheet for Patients:  EntrepreneurPulse.com.au  Fact Sheet for Healthcare Providers:  IncredibleEmployment.be  This test is no t yet approved or cleared by the [place] FDA and  has been authorized for detection and/or diagnosis of SARS-CoV-2 by FDA under an Emergency Use Authorization (EUA). This EUA will remain  in effect (meaning this test can be used) for the duration of the COVID-19 declaration under Section 564(b)(1) of the Act, 21 U.S.C.section 360bbb-3(b)(1), unless the authorization is terminated  or revoked sooner.       Influenza A by PCR NEGATIVE NEGATIVE Final   Influenza B by PCR NEGATIVE NEGATIVE Final    Comment: (NOTE) The Xpert Xpress SARS-CoV-2/FLU/RSV plus assay is intended as an aid in the diagnosis of influenza from Nasopharyngeal swab specimens and should not be used as a sole basis for treatment. Nasal washings and aspirates are unacceptable for Xpert Xpress SARS-CoV-2/FLU/RSV testing.  Fact Sheet for Patients: EntrepreneurPulse.com.au  Fact Sheet for Healthcare Providers: IncredibleEmployment.be  This test is not yet approved or cleared by the [place] FDA and has been authorized for detection and/or diagnosis of SARS-CoV-2 by FDA under an Emergency Use Authorization (EUA). This EUA will remain in effect (meaning this test can be used) for the duration of the COVID-19 declaration under Section 564(b)(1) of the Act,  21 U.S.C. section 360bbb-3(b)(1), unless the authorization is terminated or revoked.  Performed at [facility], [place] [address], [place], [place] [address]   Blood Culture (routine x 2)     Status: None   Collection Time: 04/17/20 10:07 PM   Specimen: BLOOD RIGHT FOREARM  Result Value Ref Range Status   Specimen Description BLOOD RIGHT FOREARM  Final   Special Requests   Final    BOTTLES DRAWN AEROBIC AND ANAEROBIC Blood Culture adequate volume   Culture   Final    NO GROWTH 6 DAYS Performed at [facility], [place] [address]  [place], [place] [address]    Report Status 04/23/2020 FINAL  Final     Scheduled Meds: . chlorhexidine  15 mL Mouth Rinse BID  . Chlorhexidine Gluconate Cloth  6 each Topical Daily  . enoxaparin (LOVENOX) injection  40 mg Subcutaneous Daily  . feeding supplement  237 mL Oral BID BM  . mouth rinse  15 mL Mouth Rinse q12n4p  . nicotine  21 mg Transdermal Daily  . potassium & sodium phosphates  2 packet Per Tube TID WC & HS   Continuous Infusions: . 0.9 % NaCl with KCl 40 mEq / L 50 mL/hr at 04/23/20 0800  . ceFEPime (MAXIPIME) IV Stopped (04/23/20 0551)  . magnesium sulfate bolus IVPB       LOS: 6 days    Time spent: 67min  Domenic Polite, MD [facility]  04/23/2020, 10:33 AM

## 2020-04-23 NOTE — Evaluation (Signed)
Occupational Therapy Evaluation Patient Details Name: Mario Peters MRN: [id number] DOB: 1965/05/09 Today's Date: 04/23/2020    History of Present Illness Pt is an 55 y.o. male. Admitted 04/22/20 with difficulty with swallowing and weight loss (increased over the past 4 months) dehydration and hypotension as well as infection (pneumonia). CT scan revealed a laryngeal mass (potential .  Tracheostomy tube (04/22/2020); Direct laryngoscopy (04/22/2020); and esophagoscopy (04/22/2020). PMH includes: Long history of smoking and ETOH, seizures, low health care literacy.   Clinical Impression   Pt typically independent in ADL and mobility and enjoys classic rock music and fishing. Today Pt is min A for transfers (+2 for line management and safety with mobility) Wife and daughter present throughout and able to provide home set up (trach) and eager to assist. Pt able to don underwear and pants with min A and perform transfers with min A. Pt able to perform grooming with cues for safety in seated position. Pt did ambulate and required increased O2 (3-6L and 28% with venturi attachment to supplemental O2). Pt eager to walk and work with therapy. At this time recommending [place] post-acute and next session to focus on energy conservation.     Follow Up Recommendations  Home health OT    Equipment Recommendations  3 in 1 bedside commode    Recommendations for Other Services       Precautions / Restrictions Precautions Precautions: Fall Precaution Comments: trach, NG tube Restrictions Weight Bearing Restrictions: No      Mobility Bed Mobility               General bed mobility comments: OOB in recliner at beginning and end of session    Transfers Overall transfer level: Needs assistance Equipment used: 1 person hand held assist Transfers: Sit to/from Stand Sit to Stand: Min assist;+2 safety/equipment         General transfer comment: min A for balance, good hand placement in recliner     Balance Overall balance assessment: Needs assistance Sitting-balance support: No upper extremity supported;Feet supported Sitting balance-[name]: Good     Standing balance support: Single extremity supported;No upper extremity supported;During functional activity Standing balance-[name]: Fair                             ADL either performed or assessed with clinical judgement   ADL Overall ADL's : Needs assistance/impaired Eating/Feeding: NPO   Grooming: Wash/dry face;Oral care;Min guard;Cueing for sequencing;Sitting Grooming Details (indicate cue type and reason): in recliner Upper Body Bathing: Moderate assistance;Sitting Upper Body Bathing Details (indicate cue type and reason): for back Lower Body Bathing: Minimal assistance;Sit to/from stand   Upper Body Dressing : Minimal assistance;Sitting   Lower Body Dressing: Minimal assistance;Sit to/from stand Lower Body Dressing Details (indicate cue type and reason): able to don underwear and PJ pants min A Toilet Transfer: Minimal assistance;Ambulation   Toileting- Clothing Manipulation and Hygiene: Minimal assistance;Sit to/from stand Toileting - Clothing Manipulation Details (indicate cue type and reason): balanace and safety Tub/ Shower Transfer: Minimal assistance;Ambulation;+2 for safety/equipment   Functional mobility during ADLs: Minimal assistance;+2 for safety/equipment General ADL Comments: decreased activity tolerance, balance     Vision Baseline Vision/History: Wears glasses Wears Glasses: At all times Patient Visual Report: No change from baseline Vision Assessment?: No apparent visual deficits     Perception     Praxis      Pertinent Vitals/Pain Pain Assessment: Faces Faces Pain Scale: Hurts a little bit  Pain Location: trach site Pain Descriptors / Indicators: Grimacing Pain Intervention(s): Monitored during session;Repositioned     Hand Dominance Right   Extremity/Trunk  Assessment Upper Extremity Assessment Upper Extremity Assessment: Generalized weakness   Lower Extremity Assessment Lower Extremity Assessment: Defer to PT evaluation   Cervical / Trunk Assessment Cervical / Trunk Assessment: Kyphotic   Communication Communication Communication: Tracheostomy   Cognition Arousal/Alertness: Awake/alert Behavior During Therapy: WFL for tasks assessed/performed Overall Cognitive Status: Within Functional Limits for tasks assessed                                 General Comments: did a good job using body language to communicate his needs and wants   General Comments  Pt on venturi attachment for mobility, initially on 3L/28% FiO2 and desaturated to 82 requiring 6L O2 to remain at 89% and higher with activity. HR 80-105 with activity    Exercises     Shoulder Instructions      Home Living Family/patient expects to be discharged to:: Private residence Living Arrangements: Spouse/significant other Available Help at Discharge: Family;Available 24 hours/day Type of Home: House Home Access: Level entry     Home Layout: One level     Bathroom Shower/Tub: Occupational psychologist: Standard     Home Equipment: Shower seat - built in   Additional Comments: Is planning on dc to daughters house with wife also available to assist      Prior Functioning/Environment Level of Independence: Independent        Comments: no DME for mobility, enjoys classic rock and fishing        OT Problem List: Decreased strength;Decreased activity tolerance;Impaired balance (sitting and/or standing);Decreased knowledge of use of DME or AE;Decreased knowledge of precautions;Cardiopulmonary status limiting activity      OT Treatment/Interventions: Self-care/ADL training;Therapeutic exercise;Energy conservation;DME and/or AE instruction;Therapeutic activities;Patient/family education;Balance training    OT Goals(Current goals can be found  in the care plan section) Acute Rehab OT Goals Patient Stated Goal: Walk more, and get back to fishing OT Goal Formulation: With patient/family Time For Goal Achievement: 05/07/20 Potential to Achieve Goals: Good ADL Goals Pt Will Perform Grooming: standing;with supervision Pt Will Perform Upper Body Dressing: with modified independence;sitting Pt Will Perform Lower Body Dressing: with supervision;sit to/from stand Pt Will Transfer to Toilet: with supervision;ambulating Pt Will Perform Toileting - Clothing Manipulation and hygiene: sit to/from stand;with supervision Additional ADL Goal #1: Pt will recall 3 ways of conserving energy during ADL with zero cues  OT Frequency: Min 2X/week   Barriers to D/C:            Co-evaluation PT/OT/SLP Co-Evaluation/Treatment: Yes Reason for Co-Treatment: For patient/therapist safety;To address functional/ADL transfers PT goals addressed during session: Mobility/safety with mobility;Balance OT goals addressed during session: ADL's and self-care      AM-PAC OT "6 Clicks" Daily Activity     Outcome Measure Help from another person eating meals?: Total (NPO) Help from another person taking care of personal grooming?: A Little Help from another person toileting, which includes using toliet, bedpan, or urinal?: A Little Help from another person bathing (including washing, rinsing, drying)?: A Little Help from another person to put on and taking off regular upper body clothing?: A Little Help from another person to put on and taking off regular lower body clothing?: A Little 6 Click Score: 16   End of Session Equipment Utilized During Treatment: Gait belt;Oxygen (  3-6L on 28% FiO2) Nurse Communication: Mobility status;Precautions  Activity Tolerance: Patient tolerated treatment well Patient left: in chair;with call bell/phone within reach;with family/visitor present  OT Visit Diagnosis: Unsteadiness on feet (R26.81);Muscle weakness (generalized)  (M62.81)                Time: 2518-9842 OT Time Calculation (min): 30 min Charges:  OT General Charges $OT Visit: 1 Visit OT Evaluation $OT Eval Moderate Complexity: [place] OTR/L Acute Rehabilitation Services Pager: [phone] Office: [place] 04/23/2020, 11:40 AM

## 2020-04-23 NOTE — Progress Notes (Signed)
ENT follow-up  Postop day 1, tracheostomy, laryngoscopy and biopsies.  Doing well, sitting up in the chair awake and alert.  Breathing much easier with tracheostomy.  Tracheostomy is in good position.  I deflated the cuff.  He had large amount of mucoid secretions but he is able to cough that up effectively and I showed him how to suction the tracheostomy tube.  Stable tracheostomy.  Await pathology results.  We will determine treatment following that.  May transfer out of ICU.  May start on tube feeds through the nasogastric tube.  I will change to a cuffless tracheostomy on Friday.

## 2020-04-23 NOTE — Anesthesia Postprocedure Evaluation (Signed)
Anesthesia Post Note  Patient: Mario Peters  Procedure(s) Performed: TRACHEOSTOMY (N/A ) DIRECT LARYNGOSCOPY WITH BIOPSY (N/A Mouth) ESOPHAGOSCOPY (Throat)     Patient location during evaluation: PACU Anesthesia Type: General Level of consciousness: awake and patient cooperative Pain management: pain level controlled Vital Signs Assessment: post-procedure vital signs reviewed and stable Respiratory status: spontaneous breathing, nonlabored ventilation, respiratory function stable and patient connected to tracheostomy mask oxygen Cardiovascular status: blood pressure returned to baseline and stable Postop Assessment: no apparent nausea or vomiting Anesthetic complications: no   No complications documented.  Last Vitals:  Vitals:   04/23/20 1937 04/23/20 2035  BP: (!) 92/53   Pulse: 88 90  Resp: 17 (!) 21  Temp: 37.1 C   SpO2: 100% 100%    Last Pain:  Vitals:   04/23/20 1937  TempSrc: Oral  PainSc:                  [name]

## 2020-04-23 NOTE — Evaluation (Signed)
Passy-Muir Speaking Valve - Evaluation Patient Details  Name: Mario Peters MRN: [id number] Date of Birth: 01-Apr-1965  Today's Date: 04/23/2020 Time: 1045-1106 SLP Time Calculation (min) (ACUTE ONLY): 21 min  Past Medical History:  Past Medical History:  Diagnosis Date  . Seizures ([place])    Past Surgical History:  Past Surgical History:  Procedure Laterality Date  . DIRECT LARYNGOSCOPY N/A 04/22/2020   Procedure: DIRECT LARYNGOSCOPY WITH BIOPSY;  Surgeon: [name], MD;  Location: [place];  Service: ENT;  Laterality: N/A;  . ESOPHAGOSCOPY  04/22/2020   Procedure: ESOPHAGOSCOPY;  Surgeon: [name], MD;  Location: [place];  Service: ENT;;  . TRACHEOSTOMY TUBE PLACEMENT N/A 04/22/2020   Procedure: TRACHEOSTOMY;  Surgeon: [name], MD;  Location: [place];  Service: ENT;  Laterality: N/A;   HPI:  Pt is a 55 y.o. male with medical history significant of EtOH abuse,  tobacco abuse, remote hx of seizures. He presented via EMS due to inability to get out of bed, weight loss, and productive cough x4 months and increased difficulty swallowing. CXR 3/30: Ill-defined left greater than right reticulonodular and ground-glass opacity, suspicious for bilateral pneumonia, potentially atypical or viral pneumonia. CT soft tissue: Mildly hyperdense focus at the posterior aspect of the left vocal fold with associated soft tissue thickening. 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 airway is partially compromised.Trach recommended.  Pt was to have trach placed on Friday 4/1 but it was cancelled, is now tentatively scheduled for [place] 4/4.  Swallow evaluation ordered and notes indicate pt overtly coughing while consuming potassium today.Trach placed 4/4, laryngoscopy report showed "There is a large  mass completely obliterating the endolarynx.  There were no recognizable structures.  It spilled over along the lateral pharyngeal wall on the right side.  The left piriform sinus appeared to be clear."   Assessment / Plan / Recommendation Clinical Impression  Pt demonstrates moderate struggle with cuff deflation and PMSV placement. He was tired at time of session after walking with therapy with O2 sats in high 80s low 90s. With slow cuff delatation pt had significant mobilization of secretions with tracheal expectoration of runny secretions for several minutes with O2 sats dropping further with effort. RN provided tracheal suction, pt able to achieve 02 sats around 89/90 at that time. PMSV placed with initial hard cough, but with gentle breathing techniques pt could tolerate placement without coughing for about 5 mintues. Sats remained in the mid to high 80s during trial. Pt reported sensation of resistance, but said "its good to talk" and was able to chit chat with adequate volume and moderate dysphonia for several minutes before coughing off valve. After session RN reported pts sats stayed low despite increased O2 so RT inflated cuff as well. Pt to wear PMSV with SLP only. Hopeful pts cuff can be deflated as prolonged cuff inflation will lead to pooled secretions above cuff and complication with PMSV trials and swallowing therapy. Tentatively planning on MBS tomorrow. NG tube should be removed before MBS and Cortrak can be placed if needed after study. SLP Visit Diagnosis: Aphonia (R49.1);Dysphagia, pharyngeal phase (R13.13)    SLP Assessment  Patient needs continued Speech Lanaguage Pathology Services    Follow Up Recommendations       Frequency and Duration min 2x/week  2 weeks    PMSV Trial  PMSV was placed for: 5 minutes Able to redirect subglottic air through upper airway: Yes Able to Attain Phonation: Yes Voice Quality: Hoarse Able to Expectorate Secretions: Yes Level of Secretion  Expectoration with PMSV: Oral;Tracheal Breath Support for Phonation: Adequate Intelligibility: Intelligible Respirations During Trial: 20 SpO2 During Trial: (!) 85 %   Tracheostomy Tube  Additional Tracheostomy Tube Assessment Trach Collar Period: all waking hours Secretion Description: runny yellow pink Level of Secretion Expectoration: Tracheal    Vent Dependency  Vent Dependent: No FiO2 (%): 80 %    Cuff Deflation Trial  GO Tolerated Cuff Deflation: Yes Length of Time for Cuff Deflation Trial: 20 Behavior: Alert;Cooperative        Jasimine Simms, Katherene Ponto 04/23/2020, 12:51 PM

## 2020-04-23 NOTE — Consult Note (Signed)
Chief Complaint: Patient was seen in consultation today for dysphagia/percutaneous gastrostomy tube placement.  Referring Physician(s): Domenic Polite [facility])  Supervising Physician: Corrie Mckusick  Patient Status: [facility] - In-pt  History of Present Illness: Mario Peters is a 55 y.o. male with a past medical history of seizures and alcohol/tobacco abuse. 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 Per notes, patient has had generalized weakness (to point where he cannot get out of bed), weight loss, difficulty swallowing, and productive cough x 4 months. ENT was consulted on admission and patient underwent awake tracheostomy, direct laryngoscopy with biopsy, and esophagoscopy with biopsy (results pending) 04/22/2020 by Dr. Constance Holster. Due to severe protein calorie malnutrition and need of nutritional support, IR consult recommended for possible gastrostomy tube placement.  IR consulted by Dr. Broadus John for possible image-guided percutaneous gastrostomy tube placement. Patient awake and alert sitting in bed, tracheostomy and NGT in place. Passy-Muir valve not in place, nods appropriately to yes/no questions. No complaints. Wife and daughter at bedside, sister present via speaker phone. Denies fever, chills, chest pain, dyspnea, abdominal pain, or headache.   Past Medical History:  Diagnosis Date  . Seizures ([place])     Past Surgical History:  Procedure Laterality Date  . DIRECT LARYNGOSCOPY N/A 04/22/2020   Procedure: DIRECT LARYNGOSCOPY WITH BIOPSY;  Surgeon: [name], MD;  Location: [place];  Service: ENT;  Laterality: N/A;  . ESOPHAGOSCOPY  04/22/2020   Procedure: ESOPHAGOSCOPY;  Surgeon: [name], MD;  Location: [place];  Service: ENT;;  . TRACHEOSTOMY TUBE PLACEMENT N/A 04/22/2020   Procedure: TRACHEOSTOMY;  Surgeon: [name], MD;  Location: [place];  Service: ENT;  Laterality: N/A;     Allergies: Patient has no known allergies.  Medications: Prior to Admission medications   Not on File     Family History  Problem Relation Age of Onset  . Hypertension Other     Social History   Socioeconomic History  . Marital status: Single    Spouse name: Not on file  . Number of children: Not on file  . Years of education: Not on file  . Highest education level: Not on file  Occupational History  . Not on file  Tobacco Use  . Smoking status: Current Every Day Smoker  . Smokeless tobacco: Never Used  Vaping Use  . Vaping Use: Never used  Substance and Sexual Activity  . Alcohol use: Not Currently  . Drug use: Never  . Sexual activity: Not on file  Other Topics Concern  . Not on file  Social History Narrative  . Not on file   Social Determinants of Health   Financial Resource Strain: Not on file  Food Insecurity: Not on file  Transportation Needs: Not on file  Physical Activity: Not on file  Stress: Not on file  Social Connections: Not on file     Review of Systems: A 12 point ROS discussed and pertinent positives are indicated in the HPI above.  All other systems are negative.  Review of Systems  Constitutional: Negative for chills and fever.  Respiratory: Negative for shortness of breath and wheezing.   Cardiovascular: Negative for chest pain and palpitations.  Gastrointestinal: Negative for abdominal pain.  Neurological: Negative for headaches.  Psychiatric/Behavioral: Negative for behavioral problems and confusion.    Vital Signs: BP 135/75   Pulse 66   Temp 98.4 F (36.9 C) (Oral)   Resp (!) 23  Ht 5\' 7"  (1.702 m)   Wt 113 lb 12.8 oz (51.6 kg)   SpO2 93%   BMI 17.82 kg/m   Physical Exam Vitals and nursing note reviewed.  Constitutional:      General: He is not in acute distress.    Comments: Tracheostomy.  HENT:     Nose:     Comments: (+) NGT. Cardiovascular:     Rate and Rhythm: Normal rate and regular rhythm.     Heart  sounds: Normal heart sounds. No murmur heard.   Pulmonary:     Effort: Pulmonary effort is normal. No respiratory distress.     Breath sounds: Normal breath sounds. No wheezing.     Comments: Tracheostomy. Skin:    General: Skin is warm and dry.  Neurological:     Mental Status: He is alert and oriented to person, place, and time.     Comments: Passy-Muir valve not in place, nods appropriately to yes/no questions.      MD Evaluation Airway: Other (comments) (Tracheostomy.) Heart: WNL Abdomen: WNL Chest/ Lungs: WNL ASA  Classification: 3 Mallampati/Airway Score: Three (Tracheostomy.)   Imaging: CT ABDOMEN WO CONTRAST  Result Date: 04/23/2020 CLINICAL DATA:  Assess anatomy for gastrostomy tube placement EXAM: CT ABDOMEN WITHOUT CONTRAST TECHNIQUE: Multidetector CT imaging of the abdomen was performed following the standard protocol without IV contrast. COMPARISON:  CT chest 04/18/2020 FINDINGS: Lower chest: Trace bilateral pleural effusions. Bilateral lower lobe atelectasis with superimposed peripheral consolidation in a peribronchovascular distribution. In the setting of diffuse lower lobe bronchial wall thickening, findings are concerning for aspiration. More mild peribronchovascular ground-glass attenuation opacities in the inferior aspect of the middle lobe and lingula. The visualized cardiac structures are normal in size. No pericardial effusion. Hepatobiliary: Normal hepatic contour and morphology. No discrete hepatic lesion. High density material present within the gallbladder lumen likely representing sludge and/or small stones. No biliary ductal dilatation. Pancreas: Unremarkable. No pancreatic ductal dilatation or surrounding inflammatory changes. Spleen: Normal in size without focal abnormality. Peripheral linear calcification along the splenic margin. Adrenals/Urinary Tract: Unremarkable adrenal glands and kidneys. No hydronephrosis or nephrolithiasis. Stomach/Bowel: Gastric tube  is present. The tip of the gastric tube is in the proximal duodenum. Normal gastric anatomy. There is mild colonic interposition at the splenic flexure. Excellent visualization of the colon on the scout radiograph. Vascular/Lymphatic: Limited evaluation in the absence of intravenous contrast. Aortic atherosclerosis is present. No aneurysm. No suspicious lymphadenopathy. Other: No abdominal wall hernia or abnormality. Musculoskeletal: No acute fracture or aggressive appearing lytic or blastic osseous lesion. IMPRESSION: 1. Anatomy is suitable for percutaneous gastrostomy tube placement. 2. Probable aspiration changes in the bilateral lower lobes, and to a lesser extent in the inferior middle lobe and lingula. 3. Trace bilateral pleural effusions. 4. The tip of the gastric tube is in the proximal duodenum. 5. Sludge and/or small stones in the gallbladder. 6.  Aortic Atherosclerosis (ICD10-170.0). Electronically Signed   By: Jacqulynn Cadet M.D.   On: 04/23/2020 07:05   CT SOFT TISSUE NECK WO CONTRAST  Result Date: 04/18/2020 CLINICAL DATA:  Fatigue, cough and weakness EXAM: CT NECK WITHOUT CONTRAST TECHNIQUE: Multidetector CT imaging of the neck was performed following the standard protocol without intravenous contrast. COMPARISON:  None. FINDINGS: Pharynx and larynx: There is a mildly hyperdense focus at the posterior aspect of the left vocal fold with associated soft tissue thickening (3:95). The epiglottis is normal. Pharynx is normal. Salivary glands: No inflammation, mass, or stone. Thyroid: Normal Lymph nodes: None enlarged  or abnormal density. Vascular: Negative. Limited intracranial: Negative. Visualized orbits: Negative. Mastoids and visualized paranasal sinuses: Mild left maxillary mucosal disease. Skeleton: No acute or aggressive process. Upper chest: Hazy opacities in the left apex. Mild emphysema. Other: None. IMPRESSION: 1. Mildly hyperdense focus at the posterior aspect of the left vocal fold with  associated soft tissue thickening. 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 Correlation with direct visualization is recommended. 2. Hazy opacities in the left apex, which may indicate infection. Emphysema (ICD10-J43.9). Electronically Signed   By: Ulyses Jarred M.D.   On: 04/18/2020 00:28   CT CHEST WO CONTRAST  Result Date: 04/18/2020 CLINICAL DATA:  Follow-up abnormal chest x-ray EXAM: CT CHEST WITHOUT CONTRAST TECHNIQUE: Multidetector CT imaging of the chest was performed following the standard protocol without IV contrast. COMPARISON:  Chest x-ray from the previous day. FINDINGS: Cardiovascular: Somewhat limited due to lack of IV contrast. Mild atherosclerotic calcifications are noted in the thoracic aorta. No aneurysmal dilatation is noted. No cardiac enlargement is seen. Mild coronary calcifications are noted. The pulmonary artery as visualized appears within normal limits. Mediastinum/Nodes: Thoracic inlet shows some mild fullness in the region of the vocal cords better evaluated on recent CT of the neck. Thyroid is within normal limits. Scattered small mediastinal lymph nodes are noted. The esophagus is within normal limits. Lungs/Pleura: Lungs are well aerated bilaterally with a combination of fine ground-glass opacities as well as more nodular tree-in-bud changes throughout the lower lobes left greater than right. These changes are consistent with diffuse pneumonia. There are worsened in the interval from the prior chest x-ray. Possibility of superimposed aspiration would deserve consideration as well as there is considerable inspissated material within the bronchial tree particularly in the left lower lobe. Upper Abdomen: Visualized upper abdomen shows no acute abnormality. Calcification along the margin of the spleen is noted. This may be related to prior trauma. Musculoskeletal: Degenerative changes of the thoracic spine are noted. IMPRESSION: Diffuse bilateral pulmonary parenchymal changes consistent  with multifocal pneumonia. Possibility of superimposed aspiration deserves consideration as well given inspissated material within the lower lobe bronchial tree particularly on the left. Correlate clinically as to any immunocompromised state. Fullness in the region of the vocal cords incompletely evaluated on this exam. Aortic Atherosclerosis (ICD10-I70.0). Electronically Signed   By: Inez Catalina M.D.   On: 04/18/2020 00:20   DG Chest Port 1 View  Result Date: 04/22/2020 CLINICAL DATA:  55 year old male status post tracheostomy placement. EXAM: PORTABLE CHEST 1 VIEW COMPARISON:  Chest radiograph dated 04/17/2020 and CT dated 09/17/2020 FINDINGS: Tracheostomy with tip approximately 4.5 cm above the carina. Enteric tube extends below the diaphragm with tip beyond the margin of the image. Bilateral pulmonary nodularity primarily involving the lower lung fields in keeping with known pneumonia. No significant pleural effusion. No pneumothorax. No acute osseous pathology. Old healed fracture deformity of the left humeral neck. IMPRESSION: Tracheostomy above the carina. Electronically Signed   By: Anner Crete M.D.   On: 04/22/2020 17:47   DG Chest Port 1 View  Result Date: 04/17/2020 CLINICAL DATA:  Weight loss cough EXAM: PORTABLE CHEST 1 VIEW COMPARISON:  None. FINDINGS: Hyperinflation. Ill-defined left greater than right reticulonodular and ground-glass opacity. No pleural effusion. Normal heart size. No pneumothorax. IMPRESSION: Ill-defined left greater than right reticulonodular and ground-glass opacity, suspicious for bilateral pneumonia, potentially atypical or viral pneumonia. Electronically Signed   By: Donavan Foil M.D.   On: 04/17/2020 19:14   US Abdomen Limited RUQ (LIVER/GB)  Result Date: 04/18/2020  CLINICAL DATA:  Alcohol abuse. EXAM: ULTRASOUND ABDOMEN LIMITED RIGHT UPPER QUADRANT COMPARISON:  None. FINDINGS: Gallbladder: Gallbladder sludge fills the majority of the lumen of the  gallbladder. No gallstones or wall thickening visualized. No pericholecystic fluid. No sonographic Murphy sign noted by sonographer. Common bile duct: Diameter: 3 mm. Liver: No focal lesion identified. Within normal limits in parenchymal echogenicity. Portal vein is patent on color Doppler imaging with normal direction of blood flow towards the liver. Other: None. IMPRESSION: Gallbladder sludge fills the majority of the lumen of the gallbladder. No associate findings of acute cholecystitis or choledocholithiasis. Electronically Signed   By: Iven Finn M.D.   On: 04/18/2020 02:16    Labs:  CBC: Recent Labs    04/21/20 0025 04/22/20 0409 04/22/20 1144 04/22/20 1539 04/23/20 0120  WBC 12.2* 13.4*  --  13.2* 8.7  HGB 12.7* 12.8* 13.9 12.5* 12.6*  HCT 37.9* 38.9* 41.0 37.9* 37.9*  PLT 181 217  --  219 210    COAGS: Recent Labs    04/17/20 1936 04/17/20 2357  INR 1.4*  --   APTT 30 25    BMP: Recent Labs    04/21/20 0806 04/22/20 0409 04/22/20 1144 04/22/20 1539 04/23/20 0120  NA 132* 133* 132* 134* 133*  K 3.5 2.9* 3.5 3.4* 4.1  CL 86* 86* 84* 87* 89*  CO2 35* 37*  --  37* 32  GLUCOSE 66* 68* 72 78 112*  BUN <5* 6 6 6 11   CALCIUM 8.0* 8.0*  --  8.1* 7.9*  CREATININE 0.57* 0.50* 0.40* 0.53* 0.56*  GFRNONAA >60 >60  --  >60 >60    LIVER FUNCTION TESTS: Recent Labs    04/19/20 0139 04/20/20 0110 04/21/20 0025 04/22/20 1539  BILITOT 1.0 0.5 0.8 1.1  AST 37 35 25 20  ALT 19 22 19 17   ALKPHOS 76 56 56 60  PROT 4.9* 4.9* 5.2* 5.4*  ALBUMIN 1.6* 1.7* 1.8* 1.8*     Assessment and Plan:  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 Patient will be NPO at midnight including tube feeds. Afebrile and WBCs WNL. Will hold Lovenox per IR protocol. INR 1.4 04/17/2020. Ancef on call for peri-procedural use.  Risks and benefits discussed with the patient including, but not limited to the need for a barium enema during the  procedure, bleeding, infection, peritonitis, or damage to adjacent structures. All of the patient's questions were answered, patient is agreeable to proceed. Consent signed and in IR control room.   Thank you for this interesting consult.  I greatly enjoyed meeting Mario Peters and look forward to participating in their care.  A copy of this report was sent to the requesting provider on this date.  Electronically Signed: Earley Abide, PA-C 04/23/2020, 1:39 PM   I spent a total of 40 Minutes in face to face in clinical consultation, greater than 50% of which was counseling/coordinating care for dysphagia/percutaneous gastrostomy tube placement.

## 2020-04-23 NOTE — Progress Notes (Signed)
RT called to bedside by RN due to a maintained desat of 87%.  Trach cuff was re-inflated and oxygen increased to 60% 10L, sats improved to 90%.  Oxygen increased to 80%, 10L sats improved to 93%.

## 2020-04-24 ENCOUNTER — Inpatient Hospital Stay (HOSPITAL_COMMUNITY): Payer: Medicaid Other

## 2020-04-24 DIAGNOSIS — E871 Hypo-osmolality and hyponatremia: Secondary | ICD-10-CM | POA: Diagnosis not present

## 2020-04-24 DIAGNOSIS — J387 Other diseases of larynx: Secondary | ICD-10-CM | POA: Diagnosis not present

## 2020-04-24 DIAGNOSIS — E43 Unspecified severe protein-calorie malnutrition: Secondary | ICD-10-CM

## 2020-04-24 HISTORY — PX: IR GASTROSTOMY TUBE MOD SED: IMG625

## 2020-04-24 LAB — BASIC METABOLIC PANEL
Anion gap: 3 — ABNORMAL LOW (ref 5–15)
Anion gap: 4 — ABNORMAL LOW (ref 5–15)
BUN: 12 mg/dL (ref 6–20)
BUN: 7 mg/dL (ref 6–20)
CO2: 35 mmol/L — ABNORMAL HIGH (ref 22–32)
CO2: 33 mmol/L — ABNORMAL HIGH (ref 22–32)
Calcium: 7.6 mg/dL — ABNORMAL LOW (ref 8.9–10.3)
Calcium: 7.7 mg/dL — ABNORMAL LOW (ref 8.9–10.3)
Chloride: 97 mmol/L — ABNORMAL LOW (ref 98–111)
Chloride: 97 mmol/L — ABNORMAL LOW (ref 98–111)
Creatinine, Ser: 0.38 mg/dL — ABNORMAL LOW (ref 0.61–1.24)
Creatinine, Ser: 0.39 mg/dL — ABNORMAL LOW (ref 0.61–1.24)
GFR, Estimated: >60 mL/min (ref >60)
GFR, Estimated: >60 mL/min (ref >60)
Glucose, Bld: 123 mg/dL — ABNORMAL HIGH (ref 70–99)
Glucose, Bld: 102 mg/dL — ABNORMAL HIGH (ref 70–99)
Potassium: 3.9 mmol/L (ref 3.5–5.1)
Potassium: 4.3 mmol/L (ref 3.5–5.1)
Sodium: 135 mmol/L (ref 135–145)
Sodium: 134 mmol/L — ABNORMAL LOW (ref 135–145)

## 2020-04-24 LAB — PHOSPHORUS
Phosphorus: <1 mg/dL — CL (ref 2.5–4.6)
Phosphorus: 3 mg/dL (ref 2.5–4.6)

## 2020-04-24 LAB — GLUCOSE, CAPILLARY
Glucose-Capillary: 97 mg/dL (ref 70–99)
Glucose-Capillary: 132 mg/dL — ABNORMAL HIGH (ref 70–99)
Glucose-Capillary: 93 mg/dL (ref 70–99)
Glucose-Capillary: 94 mg/dL (ref 70–99)
Glucose-Capillary: 95 mg/dL (ref 70–99)

## 2020-04-24 LAB — CBC
HCT: 34.7 % — ABNORMAL LOW (ref 39.0–52.0)
Hemoglobin: 11.4 g/dL — ABNORMAL LOW (ref 13.0–17.0)
MCH: 30.7 pg (ref 26.0–34.0)
MCHC: 32.9 g/dL (ref 30.0–36.0)
MCV: 93.5 fL (ref 80.0–100.0)
Platelets: 237 10*3/uL (ref 150–400)
RBC: 3.71 MIL/uL — ABNORMAL LOW (ref 4.22–5.81)
RDW: 11.8 % (ref 11.5–15.5)
WBC: 9.3 10*3/uL (ref 4.0–10.5)
nRBC: 0 % (ref 0.0–0.2)

## 2020-04-24 LAB — MAGNESIUM: Magnesium: 1.6 mg/dL — ABNORMAL LOW (ref 1.7–2.4)

## 2020-04-24 MED ORDER — FENTANYL CITRATE (PF) 100 MCG/2ML IJ SOLN
INTRAMUSCULAR | Status: AC
  Filled 2020-04-24: qty 2

## 2020-04-24 MED ORDER — POTASSIUM & SODIUM PHOSPHATES 280-160-250 MG PO PACK
2.0000 | PACK | Freq: Three times a day (TID) | ORAL | Status: DC
  Administered 2020-04-24 – 2020-04-26 (×10): 2
  Filled 2020-04-24 (×12): qty 2

## 2020-04-24 MED ORDER — SODIUM CHLORIDE 0.9 % IV SOLN
INTRAVENOUS | Status: AC | PRN
  Administered 2020-04-24: 10 mL/h via INTRAVENOUS

## 2020-04-24 MED ORDER — GLUCAGON HCL RDNA (DIAGNOSTIC) 1 MG IJ SOLR
INTRAMUSCULAR | Status: AC
  Filled 2020-04-24: qty 1

## 2020-04-24 MED ORDER — IOHEXOL 300 MG/ML  SOLN
50.0000 mL | Freq: Once | INTRAMUSCULAR | Status: AC | PRN
  Administered 2020-04-24: 20 mL

## 2020-04-24 MED ORDER — MIDAZOLAM HCL 2 MG/2ML IJ SOLN
INTRAMUSCULAR | Status: AC | PRN
  Administered 2020-04-24: 1 mg via INTRAVENOUS

## 2020-04-24 MED ORDER — MORPHINE SULFATE (PF) 2 MG/ML IV SOLN
1.0000 mg | INTRAVENOUS | Status: DC | PRN
  Administered 2020-04-27 – 2020-04-29 (×3): 1 mg via INTRAVENOUS
  Filled 2020-04-24 (×3): qty 1

## 2020-04-24 MED ORDER — LIDOCAINE HCL (PF) 1 % IJ SOLN
INTRAMUSCULAR | Status: AC | PRN
  Administered 2020-04-24: 10 mL

## 2020-04-24 MED ORDER — CEFAZOLIN SODIUM-DEXTROSE 2-4 GM/100ML-% IV SOLN
INTRAVENOUS | Status: AC
  Administered 2020-04-24: 2 g via INTRAVENOUS
  Filled 2020-04-24: qty 100

## 2020-04-24 MED ORDER — MAGNESIUM SULFATE 2 GM/50ML IV SOLN
2.0000 g | Freq: Once | INTRAVENOUS | Status: AC
  Administered 2020-04-24: 2 g via INTRAVENOUS
  Filled 2020-04-24: qty 50

## 2020-04-24 MED ORDER — CEFAZOLIN SODIUM-DEXTROSE 2-4 GM/100ML-% IV SOLN
2.0000 g | Freq: Once | INTRAVENOUS | Status: AC
  Administered 2020-04-24: 2 g via INTRAVENOUS
  Filled 2020-04-24: qty 100

## 2020-04-24 MED ORDER — LIDOCAINE-EPINEPHRINE 1 %-1:100000 IJ SOLN
INTRAMUSCULAR | Status: AC
  Filled 2020-04-24: qty 1

## 2020-04-24 MED ORDER — CEFAZOLIN SODIUM-DEXTROSE 2-4 GM/100ML-% IV SOLN: 2.0000 g | Freq: Once | INTRAVENOUS | Status: AC

## 2020-04-24 MED ORDER — POTASSIUM PHOSPHATES 15 MMOLE/5ML IV SOLN
30.0000 mmol | Freq: Once | INTRAVENOUS | Status: AC
  Administered 2020-04-24: 30 mmol via INTRAVENOUS
  Filled 2020-04-24: qty 10

## 2020-04-24 MED ORDER — MIDAZOLAM HCL 2 MG/2ML IJ SOLN
INTRAMUSCULAR | Status: AC
  Filled 2020-04-24: qty 2

## 2020-04-24 MED ORDER — FENTANYL CITRATE (PF) 100 MCG/2ML IJ SOLN
INTRAMUSCULAR | Status: AC | PRN
  Administered 2020-04-24: 50 ug via INTRAVENOUS

## 2020-04-24 MED ORDER — MORPHINE SULFATE (PF) 2 MG/ML IV SOLN: 2.0000 mg | INTRAVENOUS | Status: DC | PRN

## 2020-04-24 MED ORDER — GLUCAGON HCL RDNA (DIAGNOSTIC) 1 MG IJ SOLR
INTRAMUSCULAR | Status: AC | PRN
  Administered 2020-04-24: 1 mg via INTRAVENOUS

## 2020-04-24 NOTE — Progress Notes (Signed)
SLP Cancellation Note  Patient Details Name: Mario Peters MRN: [id number] DOB: [birth year]   Cancelled treatment:       Reason Eval/Treat Not Completed: Patient at procedure or test/unavailable. PEG tube planned for today. Will defer MBS until tomorrow at earliest. Hopeful that pt will be able to safely take sips for comfort after MBS.    Jacqualin Shirkey, Katherene Ponto 04/24/2020, 8:35 AM

## 2020-04-24 NOTE — Progress Notes (Addendum)
MEDICATION RELATED CONSULT NOTE - FOLLOW UP   Pharmacy Consult for  Post IR procedure consult - Anticoagulant/Antiplatelet PTA/Inpatient Med List Review    No Known Allergies  Patient Measurements: Height: 5\' 7"  (170.2 cm) Weight: 51.6 kg (113 lb 12.8 oz) IBW/kg (Calculated) : 66.1   Vital Signs: Temp: 98.9 F (37.2 C) (04/06 0815) Temp Source: Oral (04/06 0815) BP: 111/69 (04/06 1045) Pulse Rate: 105 (04/06 1045) Intake/Output from previous day: 04/05 0701 - 04/06 0700 In: 991.3 [I.V.:495; NG/GT:296.3; IV Piggyback:199.9] Out: 900 [Urine:900] Intake/Output from this shift: Total I/O In: 0  Out: 300 [Urine:300]  Labs: Recent Labs    04/22/20 1539 04/23/20 0120 04/24/20 0305  WBC 13.2* 8.7 9.3  HGB 12.5* 12.6* 11.4*  HCT 37.9* 37.9* 34.7*  PLT 219 210 237  CREATININE 0.53* 0.56* 0.38*  MG 1.1* 1.5* 1.6*  PHOS 2.2* 2.2* <1.0*  ALBUMIN 1.8*  --   --   PROT 5.4*  --   --   AST 20  --   --   ALT 17  --   --   ALKPHOS 60  --   --   BILITOT 1.1  --   --    Estimated Creatinine Clearance: 77 mL/min (A) (by C-G formula based on SCr of 0.38 mg/dL (L)).    Medications:  No medications prior to admission.    Assessment: S/p PEG tube placement per IR procedure this today 4/5 AM.  No anticoagulation or antiplatelet  PTA.   No antiplatelet inpatient.  On VTE prophylaxis Lovenox 40 mg q24hr inpatient which was held this AM.  Dr. Pascal Lux (IR) noted IR procedure with standard bleeding risk. May resume prophylaxis lovenox on +1 day  (tomorrow AM).   No bleeding reported.    Plan:  Hold VTE prophylaxis Lovenox today.  May resume Lovenox 40 mg SQ q24 hours tomorrow AM.   Nicole Cella, RPh Clinical Pharmacist 04/24/2020,11:18 AM

## 2020-04-24 NOTE — Plan of Care (Signed)
  Problem: Activity: Goal: Ability to tolerate increased activity will improve Outcome: Progressing   Problem: Clinical Measurements: Goal: Ability to maintain a body temperature in the normal range will improve Outcome: Progressing   Problem: Respiratory: Goal: Ability to maintain adequate ventilation will improve Outcome: Progressing Goal: Ability to maintain a clear airway will improve Outcome: Progressing   Problem: Education: Goal: Knowledge of General Education information will improve Description: Including pain rating scale, medication(s)/side effects and non-pharmacologic comfort measures Outcome: Progressing   Problem: Health Behavior/Discharge Planning: Goal: Ability to manage health-related needs will improve Outcome: Progressing   Problem: Clinical Measurements: Goal: Ability to maintain clinical measurements within normal limits will improve Outcome: Progressing Goal: Will remain free from infection Outcome: Progressing Goal: Diagnostic test results will improve Outcome: Progressing Goal: Respiratory complications will improve Outcome: Progressing Goal: Cardiovascular complication will be avoided Outcome: Progressing   Problem: Activity: Goal: Risk for activity intolerance will decrease Outcome: Progressing   Problem: Nutrition: Goal: Adequate nutrition will be maintained Outcome: Progressing   Problem: Coping: Goal: Level of anxiety will decrease Outcome: Progressing   Problem: Elimination: Goal: Will not experience complications related to bowel motility Outcome: Progressing Goal: Will not experience complications related to urinary retention Outcome: Progressing   Problem: Pain Managment: Goal: General experience of comfort will improve Outcome: Progressing   Problem: Safety: Goal: Ability to remain free from injury will improve Outcome: Progressing   Problem: Skin Integrity: Goal: Risk for impaired skin integrity will decrease Outcome:  Progressing   Problem: Nutrition Goal: Patient maintains adequate hydration Outcome: Progressing Goal: Patient maintains weight Outcome: Progressing Goal: Patient/Family demonstrates understanding of diet Outcome: Progressing Goal: Patient/Family independently completes tube feeding Outcome: Progressing Goal: Patient will have no more than 5 lb weight change during LOS Outcome: Progressing Goal: Patient will utilize adaptive techniques to administer nutrition Outcome: Progressing Goal: Patient will verbalize dietary restrictions Outcome: Progressing

## 2020-04-24 NOTE — Procedures (Signed)
Pre procedure Dx: Dysphagia Post Procedure Dx: Same  Successful fluoroscopic guided insertion of gastrostomy tube.   The gastrostomy tube may be used immediately for medications.   Tube feeds may be initiated in 24 hours as per the primary team.    EBL: None Complications: None immediate  Jay Dystany Duffy, MD Pager #: [phone]    

## 2020-04-24 NOTE — Progress Notes (Signed)
Patient ID: Author Hatlestad, male   DOB: May 23, 1965, 55 y.o.   MRN: [id number]  PROGRESS NOTE    Mario Peters  CBS:[id number] DOB: 1965/05/07 DOA: 04/17/2020 PCP: Patient, [name]   Brief Narrative:  55 year old male with history of alcohol and tobacco abuse presented with weakness, ongoing dysphagia and very poor oral intake for 4 months along with weight loss, hoarseness, cough and congestion.  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  ENT was consulted.  He underwent tracheostomy on 04/22/2020 along with direct laryngoscopy with biopsy.  He was briefly transferred to ICU but has been subsequently transferred out of ICU.  Assessment & Plan:   Severe sepsis: Present on admission Aspiration pneumonia Leukocytosis: Resolved -Sepsis has resolved.  Cultures negative so far. -Status post treatment with cefepime for 7 days.  Also received Zithromax for 5 days.  Laryngeal mass partially obstructing airway Acute respiratory failure with hypoxia Severe dysphagia -Status post tracheostomy, direct laryngoscopy and biopsy and NG tube placement -Was transferred briefly to ICU but has been subsequently transferred out of ICU -Still requiring 10 L oxygen via tracheostomy.  Wean off as able -Follow further ENT recommendations regarding trach care and follow pathology -SLP following -IR is planning for G-tube placement today.  Acute kidney injury -Resolved with hydration  Tobacco abuse -Counseled by prior hospitalist  Alcohol abuse Alcoholic liver disease -Ultrasound was negative for cirrhosis of liver  Hypoalbuminemia Severe protein calorie malnutrition -Follow nutrition recommendations  Hyponatremia -Improved  Hypomagnesemia -Replace.  Repeat a.m. labs  Hypophosphatemia -Possibly likely from refeeding syndrome.  Replace  DVT  prophylaxis: Lovenox Code Status: Full Family Communication: None at bedside Disposition Plan: Status is: Inpatient  Remains inpatient appropriate because:Inpatient level of care appropriate due to severity of illness   Dispo: The patient is from: Home              Anticipated d/c is to: Home              Patient currently is not medically stable to d/c.   Difficult to place patient No   Consultants: ENT/IR  Procedures: tracheostomy, direct laryngoscopy and biopsy   Antimicrobials:  Anti-infectives (From admission, onward)   Start     Dose/Rate Route Frequency Ordered Stop   04/24/20 1011  ceFAZolin (ANCEF) 2-4 GM/100ML-% IVPB       Note to Pharmacy: Barrett Henle   : cabinet override      04/24/20 1011 04/24/20 2214   04/18/20 1800  ceFEPIme (MAXIPIME) 2 g in sodium chloride 0.9 % 100 mL IVPB        2 g 200 mL/hr over 30 Minutes Intravenous Every 8 hours 04/18/20 1029 04/23/20 2359   04/18/20 1100  vancomycin (VANCOREADY) IVPB 750 mg/150 mL  Status:  Discontinued        750 mg 150 mL/hr over 60 Minutes Intravenous Every 12 hours 04/18/20 1029 04/18/20 1039   04/18/20 1000  ceFEPIme (MAXIPIME) 2 g in sodium chloride 0.9 % 100 mL IVPB  Status:  Discontinued        2 g 200 mL/hr over 30 Minutes Intravenous Every 12 hours 04/17/20 2153 04/18/20 1029   04/17/20 2300  azithromycin (ZITHROMAX) 500 mg in sodium chloride 0.9 % 250 mL IVPB  Status:  Discontinued        500 mg 250 mL/hr over 60 Minutes Intravenous Every 24 hours 04/17/20  2253 04/22/20 0950   04/17/20 2153  vancomycin variable dose per unstable renal function (pharmacist dosing)  Status:  Discontinued         Does not apply See admin instructions 04/17/20 2153 04/18/20 1039   04/17/20 1930  vancomycin (VANCOREADY) IVPB 1000 mg/200 mL        1,000 mg 200 mL/hr over 60 Minutes Intravenous  Once 04/17/20 1900 04/17/20 2323   04/17/20 1915  ceFEPIme (MAXIPIME) 2 g in sodium chloride 0.9 % 100 mL IVPB        2 g 200  mL/hr over 30 Minutes Intravenous  Once 04/17/20 1900 04/17/20 2101   04/17/20 1915  metroNIDAZOLE (FLAGYL) IVPB 500 mg        500 mg 100 mL/hr over 60 Minutes Intravenous  Once 04/17/20 1900 04/17/20 2209        Subjective: Patient seen and examined at bedside.  Nods his head to some questions.  Poor historian.  No overnight fever or vomiting reported.  Objective: Vitals:   04/24/20 0045 04/24/20 0430 04/24/20 0815 04/24/20 0835  BP: 109/64 (!) 128/56 (!) 123/99   Pulse: 64 60 65 66  Resp: 16 16 19 17   Temp:  98.4 F (36.9 C) 98.9 F (37.2 C)   TempSrc:  Oral Oral   SpO2: 96% 98% 97% 100%  Weight:      Height:        Intake/Output Summary (Last 24 hours) at 04/24/2020 1015 Last data filed at 04/24/2020 0614 Gross per 24 hour  Intake 845.15 ml  Output 650 ml  Net 195.15 ml   Filed Weights   04/17/20 2132 04/19/20 0736  Weight: 65.8 kg 51.6 kg    Examination:  General exam: Looks chronically ill and deconditioned.  No distress ENT: Lurline Idol present and currently on 10 L oxygen by the same Respiratory system: Bilateral decreased breath sounds at bases with scattered crackles Cardiovascular system: S1 & S2 heard, Rate controlled Gastrointestinal system: Abdomen is nondistended, soft and nontender. Normal bowel sounds heard. Extremities: No cyanosis, clubbing, edema  Central nervous system: Awake, nods his head to some questions.  Poor historian no focal neurological deficits. Moving extremities Skin: No rashes, lesions or ulcers Psychiatry: Flat affect   Data Reviewed: I have personally reviewed following labs and imaging studies  CBC: Recent Labs  Lab 04/17/20 1936 04/18/20 0010 04/21/20 0025 04/22/20 0409 04/22/20 1144 04/22/20 1539 04/23/20 0120 04/24/20 0305  WBC 17.5*   < > 12.2* 13.4*  --  13.2* 8.7 9.3  NEUTROABS 15.8*  --   --   --   --  12.3*  --   --   HGB 16.5   < > 12.7* 12.8* 13.9 12.5* 12.6* 11.4*  HCT 46.5   < > 37.9* 38.9* 41.0 37.9* 37.9*  34.7*  MCV 86.3   < > 91.1 91.3  --  91.3 92.2 93.5  PLT 244   < > 181 217  --  219 210 237   < > = values in this interval not displayed.   Basic Metabolic Panel: Recent Labs  Lab 04/21/20 0025 04/21/20 0806 04/22/20 0409 04/22/20 1144 04/22/20 1539 04/23/20 0120 04/24/20 0305  NA 132* 132* 133* 132* 134* 133* 135  K 3.4* 3.5 2.9* 3.5 3.4* 4.1 3.9  CL 89* 86* 86* 84* 87* 89* 97*  CO2 37* 35* 37*  --  37* 32 35*  GLUCOSE 76 66* 68* 72 78 112* 123*  BUN <5* <5* 6 6 6  11  12  CREATININE 0.47* 0.57* 0.50* 0.40* 0.53* 0.56* 0.38*  CALCIUM 7.8* 8.0* 8.0*  --  8.1* 7.9* 7.6*  MG 1.4*  --  1.2*  --  1.1* 1.5* 1.6*  PHOS 2.5  --  2.9  --  2.2* 2.2* <1.0*   GFR: Estimated Creatinine Clearance: 77 mL/min (A) (by C-G formula based on SCr of 0.38 mg/dL (L)). Liver Function Tests: Recent Labs  Lab 04/18/20 0700 04/19/20 0139 04/20/20 0110 04/21/20 0025 04/22/20 1539  AST 31 37 35 25 20  ALT 20 19 22 19 17   ALKPHOS 63 76 56 56 60  BILITOT 1.3* 1.0 0.5 0.8 1.1  PROT 5.2* 4.9* 4.9* 5.2* 5.4*  ALBUMIN 1.7* 1.6* 1.7* 1.8* 1.8*   No results for input(s): LIPASE, AMYLASE in the last 168 hours. Recent Labs  Lab 04/17/20 2357  AMMONIA 25   Coagulation Profile: Recent Labs  Lab 04/17/20 1936  INR 1.4*   Cardiac Enzymes: Recent Labs  Lab 04/18/20 0700  CKTOTAL 117   BNP (last 3 results) No results for input(s): PROBNP in the last 8760 hours. HbA1C: No results for input(s): HGBA1C in the last 72 hours. CBG: Recent Labs  Lab 04/21/20 0344 04/23/20 1631 04/24/20 0746  GLUCAP 81 134* 97   Lipid Profile: No results for input(s): CHOL, HDL, LDLCALC, TRIG, CHOLHDL, LDLDIRECT in the last 72 hours. Thyroid Function Tests: Recent Labs    04/22/20 1539  TSH 0.602   Anemia Panel: No results for input(s): VITAMINB12, FOLATE, FERRITIN, TIBC, IRON, RETICCTPCT in the last 72 hours. Sepsis Labs: Recent Labs  Lab 04/17/20 1936 04/17/20 2206 04/18/20 0100 04/18/20 0300   LATICACIDVEN 5.0* 3.4* 1.3 1.6    Recent Results (from the past 240 hour(s))  Blood Culture (routine x 2)     Status: None   Collection Time: 04/17/20  7:35 PM   Specimen: BLOOD  Result Value Ref Range Status   Specimen Description BLOOD RIGHT ANTECUBITAL  Final   Special Requests   Final    BOTTLES DRAWN AEROBIC AND ANAEROBIC Blood Culture adequate volume   Culture   Final    NO GROWTH 6 DAYS Performed at [facility], [address] [place], [place] [address]    Report Status 04/23/2020 FINAL  Final  Resp Panel by RT-PCR (Flu A&B, Covid) Nasopharyngeal Swab     Status: None   Collection Time: 04/17/20  9:33 PM   Specimen: Nasopharyngeal Swab; Nasopharyngeal(NP) swabs in vial transport medium  Result Value Ref Range Status   SARS Coronavirus 2 by RT PCR NEGATIVE NEGATIVE Final    Comment: (NOTE) SARS-CoV-2 target nucleic acids are NOT DETECTED.  The SARS-CoV-2 RNA is generally detectable in upper respiratory specimens during the acute phase of infection. The lowest concentration of SARS-CoV-2 viral copies this assay can detect is 138 copies/mL. A negative result does not preclude SARS-Cov-2 infection and should not be used as the sole basis for treatment or other patient management decisions. A negative result may occur with  improper specimen collection/handling, submission of specimen other than nasopharyngeal swab, presence of viral mutation(s) within the areas targeted by this assay, and inadequate number of viral copies(<138 copies/mL). A negative result must be combined with clinical observations, patient history, and epidemiological information. The expected result is Negative.  Fact Sheet for Patients:  EntrepreneurPulse.com.au  Fact Sheet for Healthcare Providers:  IncredibleEmployment.be  This test is no t yet approved or cleared by the [place] and  has been authorized for  detection and/or diagnosis of  SARS-CoV-2 by FDA under an Emergency Use Authorization (EUA). This EUA will remain  in effect (meaning this test can be used) for the duration of the COVID-19 declaration under Section 564(b)(1) of the Act, 21 U.S.C.section 360bbb-3(b)(1), unless the authorization is terminated  or revoked sooner.       Influenza A by PCR NEGATIVE NEGATIVE Final   Influenza B by PCR NEGATIVE NEGATIVE Final    Comment: (NOTE) The Xpert Xpress SARS-CoV-2/FLU/RSV plus assay is intended as an aid in the diagnosis of influenza from Nasopharyngeal swab specimens and should not be used as a sole basis for treatment. Nasal washings and aspirates are unacceptable for Xpert Xpress SARS-CoV-2/FLU/RSV testing.  Fact Sheet for Patients: EntrepreneurPulse.com.au  Fact Sheet for Healthcare Providers: IncredibleEmployment.be  This test is not yet approved or cleared by the [place] FDA and has been authorized for detection and/or diagnosis of SARS-CoV-2 by FDA under an Emergency Use Authorization (EUA). This EUA will remain in effect (meaning this test can be used) for the duration of the COVID-19 declaration under Section 564(b)(1) of the Act, 21 U.S.C. section 360bbb-3(b)(1), unless the authorization is terminated or revoked.  Performed at [facility], [address], [place], [place] [address]   Blood Culture (routine x 2)     Status: None   Collection Time: 04/17/20 10:07 PM   Specimen: BLOOD RIGHT FOREARM  Result Value Ref Range Status   Specimen Description BLOOD RIGHT FOREARM  Final   Special Requests   Final    BOTTLES DRAWN AEROBIC AND ANAEROBIC Blood Culture adequate volume   Culture   Final    NO GROWTH 6 DAYS Performed at [facility], [place] [address] [place], [place] [address]    Report Status 04/23/2020 FINAL  Final  MRSA PCR Screening     Status: None   Collection Time: 04/23/20  4:15 PM   Specimen: Nasopharyngeal  Result Value Ref  Range Status   MRSA by PCR NEGATIVE NEGATIVE Final    Comment:        The GeneXpert MRSA Assay (FDA approved for NASAL specimens only), is one component of a comprehensive MRSA colonization surveillance program. It is not intended to diagnose MRSA infection nor to guide or monitor treatment for MRSA infections. Performed at [facility], [place] [address] [place], [place] [address]          Radiology Studies: CT ABDOMEN WO CONTRAST  Result Date: 04/23/2020 CLINICAL DATA:  Assess anatomy for gastrostomy tube placement EXAM: CT ABDOMEN WITHOUT CONTRAST TECHNIQUE: Multidetector CT imaging of the abdomen was performed following the standard protocol without IV contrast. COMPARISON:  CT chest 04/18/2020 FINDINGS: Lower chest: Trace bilateral pleural effusions. Bilateral lower lobe atelectasis with superimposed peripheral consolidation in a peribronchovascular distribution. In the setting of diffuse lower lobe bronchial wall thickening, findings are concerning for aspiration. More mild peribronchovascular ground-glass attenuation opacities in the inferior aspect of the middle lobe and lingula. The visualized cardiac structures are normal in size. No pericardial effusion. Hepatobiliary: Normal hepatic contour and morphology. No discrete hepatic lesion. High density material present within the gallbladder lumen likely representing sludge and/or small stones. No biliary ductal dilatation. Pancreas: Unremarkable. No pancreatic ductal dilatation or surrounding inflammatory changes. Spleen: Normal in size without focal abnormality. Peripheral linear calcification along the splenic margin. Adrenals/Urinary Tract: Unremarkable adrenal glands and kidneys. No hydronephrosis or nephrolithiasis. Stomach/Bowel: Gastric tube is present. The tip of the gastric tube is in  the proximal duodenum. Normal gastric anatomy. There is mild colonic interposition at the splenic flexure. Excellent visualization of the  colon on the scout radiograph. Vascular/Lymphatic: Limited evaluation in the absence of intravenous contrast. Aortic atherosclerosis is present. No aneurysm. No suspicious lymphadenopathy. Other: No abdominal wall hernia or abnormality. Musculoskeletal: No acute fracture or aggressive appearing lytic or blastic osseous lesion. IMPRESSION: 1. Anatomy is suitable for percutaneous gastrostomy tube placement. 2. Probable aspiration changes in the bilateral lower lobes, and to a lesser extent in the inferior middle lobe and lingula. 3. Trace bilateral pleural effusions. 4. The tip of the gastric tube is in the proximal duodenum. 5. Sludge and/or small stones in the gallbladder. 6.  Aortic Atherosclerosis (ICD10-170.0). Electronically Signed   By: Jacqulynn Cadet M.D.   On: 04/23/2020 07:05   DG Chest Port 1 View  Result Date: 04/22/2020 CLINICAL DATA:  54 year old male status post tracheostomy placement. EXAM: PORTABLE CHEST 1 VIEW COMPARISON:  Chest radiograph dated 04/17/2020 and CT dated 09/17/2020 FINDINGS: Tracheostomy with tip approximately 4.5 cm above the carina. Enteric tube extends below the diaphragm with tip beyond the margin of the image. Bilateral pulmonary nodularity primarily involving the lower lung fields in keeping with known pneumonia. No significant pleural effusion. No pneumothorax. No acute osseous pathology. Old healed fracture deformity of the left humeral neck. IMPRESSION: Tracheostomy above the carina. Electronically Signed   By: Anner Crete M.D.   On: 04/22/2020 17:47        Scheduled Meds: . chlorhexidine  15 mL Mouth Rinse BID  . Chlorhexidine Gluconate Cloth  6 each Topical Daily  . enoxaparin (LOVENOX) injection  40 mg Subcutaneous Daily  . feeding supplement (PROSource TF)  45 mL Per Tube BID  . fentaNYL      . glucagon (human recombinant)      . mouth rinse  15 mL Mouth Rinse q12n4p  . midazolam      . nicotine  21 mg Transdermal Daily   Continuous  Infusions: . 0.9 % NaCl with KCl 40 mEq / L 50 mL/hr at 04/23/20 1700  . ceFAZolin    . feeding supplement (JEVITY 1.2 CAL) 1,000 mL (04/23/20 1346)  . potassium PHOSPHATE IVPB (in mmol) 30 mmol (04/24/20 8828)          Aline August, MD [facility] 04/24/2020, 10:15 AM

## 2020-04-25 ENCOUNTER — Inpatient Hospital Stay (HOSPITAL_COMMUNITY): Payer: Medicaid Other

## 2020-04-25 DIAGNOSIS — E871 Hypo-osmolality and hyponatremia: Secondary | ICD-10-CM | POA: Diagnosis not present

## 2020-04-25 DIAGNOSIS — N179 Acute kidney failure, unspecified: Secondary | ICD-10-CM | POA: Diagnosis not present

## 2020-04-25 LAB — CBC WITH DIFFERENTIAL/PLATELET
Abs Immature Granulocytes: 0.09 10*3/uL — ABNORMAL HIGH (ref 0.00–0.07)
Basophils Absolute: 0 10*3/uL (ref 0.0–0.1)
Basophils Relative: 0 %
Eosinophils Absolute: 0.1 10*3/uL (ref 0.0–0.5)
Eosinophils Relative: 1 %
HCT: 34.9 % — ABNORMAL LOW (ref 39.0–52.0)
Hemoglobin: 11.5 g/dL — ABNORMAL LOW (ref 13.0–17.0)
Immature Granulocytes: 1 %
Lymphocytes Relative: 17 %
Lymphs Abs: 1.3 10*3/uL (ref 0.7–4.0)
MCH: 30.7 pg (ref 26.0–34.0)
MCHC: 33 g/dL (ref 30.0–36.0)
MCV: 93.3 fL (ref 80.0–100.0)
Monocytes Absolute: 0.6 10*3/uL (ref 0.1–1.0)
Monocytes Relative: 7 %
Neutro Abs: 5.9 10*3/uL (ref 1.7–7.7)
Neutrophils Relative %: 74 %
Platelets: 234 10*3/uL (ref 150–400)
RBC: 3.74 MIL/uL — ABNORMAL LOW (ref 4.22–5.81)
RDW: 12 % (ref 11.5–15.5)
WBC: 8 10*3/uL (ref 4.0–10.5)
nRBC: 0 % (ref 0.0–0.2)

## 2020-04-25 LAB — GLUCOSE, CAPILLARY
Glucose-Capillary: 81 mg/dL (ref 70–99)
Glucose-Capillary: 79 mg/dL (ref 70–99)
Glucose-Capillary: 79 mg/dL (ref 70–99)
Glucose-Capillary: 92 mg/dL (ref 70–99)
Glucose-Capillary: 101 mg/dL — ABNORMAL HIGH (ref 70–99)
Glucose-Capillary: 128 mg/dL — ABNORMAL HIGH (ref 70–99)

## 2020-04-25 LAB — MAGNESIUM: Magnesium: 1.4 mg/dL — ABNORMAL LOW (ref 1.7–2.4)

## 2020-04-25 LAB — PHOSPHORUS: Phosphorus: 3 mg/dL (ref 2.5–4.6)

## 2020-04-25 MED ORDER — FUROSEMIDE 10 MG/ML IJ SOLN
60.0000 mg | Freq: Once | INTRAMUSCULAR | Status: AC
  Administered 2020-04-25: 60 mg via INTRAVENOUS
  Filled 2020-04-25: qty 6

## 2020-04-25 MED ORDER — MAGNESIUM SULFATE 2 GM/50ML IV SOLN
2.0000 g | Freq: Once | INTRAVENOUS | Status: AC
  Administered 2020-04-25: 2 g via INTRAVENOUS
  Filled 2020-04-25: qty 50

## 2020-04-25 NOTE — Progress Notes (Signed)
Patient ID: Mario Peters, male   DOB: Jun 03, 1965, 55 y.o.   MRN: [id number] Pt s/p perc G tube placement yesterday; pt has some soreness at site as expected; afebrile; WBC nl; hgb stable; G tube intact, insertion site ok, mildly tender, no leaking; ok to use tube as needed; keep outer disc close to skin surface to prevent leakage

## 2020-04-25 NOTE — Progress Notes (Signed)
Physical Therapy Treatment Patient Details Name: Mario Peters MRN: [id number] DOB: Mar 17, 1965 Today's Date: 04/25/2020    History of Present Illness Pt is an 55 y.o. male. Admitted 04/22/20 with difficulty with swallowing and weight loss (increased over the past 4 months) dehydration and hypotension as well as infection (pneumonia). 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  Tracheostomy tube (04/22/2020); Direct laryngoscopy (04/22/2020); and esophagoscopy (04/22/2020). PMH includes: Long history of smoking and ETOH, seizures, low health care literacy.    PT Comments    Patient initially reluctant but then agreed to ambulation. Appeared fatigued and did not want to get OOB. Agreed to walk and return to bed. On FiO2 45% pt maintained saturations >92% (92-97%) with walking x 64 ft. Steady but with decr velocity (appropriately for his respiratory status).     Follow Up Recommendations  Supervision for mobility/OOB;No PT follow up     Equipment Recommendations  None recommended by PT    Recommendations for Other Services       Precautions / Restrictions Precautions Precautions: Fall Precaution Comments: trach Restrictions Weight Bearing Restrictions: No    Mobility  Bed Mobility Overal bed mobility: Modified Independent                  Transfers Overall transfer level: Needs assistance Equipment used: None Transfers: Sit to/from Stand Sit to Stand: Supervision         General transfer comment: supervision for lines and safety  Ambulation/Gait Ambulation/Gait assistance: Min guard Gait Distance (Feet): 64 Feet Assistive device: None Gait Pattern/deviations: Step-through pattern;Wide base of support Gait velocity: slow and appropriate for situation   General Gait Details: shortened steps with slower pace (and wider BOS0   Stairs             Wheelchair Mobility    Modified Rankin (Stroke Patients Only)       Balance Overall balance  assessment: Needs assistance Sitting-balance support: No upper extremity supported;Feet supported Sitting balance-[name]: Good     Standing balance support: No upper extremity supported;During functional activity Standing balance-[name]: Fair                              Cognition Arousal/Alertness: Awake/alert Behavior During Therapy: WFL for tasks assessed/performed Overall Cognitive Status: Within Functional Limits for tasks assessed                                 General Comments: did a good job using body language to communicate his needs and wants      Exercises      General Comments General comments (skin integrity, edema, etc.): on 45% fiO2 with sats >92% throughout ambulation      Pertinent Vitals/Pain Pain Assessment: Faces Pain Score: 3  Faces Pain Scale: No hurt Pain Location: 0 Pain Descriptors / Indicators: Aching Pain Intervention(s): Patient requesting pain meds-RN notified    Home Living                      Prior Function            PT Goals (current goals can now be found in the care plan section) Acute Rehab PT Goals Patient Stated Goal: go home Time For Goal Achievement: 05/07/20 Potential to Achieve Goals: Good Progress towards PT goals: Progressing toward goals    Frequency    Min  3X/week      PT Plan Discharge plan needs to be updated    Co-evaluation              AM-PAC PT "6 Clicks" Mobility   Outcome Measure  Help needed turning from your back to your side while in a flat bed without using bedrails?: None Help needed moving from lying on your back to sitting on the side of a flat bed without using bedrails?: None Help needed moving to and from a bed to a chair (including a wheelchair)?: A Little Help needed standing up from a chair using your arms (e.g., wheelchair or bedside chair)?: A Little Help needed to walk in hospital room?: A Little Help needed climbing 3-5 steps  with a railing? : A Little 6 Click Score: 20    End of Session Equipment Utilized During Treatment: Gait belt;Oxygen Activity Tolerance: Patient limited by fatigue Patient left: with call bell/phone within reach;with family/visitor present;in bed Nurse Communication: Mobility status PT Visit Diagnosis: Difficulty in walking, not elsewhere classified (R26.2)     Time: 2197-5883 PT Time Calculation (min) (ACUTE ONLY): 22 min  Charges:  $Gait Training: 8-22 mins                      Arby Barrette, PT Pager [phone]    [name] 04/25/2020, 2:37 PM

## 2020-04-25 NOTE — Progress Notes (Signed)
Patient ID: Mario Peters, male   DOB: 1965-05-20, 55 y.o.   MRN: [id number]  PROGRESS NOTE    Mario Peters  DQQ:[id number] DOB: 1965/01/30 DOA: 04/17/2020 PCP: Patient, [name]   Brief Narrative:  55 year old male with history of alcohol and tobacco abuse presented with weakness, ongoing dysphagia and very poor oral intake for 4 months along with weight loss, hoarseness, cough and congestion.  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  ENT was consulted.  He underwent tracheostomy on 04/22/2020 along with direct laryngoscopy with biopsy.  He was briefly transferred to ICU but has been subsequently transferred out of ICU.  Assessment & Plan:   Severe sepsis: Present on admission Aspiration pneumonia Leukocytosis: Resolved -Sepsis has resolved.  Cultures negative so far. -Status post treatment with cefepime for 7 days.  Also received Zithromax for 5 days.  Laryngeal mass partially obstructing airway Acute respiratory failure with hypoxia Severe dysphagia -Status post tracheostomy, direct laryngoscopy and biopsy and NG tube placement -Was transferred briefly to ICU but has been subsequently transferred out of ICU -Still on 10 L oxygen via tracheostomy.  Wean off as able -Follow further ENT recommendations regarding trach care and follow pathology -SLP following -Status post G-tube placement by IR on 04/24/2020.  Will hopefully start tube feedings later today: Dietitian following.  Acute kidney injury -Resolved with hydration  Tobacco abuse -Counseled by prior hospitalist  Alcohol abuse Alcoholic liver disease -Ultrasound was negative for cirrhosis of liver  Hypoalbuminemia Severe protein calorie malnutrition -Follow nutrition recommendations  Hyponatremia -Mild.  Monitor  Hypomagnesemia -Replace.  Repeat a.m.  labs  Hypophosphatemia -Possibly likely from refeeding syndrome.  Improved  DVT prophylaxis: Lovenox Code Status: Full Family Communication: None at bedside Disposition Plan: Status is: Inpatient  Remains inpatient appropriate because:Inpatient level of care appropriate due to severity of illness   Dispo: The patient is from: Home              Anticipated d/c is to: Home              Patient currently is not medically stable to d/c.   Difficult to place patient No   Consultants: ENT/IR  Procedures: tracheostomy, direct laryngoscopy and biopsy   Antimicrobials:  Anti-infectives (From admission, onward)   Start     Dose/Rate Route Frequency Ordered Stop   04/24/20 1800  ceFAZolin (ANCEF) IVPB 2g/100 mL premix        2 g 200 mL/hr over 30 Minutes Intravenous  Once 04/24/20 1354 04/24/20 1657   04/24/20 1120  ceFAZolin (ANCEF) IVPB 2g/100 mL premix        2 g 200 mL/hr over 30 Minutes Intravenous  Once 04/24/20 1052 04/24/20 1050   04/18/20 1800  ceFEPIme (MAXIPIME) 2 g in sodium chloride 0.9 % 100 mL IVPB        2 g 200 mL/hr over 30 Minutes Intravenous Every 8 hours 04/18/20 1029 04/23/20 2359   04/18/20 1100  vancomycin (VANCOREADY) IVPB 750 mg/150 mL  Status:  Discontinued        750 mg 150 mL/hr over 60 Minutes Intravenous Every 12 hours 04/18/20 1029 04/18/20 1039   04/18/20 1000  ceFEPIme (MAXIPIME) 2 g in sodium chloride 0.9 % 100 mL IVPB  Status:  Discontinued        2 g 200 mL/hr over 30 Minutes Intravenous Every 12 hours 04/17/20 2153  04/18/20 1029   04/17/20 2300  azithromycin (ZITHROMAX) 500 mg in sodium chloride 0.9 % 250 mL IVPB  Status:  Discontinued        500 mg 250 mL/hr over 60 Minutes Intravenous Every 24 hours 04/17/20 2253 04/22/20 0950   04/17/20 2153  vancomycin variable dose per unstable renal function (pharmacist dosing)  Status:  Discontinued         Does not apply See admin instructions 04/17/20 2153 04/18/20 1039   04/17/20 1930  vancomycin  (VANCOREADY) IVPB 1000 mg/200 mL        1,000 mg 200 mL/hr over 60 Minutes Intravenous  Once 04/17/20 1900 04/17/20 2323   04/17/20 1915  ceFEPIme (MAXIPIME) 2 g in sodium chloride 0.9 % 100 mL IVPB        2 g 200 mL/hr over 30 Minutes Intravenous  Once 04/17/20 1900 04/17/20 2101   04/17/20 1915  metroNIDAZOLE (FLAGYL) IVPB 500 mg        500 mg 100 mL/hr over 60 Minutes Intravenous  Once 04/17/20 1900 04/17/20 2209        Subjective: Patient seen and examined at bedside.  Nods his head to some questions.  Poor historian.  No worsening shortness breath, overnight fever reported.  Complains of intermittent abdominal pain. Objective: Vitals:   04/24/20 1900 04/24/20 2021 04/24/20 2312 04/25/20 0404  BP: 131/76  137/71 (!) 156/89  Pulse: 63  60 74  Resp: 20  20 20   Temp: 98.6 F (37 C)  98.4 F (36.9 C) 98.2 F (36.8 C)  TempSrc: Oral  Oral Oral  SpO2: 100% 99% 100% 100%  Weight:      Height:        Intake/Output Summary (Last 24 hours) at 04/25/2020 0735 Last data filed at 04/25/2020 0500 Gross per 24 hour  Intake 0 ml  Output 1100 ml  Net -1100 ml   Filed Weights   04/17/20 2132 04/19/20 0736  Weight: 65.8 kg 51.6 kg    Examination:  General exam: No acute distress.  Chronically ill and deconditioned ENT: On 10 L oxygen via trach Respiratory system: Decreased bilateral breath sounds at bases with crackles Cardiovascular system: Rate controlled, S1-S2 heard  gastrointestinal system: Abdomen is slightly distended, soft and nontender.  Bowel sounds are heard  extremities: Trace lower extremity edema; no clubbing Central nervous system: Notices it to some questions; awake.  Poor historian; no focal neurological deficits.  Moves extremities Skin: No obvious ecchymosis/lesions Psychiatry: Affect is flat  Data Reviewed: I have personally reviewed following labs and imaging studies  CBC: Recent Labs  Lab 04/22/20 0409 04/22/20 1144 04/22/20 1539 04/23/20 0120  04/24/20 0305 04/25/20 0200  WBC 13.4*  --  13.2* 8.7 9.3 8.0  NEUTROABS  --   --  12.3*  --   --  5.9  HGB 12.8* 13.9 12.5* 12.6* 11.4* 11.5*  HCT 38.9* 41.0 37.9* 37.9* 34.7* 34.9*  MCV 91.3  --  91.3 92.2 93.5 93.3  PLT 217  --  219 210 237 401   Basic Metabolic Panel: Recent Labs  Lab 04/22/20 0409 04/22/20 1144 04/22/20 1539 04/23/20 0120 04/24/20 0305 04/24/20 1751 04/24/20 1809 04/25/20 0200  NA 133* 132* 134* 133* 135 134*  --   --   K 2.9* 3.5 3.4* 4.1 3.9 4.3  --   --   CL 86* 84* 87* 89* 97* 97*  --   --   CO2 37*  --  37* 32 35* 33*  --   --  GLUCOSE 68* 72 78 112* 123* 102*  --   --   BUN 6 6 6 11 12 7   --   --   CREATININE 0.50* 0.40* 0.53* 0.56* 0.38* 0.39*  --   --   CALCIUM 8.0*  --  8.1* 7.9* 7.6* 7.7*  --   --   MG 1.2*  --  1.1* 1.5* 1.6*  --   --  1.4*  PHOS 2.9  --  2.2* 2.2* <1.0*  --  3.0 3.0   GFR: Estimated Creatinine Clearance: 77 mL/min (A) (by C-G formula based on SCr of 0.39 mg/dL (L)). Liver Function Tests: Recent Labs  Lab 04/19/20 0139 04/20/20 0110 04/21/20 0025 04/22/20 1539  AST 37 35 25 20  ALT 19 22 19 17   ALKPHOS 76 56 56 60  BILITOT 1.0 0.5 0.8 1.1  PROT 4.9* 4.9* 5.2* 5.4*  ALBUMIN 1.6* 1.7* 1.8* 1.8*   No results for input(s): LIPASE, AMYLASE in the last 168 hours. No results for input(s): AMMONIA in the last 168 hours. Coagulation Profile: No results for input(s): INR, PROTIME in the last 168 hours. Cardiac Enzymes: No results for input(s): CKTOTAL, CKMB, CKMBINDEX, TROPONINI in the last 168 hours. BNP (last 3 results) No results for input(s): PROBNP in the last 8760 hours. HbA1C: No results for input(s): HGBA1C in the last 72 hours. CBG: Recent Labs  Lab 04/24/20 1132 04/24/20 1558 04/24/20 1923 04/24/20 2314 04/25/20 0403  GLUCAP 132* 93 94 95 81   Lipid Profile: No results for input(s): CHOL, HDL, LDLCALC, TRIG, CHOLHDL, LDLDIRECT in the last 72 hours. Thyroid Function Tests: Recent Labs     04/22/20 1539  TSH 0.602   Anemia Panel: No results for input(s): VITAMINB12, FOLATE, FERRITIN, TIBC, IRON, RETICCTPCT in the last 72 hours. Sepsis Labs: No results for input(s): PROCALCITON, LATICACIDVEN in the last 168 hours.  Recent Results (from the past 240 hour(s))  Blood Culture (routine x 2)     Status: None   Collection Time: 04/17/20  7:35 PM   Specimen: BLOOD  Result Value Ref Range Status   Specimen Description BLOOD RIGHT ANTECUBITAL  Final   Special Requests   Final    BOTTLES DRAWN AEROBIC AND ANAEROBIC Blood Culture adequate volume   Culture   Final    NO GROWTH 6 DAYS Performed at [facility], [address], [place], [place] [address]    Report Status 04/23/2020 FINAL  Final  Resp Panel by RT-PCR (Flu A&B, Covid) Nasopharyngeal Swab     Status: None   Collection Time: 04/17/20  9:33 PM   Specimen: Nasopharyngeal Swab; Nasopharyngeal(NP) swabs in vial transport medium  Result Value Ref Range Status   SARS Coronavirus 2 by RT PCR NEGATIVE NEGATIVE Final    Comment: (NOTE) SARS-CoV-2 target nucleic acids are NOT DETECTED.  The SARS-CoV-2 RNA is generally detectable in upper respiratory specimens during the acute phase of infection. The lowest concentration of SARS-CoV-2 viral copies this assay can detect is 138 copies/mL. A negative result does not preclude SARS-Cov-2 infection and should not be used as the sole basis for treatment or other patient management decisions. A negative result may occur with  improper specimen collection/handling, submission of specimen other than nasopharyngeal swab, presence of viral mutation(s) within the areas targeted by this assay, and inadequate number of viral copies(<138 copies/mL). A negative result must be combined with clinical observations, patient history, and epidemiological information. The expected result is Negative.  Fact Sheet for Patients:  EntrepreneurPulse.com.au  Fact Sheet for  Healthcare Providers:  IncredibleEmployment.be  This test is no t yet approved or cleared by the [place] FDA and  has been authorized for detection and/or diagnosis of SARS-CoV-2 by FDA under an Emergency Use Authorization (EUA). This EUA will remain  in effect (meaning this test can be used) for the duration of the COVID-19 declaration under Section 564(b)(1) of the Act, 21 U.S.C.section 360bbb-3(b)(1), unless the authorization is terminated  or revoked sooner.       Influenza A by PCR NEGATIVE NEGATIVE Final   Influenza B by PCR NEGATIVE NEGATIVE Final    Comment: (NOTE) The Xpert Xpress SARS-CoV-2/FLU/RSV plus assay is intended as an aid in the diagnosis of influenza from Nasopharyngeal swab specimens and should not be used as a sole basis for treatment. Nasal washings and aspirates are unacceptable for Xpert Xpress SARS-CoV-2/FLU/RSV testing.  Fact Sheet for Patients: EntrepreneurPulse.com.au  Fact Sheet for Healthcare Providers: IncredibleEmployment.be  This test is not yet approved or cleared by the [place] FDA and has been authorized for detection and/or diagnosis of SARS-CoV-2 by FDA under an Emergency Use Authorization (EUA). This EUA will remain in effect (meaning this test can be used) for the duration of the COVID-19 declaration under Section 564(b)(1) of the Act, 21 U.S.C. section 360bbb-3(b)(1), unless the authorization is terminated or revoked.  Performed at [facility], [place] [address] [place], [place] [address]   Blood Culture (routine x 2)     Status: None   Collection Time: 04/17/20 10:07 PM   Specimen: BLOOD RIGHT FOREARM  Result Value Ref Range Status   Specimen Description BLOOD RIGHT FOREARM  Final   Special Requests   Final    BOTTLES DRAWN AEROBIC AND ANAEROBIC Blood Culture adequate volume   Culture   Final    NO GROWTH 6 DAYS Performed at [facility], [place]  [address], [place], [place] [address]    Report Status 04/23/2020 FINAL  Final  MRSA PCR Screening     Status: None   Collection Time: 04/23/20  4:15 PM   Specimen: Nasopharyngeal  Result Value Ref Range Status   MRSA by PCR NEGATIVE NEGATIVE Final    Comment:        The GeneXpert MRSA Assay (FDA approved for NASAL specimens only), is one component of a comprehensive MRSA colonization surveillance program. It is not intended to diagnose MRSA infection nor to guide or monitor treatment for MRSA infections. Performed at [facility], [place] [address], [place], [place] [address]          Radiology Studies: IR GASTROSTOMY TUBE MOD SED  Result Date: 04/24/2020 INDICATION: History of head neck cancer. Please perform percutaneous gastrostomy tube placement for enteric nutrition supplementation purposes. EXAM: PULL TROUGH GASTROSTOMY TUBE PLACEMENT COMPARISON:  CT abdomen and pelvis-04/22/2020 MEDICATIONS: Ancef 2 gm IV; Antibiotics were administered within 1 hour of the procedure. Glucagon 1 mg IV CONTRAST:  20 mL of Omnipaque 300 administered into the gastric lumen. ANESTHESIA/SEDATION: Moderate (conscious) sedation was employed during this procedure. A total of Versed 1 mg and Fentanyl 50 mcg was administered intravenously. Moderate Sedation Time: 35 minutes. The patient's level of consciousness and vital signs were monitored continuously by radiology nursing throughout the procedure under my direct supervision. FLUOROSCOPY TIME:  6 minutes (21 mGy) COMPLICATIONS: None immediate. PROCEDURE: Informed written consent was obtained from the patient following explanation of the procedure, risks, benefits and alternatives. A time out was performed prior  to the initiation of the procedure. Ultrasound scanning was performed to demarcate the edge of the left lobe of the liver. Maximal barrier sterile technique utilized including caps, mask, sterile gowns, sterile gloves, large sterile drape, hand hygiene  and Betadine prep. The left upper quadrant was sterilely prepped and draped. An oral gastric catheter was inserted into the stomach under fluoroscopy. The existing nasogastric feeding tube was removed. The left costal margin and air opacified transverse colon were identified and avoided. Air was injected into the stomach for insufflation and visualization under fluoroscopy. Under sterile conditions a 17 gauge trocar needle was utilized to access the stomach percutaneously beneath the left subcostal margin after the overlying soft tissues were anesthetized with 1% Lidocaine with epinephrine. Needle position was confirmed within the stomach with aspiration of air and injection of small amount of contrast. A single T tack was deployed for gastropexy. Over an Amplatz guide wire, a 9-French sheath was inserted into the stomach. A snare device was utilized to capture the oral gastric catheter. The snare device was pulled retrograde from the stomach up the esophagus and out the oropharynx. The 20-French pull-through gastrostomy was connected to the snare device and pulled antegrade through the oropharynx down the esophagus into the stomach and then through the percutaneous tract external to the patient. The gastrostomy was assembled externally. Contrast injection confirms position in the stomach. Several spot radiographic images were obtained in various obliquities for documentation. The patient tolerated procedure well without immediate post procedural complication. FINDINGS: After successful fluoroscopic guided placement, the gastrostomy tube is appropriately positioned with internal disc against the ventral aspect of the gastric lumen. IMPRESSION: Successful fluoroscopic insertion of a 20-French pull-through gastrostomy tube. The gastrostomy may be used immediately for medication administration and in 24 hrs for the initiation of feeds. Electronically Signed   By: Sandi Mariscal M.D.   On: 04/24/2020 12:17         Scheduled Meds: . chlorhexidine  15 mL Mouth Rinse BID  . Chlorhexidine Gluconate Cloth  6 each Topical Daily  . enoxaparin (LOVENOX) injection  40 mg Subcutaneous Daily  . feeding supplement (PROSource TF)  45 mL Per Tube BID  . mouth rinse  15 mL Mouth Rinse q12n4p  . nicotine  21 mg Transdermal Daily  . potassium & sodium phosphates  2 packet Per Tube TID WC & HS   Continuous Infusions: . 0.9 % NaCl with KCl 40 mEq / L 50 mL/hr at 04/24/20 2100  . feeding supplement (JEVITY 1.2 CAL) Stopped (04/24/20 0825)          Aline August, MD [facility] 04/25/2020, 7:35 AM

## 2020-04-25 NOTE — Progress Notes (Addendum)
Modified Barium Swallow Progress Note  Patient Details  Name: Mario Peters MRN: [id number] Date of Birth: 04/18/1965  Today's Date: 04/25/2020  Modified Barium Swallow completed.  Full report located under Chart Review in the Imaging Section.  Brief recommendations include the following:  Clinical Impression  Pt participated in brief MBS - PMV placed for one of only two boluses given due to the level of impairment and severe aspiration. He demonstrated a profound dysphagia with no ability to close the laryngeal vestibule.  There was marginal movement of the hyoid, no epiglottic inversion, no approximation of the arytenoids to epiglottic base, and no identifiable onset of the pharyngeal swallow.  One bolus each of nectar and thin liquid was observed to spill immediately into the larynx, eliciting a cough, and pt was able to move some of the bolus back into the oral cavity for suctioning. What remained of the bolus was immediately aspirated, a portion of which was ejected from the trach tube.  Mario Peters watched the video in real time - we discussed his swallowing deficits and his inability to eat safely or comfortably at this time.  SLP will follow for further education.  Continue NPO with PEG for nutrition.   Swallow Evaluation Recommendations       SLP Diet Recommendations: NPO                       Oral Care Recommendations: Oral care QID   Other Recommendations: Have oral suction available   Akyah Lagrange L. Tivis Ringer, [place] Office number [phone] Pager [phone]  Mario Peters 04/25/2020,11:49 AM

## 2020-04-25 NOTE — Progress Notes (Signed)
Spoke with speech therapy who state at this time following MBS they recommend strict NPO. Discussed recommendations with patient and his family who all verbalize understanding.

## 2020-04-25 NOTE — Progress Notes (Signed)
Occupational Therapy Treatment Patient Details Name: Mario Peters MRN: [id number] DOB: 12/30/1965 Today's Date: 04/25/2020    History of present illness Pt is an 55 y.o. male. Admitted 04/22/20 with difficulty with swallowing and weight loss (increased over the past 4 months) dehydration and hypotension as well as infection (pneumonia). 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  Tracheostomy tube (04/22/2020); Direct laryngoscopy (04/22/2020); and esophagoscopy (04/22/2020). PMH includes: Long history of smoking and ETOH, seizures, low health care literacy.   OT comments  Pt. Has decreased I and safety with ADLs and mobility. Pt. Was able to sit EOB maintaining good sitting balance for ADLs. Pt. Was able to stand for 3-4 min at a time for personal care and for B UE HEP. Pt. Was issued theraband for B UE HEP. Pt. Was ed on B chest press, bicep, tricep, SHLD flex/ext. Pt. Would benefit from further OT to maximize I and safety.   Follow Up Recommendations  Home health OT    Equipment Recommendations  3 in 1 bedside commode    Recommendations for Other Services      Precautions / Restrictions Precautions Precautions: Fall Precaution Comments: trach Restrictions Weight Bearing Restrictions: No       Mobility Bed Mobility Overal bed mobility: Modified Independent                  Transfers       Sit to Stand: Min guard         General transfer comment: Min guard assist to side step to head of bed.    Balance     Sitting balance-[name]: Good       Standing balance-[name]: Fair                             ADL either performed or assessed with clinical judgement   ADL                   Upper Body Dressing : Minimal assistance;Sitting   Lower Body Dressing: Minimal assistance;Sit to/from stand               Functional mobility during ADLs: Min guard General ADL Comments: Pt. with good sitting balance preforming ADLs  sitting EOB.     Vision   Vision Assessment?: No apparent visual deficits   Perception     Praxis      Cognition Arousal/Alertness: Awake/alert Behavior During Therapy: WFL for tasks assessed/performed Overall Cognitive Status: Within Functional Limits for tasks assessed                                          Exercises     Shoulder Instructions       General Comments      Pertinent Vitals/ Pain       Pain Assessment: 0-10 (Simultaneous filing. User may not have seen previous data.) Pain Score: 3  Faces Pain Scale: Hurts a little bit Pain Location: trach side Pain Descriptors / Indicators: Aching Pain Intervention(s): Patient requesting pain meds-RN notified  Home Living                                          Prior Functioning/Environment  Frequency  Min 2X/week        Progress Toward Goals  OT Goals(current goals can now be found in the care plan section)  Progress towards OT goals: Progressing toward goals  Acute Rehab OT Goals Patient Stated Goal: go home OT Goal Formulation: With patient/family Time For Goal Achievement: 05/07/20 Potential to Achieve Goals: Good ADL Goals Pt Will Perform Grooming: standing;with supervision Pt Will Perform Upper Body Dressing: with modified independence;sitting Pt Will Perform Lower Body Dressing: with supervision;sit to/from stand Pt Will Transfer to Toilet: with supervision;ambulating Pt Will Perform Toileting - Clothing Manipulation and hygiene: sit to/from stand;with supervision Additional ADL Goal #1: Pt will recall 3 ways of conserving energy during ADL with zero cues  Plan Discharge plan remains appropriate    Co-evaluation                 AM-PAC OT "6 Clicks" Daily Activity     Outcome Measure   Help from another person eating meals?: Total Help from another person taking care of personal grooming?: A Little Help from another person  toileting, which includes using toliet, bedpan, or urinal?: A Little Help from another person bathing (including washing, rinsing, drying)?: A Little Help from another person to put on and taking off regular upper body clothing?: A Little Help from another person to put on and taking off regular lower body clothing?: A Little 6 Click Score: 16    End of Session Equipment Utilized During Treatment: Gait belt;Oxygen  OT Visit Diagnosis: Unsteadiness on feet (R26.81);Muscle weakness (generalized) (M62.81)   Activity Tolerance Patient tolerated treatment well   Patient Left in bed;with call bell/phone within reach;with nursing/sitter in room   Nurse Communication Patient requests pain meds        Time: 1130-1208 OT Time Calculation (min): 38 min  Charges: OT General Charges $OT Visit: 1 Visit OT Treatments $Self Care/Home Management : 8-22 mins $Therapeutic Exercise: 23-37 mins  Reece Packer OT/L    [place] 04/25/2020, 12:19 PM

## 2020-04-25 NOTE — Progress Notes (Signed)
Per attending okay to restart jevity tube feed.

## 2020-04-25 NOTE — Plan of Care (Signed)
Problem: Activity: Goal: Ability to tolerate increased activity will improve 04/25/2020 2024 by Berta Minor, RN Outcome: Progressing 04/25/2020 2024 by Berta Minor, RN Outcome: Progressing   Problem: Clinical Measurements: Goal: Ability to maintain a body temperature in the normal range will improve 04/25/2020 2024 by Berta Minor, RN Outcome: Progressing 04/25/2020 2024 by Berta Minor, RN Outcome: Progressing   Problem: Respiratory: Goal: Ability to maintain adequate ventilation will improve 04/25/2020 2024 by Berta Minor, RN Outcome: Progressing 04/25/2020 2024 by Berta Minor, RN Outcome: Progressing Goal: Ability to maintain a clear airway will improve 04/25/2020 2024 by Berta Minor, RN Outcome: Progressing 04/25/2020 2024 by Berta Minor, RN Outcome: Progressing   Problem: Education: Goal: Knowledge of General Education information will improve Description: Including pain rating scale, medication(s)/side effects and non-pharmacologic comfort measures 04/25/2020 2024 by Berta Minor, RN Outcome: Progressing 04/25/2020 2024 by Berta Minor, RN Outcome: Progressing   Problem: Health Behavior/Discharge Planning: Goal: Ability to manage health-related needs will improve 04/25/2020 2024 by Berta Minor, RN Outcome: Progressing 04/25/2020 2024 by Berta Minor, RN Outcome: Progressing   Problem: Clinical Measurements: Goal: Ability to maintain clinical measurements within normal limits will improve 04/25/2020 2024 by Berta Minor, RN Outcome: Progressing 04/25/2020 2024 by Berta Minor, RN Outcome: Progressing Goal: Will remain free from infection 04/25/2020 2024 by Berta Minor, RN Outcome: Progressing 04/25/2020 2024 by Berta Minor, RN Outcome: Progressing Goal: Diagnostic test results will improve 04/25/2020 2024 by Berta Minor, RN Outcome:  Progressing 04/25/2020 2024 by Berta Minor, RN Outcome: Progressing Goal: Respiratory complications will improve 04/25/2020 2024 by Berta Minor, RN Outcome: Progressing 04/25/2020 2024 by Berta Minor, RN Outcome: Progressing Goal: Cardiovascular complication will be avoided 04/25/2020 2024 by Berta Minor, RN Outcome: Progressing 04/25/2020 2024 by Berta Minor, RN Outcome: Progressing   Problem: Activity: Goal: Risk for activity intolerance will decrease 04/25/2020 2024 by Berta Minor, RN Outcome: Progressing 04/25/2020 2024 by Berta Minor, RN Outcome: Progressing   Problem: Nutrition: Goal: Adequate nutrition will be maintained 04/25/2020 2024 by Berta Minor, RN Outcome: Progressing 04/25/2020 2024 by Berta Minor, RN Outcome: Progressing   Problem: Coping: Goal: Level of anxiety will decrease 04/25/2020 2024 by Berta Minor, RN Outcome: Progressing 04/25/2020 2024 by Berta Minor, RN Outcome: Progressing   Problem: Elimination: Goal: Will not experience complications related to bowel motility 04/25/2020 2024 by Berta Minor, RN Outcome: Progressing 04/25/2020 2024 by Berta Minor, RN Outcome: Progressing Goal: Will not experience complications related to urinary retention 04/25/2020 2024 by Berta Minor, RN Outcome: Progressing 04/25/2020 2024 by Berta Minor, RN Outcome: Progressing   Problem: Pain Managment: Goal: General experience of comfort will improve 04/25/2020 2024 by Berta Minor, RN Outcome: Progressing 04/25/2020 2024 by Berta Minor, RN Outcome: Progressing   Problem: Safety: Goal: Ability to remain free from injury will improve 04/25/2020 2024 by Berta Minor, RN Outcome: Progressing 04/25/2020 2024 by Berta Minor, RN Outcome: Progressing   Problem: Skin Integrity: Goal: Risk for impaired skin integrity will  decrease 04/25/2020 2024 by Berta Minor, RN Outcome: Progressing 04/25/2020 2024 by Berta Minor, RN Outcome: Progressing   Problem: Nutrition Goal: Patient maintains adequate hydration 04/25/2020 2024 by Berta Minor, RN Outcome: Progressing 04/25/2020 2024 by Berta Minor, RN Outcome: Progressing Goal: Patient maintains weight 04/25/2020 2024 by Berta Minor, RN Outcome: Progressing  04/25/2020 2024 by Berta Minor, RN Outcome: Progressing Goal: Patient/Family demonstrates understanding of diet 04/25/2020 2024 by Berta Minor, RN Outcome: Progressing 04/25/2020 2024 by Berta Minor, RN Outcome: Progressing Goal: Patient/Family independently completes tube feeding 04/25/2020 2024 by Berta Minor, RN Outcome: Progressing 04/25/2020 2024 by Berta Minor, RN Outcome: Progressing Goal: Patient will have no more than 5 lb weight change during LOS 04/25/2020 2024 by Berta Minor, RN Outcome: Progressing 04/25/2020 2024 by Berta Minor, RN Outcome: Progressing Goal: Patient will utilize adaptive techniques to administer nutrition 04/25/2020 2024 by Berta Minor, RN Outcome: Progressing 04/25/2020 2024 by Berta Minor, RN Outcome: Progressing Goal: Patient will verbalize dietary restrictions 04/25/2020 2024 by Berta Minor, RN Outcome: Progressing 04/25/2020 2024 by Berta Minor, RN Outcome: Progressing

## 2020-04-26 ENCOUNTER — Inpatient Hospital Stay (HOSPITAL_COMMUNITY): Payer: Medicaid Other

## 2020-04-26 DIAGNOSIS — N179 Acute kidney failure, unspecified: Secondary | ICD-10-CM | POA: Diagnosis not present

## 2020-04-26 DIAGNOSIS — J9601 Acute respiratory failure with hypoxia: Secondary | ICD-10-CM

## 2020-04-26 DIAGNOSIS — E871 Hypo-osmolality and hyponatremia: Secondary | ICD-10-CM | POA: Diagnosis not present

## 2020-04-26 LAB — GLUCOSE, CAPILLARY
Glucose-Capillary: 117 mg/dL — ABNORMAL HIGH (ref 70–99)
Glucose-Capillary: 145 mg/dL — ABNORMAL HIGH (ref 70–99)
Glucose-Capillary: 109 mg/dL — ABNORMAL HIGH (ref 70–99)
Glucose-Capillary: 135 mg/dL — ABNORMAL HIGH (ref 70–99)
Glucose-Capillary: 121 mg/dL — ABNORMAL HIGH (ref 70–99)

## 2020-04-26 LAB — SURGICAL PATHOLOGY

## 2020-04-26 LAB — MAGNESIUM
Magnesium: 1.3 mg/dL — ABNORMAL LOW (ref 1.7–2.4)
Magnesium: 2.1 mg/dL (ref 1.7–2.4)

## 2020-04-26 LAB — PHOSPHORUS
Phosphorus: 3.4 mg/dL (ref 2.5–4.6)
Phosphorus: 3.3 mg/dL (ref 2.5–4.6)

## 2020-04-26 MED ORDER — PROSOURCE TF PO LIQD: 45.0000 mL | Freq: Two times a day (BID) | ORAL | Status: DC

## 2020-04-26 MED ORDER — PROSOURCE TF PO LIQD
45.0000 mL | Freq: Two times a day (BID) | ORAL | Status: DC
  Administered 2020-04-26 – 2020-04-29 (×7): 45 mL
  Filled 2020-04-26 (×6): qty 45

## 2020-04-26 MED ORDER — GUAIFENESIN-DM 100-10 MG/5ML PO SYRP
10.0000 mL | ORAL_SOLUTION | ORAL | Status: DC | PRN
  Administered 2020-04-26: 10 mL
  Filled 2020-04-26: qty 10

## 2020-04-26 MED ORDER — OSMOLITE 1.2 CAL PO LIQD
1000.0000 mL | ORAL | Status: DC
  Administered 2020-04-26 – 2020-04-27 (×2): 1000 mL
  Filled 2020-04-26 (×6): qty 1000

## 2020-04-26 MED ORDER — MAGNESIUM SULFATE 4 GM/100ML IV SOLN
4.0000 g | Freq: Once | INTRAVENOUS | Status: AC
  Administered 2020-04-26: 4 g via INTRAVENOUS
  Filled 2020-04-26: qty 100

## 2020-04-26 MED ORDER — FUROSEMIDE 10 MG/ML IJ SOLN
60.0000 mg | Freq: Once | INTRAMUSCULAR | Status: AC
  Administered 2020-04-26: 60 mg via INTRAVENOUS
  Filled 2020-04-26: qty 6

## 2020-04-26 MED ORDER — JEVITY 1.2 CAL PO LIQD
1000.0000 mL | ORAL | Status: DC
  Filled 2020-04-26: qty 1000

## 2020-04-26 NOTE — Progress Notes (Signed)
Physical Therapy Treatment Patient Details Name: Mario Peters MRN: [id number] DOB: 1965-10-04 Today's Date: 04/26/2020    History of Present Illness Pt is an 55 y.o. male. Admitted 04/22/20 with difficulty with swallowing and weight loss (increased over the past 4 months) dehydration and hypotension as well as infection (pneumonia). 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  Tracheostomy tube (04/22/2020); Direct laryngoscopy (04/22/2020); and esophagoscopy (04/22/2020). PMH includes: Long history of smoking and ETOH, seizures, low health care literacy.    PT Comments    Pt admitted with above diagnosis. Pt states he was too tired to get OOB.  Pt did however complete UE and LE exercises with mild desaturation with UE exercises to 87% on 35% trach collar. Will continue to progress pt as able. Pt currently with functional limitations due to the deficits listed below (see PT Problem List). Pt will benefit from skilled PT to increase their independence and safety with mobility to allow discharge to the venue listed below.    Follow Up Recommendations  Supervision for mobility/OOB;No PT follow up     Equipment Recommendations  None recommended by PT    Recommendations for Other Services       Precautions / Restrictions Precautions Precautions: Fall Precaution Comments: trach Restrictions Weight Bearing Restrictions: No    Mobility  Bed Mobility               General bed mobility comments: Declined OOB due to fatigue per pt    Transfers                    Ambulation/Gait                 Stairs             Wheelchair Mobility    Modified Rankin (Stroke Patients Only)       Balance                                            Cognition Arousal/Alertness: Awake/alert Behavior During Therapy: WFL for tasks assessed/performed Overall Cognitive Status: Within Functional Limits for tasks assessed                                  General Comments: did a good job using body language to communicate his needs and wants      Exercises General Exercises - Upper Extremity Shoulder Flexion: AROM;Both;Supine;Theraband Theraband Level (Shoulder Flexion): Level 2 (Red) Elbow Flexion: AROM;Both;10 reps;Supine;Theraband Theraband Level (Elbow Flexion): Level 2 (Red) Elbow Extension: AROM;Both;10 reps;Supine;Theraband Theraband Level (Elbow Extension): Level 2 (Red) General Exercises - Lower Extremity Ankle Circles/Pumps: AROM;Both;5 reps;Supine Heel Slides: AROM;Both;5 reps;Supine Straight Leg Raises: AROM;Both;5 reps;Supine    General Comments General comments (skin integrity, edema, etc.): 35% with sats to 87% with exercises of UEs. Recovered quickly after completionof exercises.      Pertinent Vitals/Pain Pain Assessment: No/denies pain    Home Living                      Prior Function            PT Goals (current goals can now be found in the care plan section) Acute Rehab PT Goals Patient Stated Goal: go home Progress towards PT goals: Progressing toward goals  Frequency    Min 3X/week      PT Plan Current plan remains appropriate    Co-evaluation              AM-PAC PT "6 Clicks" Mobility   Outcome Measure  Help needed turning from your back to your side while in a flat bed without using bedrails?: None Help needed moving from lying on your back to sitting on the side of a flat bed without using bedrails?: None Help needed moving to and from a bed to a chair (including a wheelchair)?: A Little Help needed standing up from a chair using your arms (e.g., wheelchair or bedside chair)?: A Little Help needed to walk in hospital room?: A Little Help needed climbing 3-5 steps with a railing? : A Little 6 Click Score: 20    End of Session Equipment Utilized During Treatment: Gait belt;Oxygen Activity Tolerance: Patient limited by fatigue Patient left: with  call bell/phone within reach;with family/visitor present;in bed Nurse Communication: Mobility status PT Visit Diagnosis: Difficulty in walking, not elsewhere classified (R26.2)     Time: 0158-6825 PT Time Calculation (min) (ACUTE ONLY): 13 min  Charges:  $Therapeutic Exercise: 8-22 mins                     Mario Peters M,PT Acute Rehab Services [phone] (pager)   Alvira Philips 04/26/2020, 2:56 PM

## 2020-04-26 NOTE — Progress Notes (Signed)
Patient ID: Mario Peters, male   DOB: December 07, 1965, 55 y.o.   MRN: [id number]  PROGRESS NOTE    Jasn Xia  IFO:[id number] DOB: May 15, 1965 DOA: 04/17/2020 PCP: Patient, [name]   Brief Narrative:  55 year old male with history of alcohol and tobacco abuse presented with weakness, ongoing dysphagia and very poor oral intake for 4 months along with weight loss, hoarseness, cough and congestion.  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  ENT was consulted.  He underwent tracheostomy on 04/22/2020 along with direct laryngoscopy with biopsy.  He was briefly transferred to ICU but has been subsequently transferred out of ICU.  Assessment & Plan:   Severe sepsis: Present on admission Aspiration pneumonia Leukocytosis: Resolved -Sepsis has resolved.  Cultures negative so far. -Status post treatment with cefepime for 7 days.  Also received Zithromax for 5 days.  Laryngeal mass partially obstructing airway Acute respiratory failure with hypoxia Severe dysphagia -Status post tracheostomy, direct laryngoscopy and biopsy and NG tube placement -Was transferred briefly to ICU but has been subsequently transferred out of ICU -Currently still on 8 L oxygen via tracheostomy.  Wean off as able.  Patient received 1 dose of Lasix on 04/25/2020; will give 1 more dose of Lasix today. -Follow further ENT recommendations regarding trach care and follow pathology -SLP following -Status post G-tube placement by IR on 04/24/2020.  Tube feeding started on 04/25/2020.  Patient complains of abdominal pain.  Will get abdominal x-ray.  Hold tube feedings for now.  Dietitian to evaluate and possibly restart feeding at lower rate.  Acute kidney injury -Resolved with hydration  Tobacco abuse -Counseled by prior hospitalist  Alcohol abuse Alcoholic liver  disease -Ultrasound was negative for cirrhosis of liver  Hypoalbuminemia Severe protein calorie malnutrition -Follow nutrition recommendations  Hyponatremia -Mild.  Monitor  Hypomagnesemia -No labs available today.  Hypophosphatemia -Possibly likely from refeeding syndrome.  Improved  DVT prophylaxis: Lovenox Code Status: Full Family Communication: Wife and sister present at bedside on 04/26/2020 Disposition Plan: Status is: Inpatient  Remains inpatient appropriate because:Inpatient level of care appropriate due to severity of illness   Dispo: The patient is from: Home              Anticipated d/c is to: Home              Patient currently is not medically stable to d/c.   Difficult to place patient No   Consultants: ENT/IR  Procedures: tracheostomy, direct laryngoscopy and biopsy   Antimicrobials:  Anti-infectives (From admission, onward)   Start     Dose/Rate Route Frequency Ordered Stop   04/24/20 1800  ceFAZolin (ANCEF) IVPB 2g/100 mL premix        2 g 200 mL/hr over 30 Minutes Intravenous  Once 04/24/20 1354 04/24/20 1657   04/24/20 1120  ceFAZolin (ANCEF) IVPB 2g/100 mL premix        2 g 200 mL/hr over 30 Minutes Intravenous  Once 04/24/20 1052 04/24/20 1050   04/18/20 1800  ceFEPIme (MAXIPIME) 2 g in sodium chloride 0.9 % 100 mL IVPB        2 g 200 mL/hr over 30 Minutes Intravenous Every 8 hours 04/18/20 1029 04/23/20 2359   04/18/20 1100  vancomycin (VANCOREADY) IVPB 750 mg/150 mL  Status:  Discontinued        750 mg 150 mL/hr over 60 Minutes Intravenous Every 12  hours 04/18/20 1029 04/18/20 1039   04/18/20 1000  ceFEPIme (MAXIPIME) 2 g in sodium chloride 0.9 % 100 mL IVPB  Status:  Discontinued        2 g 200 mL/hr over 30 Minutes Intravenous Every 12 hours 04/17/20 2153 04/18/20 1029   04/17/20 2300  azithromycin (ZITHROMAX) 500 mg in sodium chloride 0.9 % 250 mL IVPB  Status:  Discontinued        500 mg 250 mL/hr over 60 Minutes Intravenous Every 24  hours 04/17/20 2253 04/22/20 0950   04/17/20 2153  vancomycin variable dose per unstable renal function (pharmacist dosing)  Status:  Discontinued         Does not apply See admin instructions 04/17/20 2153 04/18/20 1039   04/17/20 1930  vancomycin (VANCOREADY) IVPB 1000 mg/200 mL        1,000 mg 200 mL/hr over 60 Minutes Intravenous  Once 04/17/20 1900 04/17/20 2323   04/17/20 1915  ceFEPIme (MAXIPIME) 2 g in sodium chloride 0.9 % 100 mL IVPB        2 g 200 mL/hr over 30 Minutes Intravenous  Once 04/17/20 1900 04/17/20 2101   04/17/20 1915  metroNIDAZOLE (FLAGYL) IVPB 500 mg        500 mg 100 mL/hr over 60 Minutes Intravenous  Once 04/17/20 1900 04/17/20 2209        Subjective: Patient seen and examined at bedside.  No worsening shortness of breath, fever, vomiting reported.  Poor historian.  Complains of abdominal pain.  No nausea or vomiting.  Tolerating feeds via tube. Objective: Vitals:   04/25/20 2325 04/26/20 0100 04/26/20 0425 04/26/20 0718  BP: 120/75 127/75  121/64  Pulse: 60 82 73 78  Resp: 20 18 (!) 22 (!) 21  Temp: 98.4 F (36.9 C) 98.3 F (36.8 C)  98.7 F (37.1 C)  TempSrc: Oral Oral  Oral  SpO2: 96% 96%  97%  Weight:      Height:        Intake/Output Summary (Last 24 hours) at 04/26/2020 0748 Last data filed at 04/25/2020 0839 Gross per 24 hour  Intake --  Output 275 ml  Net -275 ml   Filed Weights   04/17/20 2132 04/19/20 0736  Weight: 65.8 kg 51.6 kg    Examination:  General exam: Chronically ill and deconditioned.  No distress. ENT: Currently on 8 L oxygen via tracheostomy Respiratory system: Bilateral decreased breath sounds at bases with some crackles and intermittent tachypnea Cardiovascular system: S1-S2 heard, rate controlled gastrointestinal system: Abdomen is mildly distended, soft and mildly tender.  No rebound tenderness present.  G-tube present.  Bowel sounds heard.   Extremities: No cyanosis; mild lower extremity edema present Central  nervous system: Awake, nods his head to some questions.  Poor historian; no focal neurological deficits.  Moving extremities  skin: No obvious petechiae/rashes Psychiatry: Flat affect  Data Reviewed: I have personally reviewed following labs and imaging studies  CBC: Recent Labs  Lab 04/22/20 0409 04/22/20 1144 04/22/20 1539 04/23/20 0120 04/24/20 0305 04/25/20 0200  WBC 13.4*  --  13.2* 8.7 9.3 8.0  NEUTROABS  --   --  12.3*  --   --  5.9  HGB 12.8* 13.9 12.5* 12.6* 11.4* 11.5*  HCT 38.9* 41.0 37.9* 37.9* 34.7* 34.9*  MCV 91.3  --  91.3 92.2 93.5 93.3  PLT 217  --  219 210 237 400   Basic Metabolic Panel: Recent Labs  Lab 04/22/20 0409 04/22/20 1144 04/22/20 1539 04/23/20  0120 04/24/20 0305 04/24/20 1751 04/24/20 1809 04/25/20 0200  NA 133* 132* 134* 133* 135 134*  --   --   K 2.9* 3.5 3.4* 4.1 3.9 4.3  --   --   CL 86* 84* 87* 89* 97* 97*  --   --   CO2 37*  --  37* 32 35* 33*  --   --   GLUCOSE 68* 72 78 112* 123* 102*  --   --   BUN 6 6 6 11 12 7   --   --   CREATININE 0.50* 0.40* 0.53* 0.56* 0.38* 0.39*  --   --   CALCIUM 8.0*  --  8.1* 7.9* 7.6* 7.7*  --   --   MG 1.2*  --  1.1* 1.5* 1.6*  --   --  1.4*  PHOS 2.9  --  2.2* 2.2* <1.0*  --  3.0 3.0   GFR: Estimated Creatinine Clearance: 77 mL/min (A) (by C-G formula based on SCr of 0.39 mg/dL (L)). Liver Function Tests: Recent Labs  Lab 04/20/20 0110 04/21/20 0025 04/22/20 1539  AST 35 25 20  ALT 22 19 17   ALKPHOS 56 56 60  BILITOT 0.5 0.8 1.1  PROT 4.9* 5.2* 5.4*  ALBUMIN 1.7* 1.8* 1.8*   No results for input(s): LIPASE, AMYLASE in the last 168 hours. No results for input(s): AMMONIA in the last 168 hours. Coagulation Profile: No results for input(s): INR, PROTIME in the last 168 hours. Cardiac Enzymes: No results for input(s): CKTOTAL, CKMB, CKMBINDEX, TROPONINI in the last 168 hours. BNP (last 3 results) No results for input(s): PROBNP in the last 8760 hours. HbA1C: No results for input(s):  HGBA1C in the last 72 hours. CBG: Recent Labs  Lab 04/25/20 1601 04/25/20 1930 04/25/20 2328 04/26/20 0237 04/26/20 0715  GLUCAP 92 101* 128* 117* 145*   Lipid Profile: No results for input(s): CHOL, HDL, LDLCALC, TRIG, CHOLHDL, LDLDIRECT in the last 72 hours. Thyroid Function Tests: No results for input(s): TSH, T4TOTAL, FREET4, T3FREE, THYROIDAB in the last 72 hours. Anemia Panel: No results for input(s): VITAMINB12, FOLATE, FERRITIN, TIBC, IRON, RETICCTPCT in the last 72 hours. Sepsis Labs: No results for input(s): PROCALCITON, LATICACIDVEN in the last 168 hours.  Recent Results (from the past 240 hour(s))  Blood Culture (routine x 2)     Status: None   Collection Time: 04/17/20  7:35 PM   Specimen: BLOOD  Result Value Ref Range Status   Specimen Description BLOOD RIGHT ANTECUBITAL  Final   Special Requests   Final    BOTTLES DRAWN AEROBIC AND ANAEROBIC Blood Culture adequate volume   Culture   Final    NO GROWTH 6 DAYS Performed at [facility], [address], [place], [place] [address]    Report Status 04/23/2020 FINAL  Final  Resp Panel by RT-PCR (Flu A&B, Covid) Nasopharyngeal Swab     Status: None   Collection Time: 04/17/20  9:33 PM   Specimen: Nasopharyngeal Swab; Nasopharyngeal(NP) swabs in vial transport medium  Result Value Ref Range Status   SARS Coronavirus 2 by RT PCR NEGATIVE NEGATIVE Final    Comment: (NOTE) SARS-CoV-2 target nucleic acids are NOT DETECTED.  The SARS-CoV-2 RNA is generally detectable in upper respiratory specimens during the acute phase of infection. The lowest concentration of SARS-CoV-2 viral copies this assay can detect is 138 copies/mL. A negative result does not preclude SARS-Cov-2 infection and should not be used as the sole basis for treatment or other patient  management decisions. A negative result may occur with  improper specimen collection/handling, submission of specimen other than nasopharyngeal swab, presence of  viral mutation(s) within the areas targeted by this assay, and inadequate number of viral copies(<138 copies/mL). A negative result must be combined with clinical observations, patient history, and epidemiological information. The expected result is Negative.  Fact Sheet for Patients:  EntrepreneurPulse.com.au  Fact Sheet for Healthcare Providers:  IncredibleEmployment.be  This test is no t yet approved or cleared by the [place] FDA and  has been authorized for detection and/or diagnosis of SARS-CoV-2 by FDA under an Emergency Use Authorization (EUA). This EUA will remain  in effect (meaning this test can be used) for the duration of the COVID-19 declaration under Section 564(b)(1) of the Act, 21 U.S.C.section 360bbb-3(b)(1), unless the authorization is terminated  or revoked sooner.       Influenza A by PCR NEGATIVE NEGATIVE Final   Influenza B by PCR NEGATIVE NEGATIVE Final    Comment: (NOTE) The Xpert Xpress SARS-CoV-2/FLU/RSV plus assay is intended as an aid in the diagnosis of influenza from Nasopharyngeal swab specimens and should not be used as a sole basis for treatment. Nasal washings and aspirates are unacceptable for Xpert Xpress SARS-CoV-2/FLU/RSV testing.  Fact Sheet for Patients: EntrepreneurPulse.com.au  Fact Sheet for Healthcare Providers: IncredibleEmployment.be  This test is not yet approved or cleared by the [place] FDA and has been authorized for detection and/or diagnosis of SARS-CoV-2 by FDA under an Emergency Use Authorization (EUA). This EUA will remain in effect (meaning this test can be used) for the duration of the COVID-19 declaration under Section 564(b)(1) of the Act, 21 U.S.C. section 360bbb-3(b)(1), unless the authorization is terminated or revoked.  Performed at [facility], [place] [address], [place], [place] [address]   Blood Culture (routine x 2)      Status: None   Collection Time: 04/17/20 10:07 PM   Specimen: BLOOD RIGHT FOREARM  Result Value Ref Range Status   Specimen Description BLOOD RIGHT FOREARM  Final   Special Requests   Final    BOTTLES DRAWN AEROBIC AND ANAEROBIC Blood Culture adequate volume   Culture   Final    NO GROWTH 6 DAYS Performed at [facility], [place] [address], [place], [place] [address]    Report Status 04/23/2020 FINAL  Final  MRSA PCR Screening     Status: None   Collection Time: 04/23/20  4:15 PM   Specimen: Nasopharyngeal  Result Value Ref Range Status   MRSA by PCR NEGATIVE NEGATIVE Final    Comment:        The GeneXpert MRSA Assay (FDA approved for NASAL specimens only), is one component of a comprehensive MRSA colonization surveillance program. It is not intended to diagnose MRSA infection nor to guide or monitor treatment for MRSA infections. Performed at [facility], [place] [address] [place], [place] [address]          Radiology Studies: IR GASTROSTOMY TUBE MOD SED  Result Date: 04/24/2020 INDICATION: History of head neck cancer. Please perform percutaneous gastrostomy tube placement for enteric nutrition supplementation purposes. EXAM: PULL TROUGH GASTROSTOMY TUBE PLACEMENT COMPARISON:  CT abdomen and pelvis-04/22/2020 MEDICATIONS: Ancef 2 gm IV; Antibiotics were administered within 1 hour of the procedure. Glucagon 1 mg IV CONTRAST:  20 mL of Omnipaque 300 administered into the gastric lumen. ANESTHESIA/SEDATION: Moderate (conscious) sedation was employed during this procedure. A total of Versed 1 mg and Fentanyl 50 mcg was  administered intravenously. Moderate Sedation Time: 35 minutes. The patient's level of consciousness and vital signs were monitored continuously by radiology nursing throughout the procedure under my direct supervision. FLUOROSCOPY TIME:  6 minutes (21 mGy) COMPLICATIONS: None immediate. PROCEDURE: Informed written consent was obtained from the patient  following explanation of the procedure, risks, benefits and alternatives. A time out was performed prior to the initiation of the procedure. Ultrasound scanning was performed to demarcate the edge of the left lobe of the liver. Maximal barrier sterile technique utilized including caps, mask, sterile gowns, sterile gloves, large sterile drape, hand hygiene and Betadine prep. The left upper quadrant was sterilely prepped and draped. An oral gastric catheter was inserted into the stomach under fluoroscopy. The existing nasogastric feeding tube was removed. The left costal margin and air opacified transverse colon were identified and avoided. Air was injected into the stomach for insufflation and visualization under fluoroscopy. Under sterile conditions a 17 gauge trocar needle was utilized to access the stomach percutaneously beneath the left subcostal margin after the overlying soft tissues were anesthetized with 1% Lidocaine with epinephrine. Needle position was confirmed within the stomach with aspiration of air and injection of small amount of contrast. A single T tack was deployed for gastropexy. Over an Amplatz guide wire, a 9-French sheath was inserted into the stomach. A snare device was utilized to capture the oral gastric catheter. The snare device was pulled retrograde from the stomach up the esophagus and out the oropharynx. The 20-French pull-through gastrostomy was connected to the snare device and pulled antegrade through the oropharynx down the esophagus into the stomach and then through the percutaneous tract external to the patient. The gastrostomy was assembled externally. Contrast injection confirms position in the stomach. Several spot radiographic images were obtained in various obliquities for documentation. The patient tolerated procedure well without immediate post procedural complication. FINDINGS: After successful fluoroscopic guided placement, the gastrostomy tube is appropriately positioned  with internal disc against the ventral aspect of the gastric lumen. IMPRESSION: Successful fluoroscopic insertion of a 20-French pull-through gastrostomy tube. The gastrostomy may be used immediately for medication administration and in 24 hrs for the initiation of feeds. Electronically Signed   By: Sandi Mariscal M.D.   On: 04/24/2020 12:17   DG Swallowing Func-Speech Pathology  Result Date: 04/25/2020 Objective Swallowing Evaluation: Type of Study: MBS-Modified Barium Swallow Study  Patient Details Name: Ashish Rossetti MRN: [id number] Date of Birth: 23-May-1965 Today's Date: 04/25/2020 Time: SLP Start Time (ACUTE ONLY): 1100 -SLP Stop Time (ACUTE ONLY): 1130 SLP Time Calculation (min) (ACUTE ONLY): 30 min Past Medical History: Past Medical History: Diagnosis Date . Seizures ([place])  Past Surgical History: Past Surgical History: Procedure Laterality Date . DIRECT LARYNGOSCOPY N/A 04/22/2020  Procedure: DIRECT LARYNGOSCOPY WITH BIOPSY;  Surgeon: [name], MD;  Location: [facility];  Service: ENT;  Laterality: N/A; . ESOPHAGOSCOPY  04/22/2020  Procedure: ESOPHAGOSCOPY;  Surgeon: [name], MD;  Location: [facility];  Service: ENT;; . IR GASTROSTOMY TUBE MOD SED  04/24/2020 . TRACHEOSTOMY TUBE PLACEMENT N/A 04/22/2020  Procedure: TRACHEOSTOMY;  Surgeon: [name], MD;  Location: [facility] OR;  Service: ENT;  Laterality: N/A; HPI: Pt is a 55 y.o. male with medical history significant of EtOH abuse,  tobacco abuse, remote hx of seizures. He presented via EMS due to inability to get out of bed, weight loss, and productive cough x4 months and increased difficulty swallowing. CXR 3/30: Ill-defined left greater than right reticulonodular and ground-glass opacity, suspicious for bilateral pneumonia, potentially atypical  or viral pneumonia. CT soft tissue: Mildly hyperdense focus at the posterior aspect of the left vocal fold with associated soft tissue thickening. 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 airway is partially compromised.Trach recommended.  Pt was to have trach placed on Friday 4/1 but it was cancelled, is now tentatively scheduled for [place] 4/4.  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  There were no recognizable structures.  It spilled over along the lateral pharyngeal wall on the right side.  The left piriform sinus appeared to be clear."  Subjective: -- (alert, talking with PMV placed intermittently) Assessment / Plan / Recommendation CHL IP CLINICAL IMPRESSIONS 04/25/2020 Clinical Impression Pt participated in brief MBS - PMV placed for one of only two boluses given due to the level of impairment and severe aspiration. He demonstrated a profound dysphagia with no ability to close the laryngeal vestibule.  There was marginal movement of the hyoid, no epiglottic inversion, no approximation of the arytenoids to epiglottic base, and no identifiable onset of the pharyngeal swallow.  One bolus each of nectar and thin liquid was observed to spill immediately into the larynx, eliciting a cough, and pt was able to move some of the bolus back into the oral cavity for suctioning. What remained of the bolus was immediately aspirated, a portion of which was ejected from the trach tube.  Mr. Scibilia watched the video in real time - we discussed his swallowing deficits and his inability to eat safely or comfortably at this time.  SLP will follow for further education.  Continue NPO with PEG for nutrition. SLP Visit Diagnosis Dysphagia, pharyngeal phase (R13.13) Attention and concentration deficit following -- Frontal lobe and executive function deficit following --  Impact on safety and function Severe aspiration risk   CHL IP TREATMENT RECOMMENDATION 04/25/2020 Treatment Recommendations Therapy as outlined in treatment plan below   No flowsheet data found. CHL IP DIET RECOMMENDATION 04/25/2020 SLP Diet Recommendations NPO Liquid Administration via -- Medication Administration -- Compensations -- Postural Changes --   CHL IP OTHER RECOMMENDATIONS 04/25/2020 Recommended Consults -- Oral Care Recommendations Oral care QID Other Recommendations Have oral suction available   CHL IP FOLLOW UP RECOMMENDATIONS 04/25/2020 Follow up Recommendations Other (comment)   CHL IP FREQUENCY AND DURATION 04/25/2020 Speech Therapy Frequency (ACUTE ONLY) min 2x/week Treatment Duration 2 weeks      CHL IP ORAL PHASE 04/25/2020 Oral Phase WFL Oral - Pudding Teaspoon -- Oral - Pudding Cup -- Oral - Honey Teaspoon -- Oral - Honey Cup -- Oral - Nectar Teaspoon -- Oral - Nectar Cup -- Oral - Nectar Straw -- Oral - Thin Teaspoon -- Oral - Thin Cup -- Oral - Thin Straw -- Oral - Puree -- Oral - Mech Soft -- Oral - Regular -- Oral - Multi-Consistency -- Oral - Pill -- Oral Phase - Comment --  CHL IP PHARYNGEAL PHASE 04/25/2020 Pharyngeal Phase Impaired Pharyngeal- Pudding Teaspoon -- Pharyngeal -- Pharyngeal- Pudding Cup -- Pharyngeal -- Pharyngeal- Honey Teaspoon -- Pharyngeal -- Pharyngeal- Honey Cup -- Pharyngeal -- Pharyngeal- Nectar Teaspoon Reduced epiglottic inversion;Reduced anterior laryngeal mobility;Reduced laryngeal elevation;Reduced airway/laryngeal closure;Penetration/Aspiration before swallow;Significant aspiration (Amount);Pharyngeal residue - pyriform;Pharyngeal residue - valleculae Pharyngeal Material enters airway,  passes BELOW cords and not ejected out despite cough attempt by patient Pharyngeal- Nectar Cup -- Pharyngeal -- Pharyngeal- Nectar Straw -- Pharyngeal -- Pharyngeal- Thin Teaspoon -- Pharyngeal -- Pharyngeal- Thin Cup -- Pharyngeal -- Pharyngeal- Thin Straw Reduced epiglottic  inversion;Reduced anterior laryngeal mobility;Reduced laryngeal elevation;Reduced airway/laryngeal closure;Penetration/Aspiration before swallow;Significant aspiration (Amount);Pharyngeal residue - pyriform;Pharyngeal residue - valleculae Pharyngeal Material enters airway, passes BELOW cords and not ejected out despite cough attempt by patient Pharyngeal- Puree -- Pharyngeal -- Pharyngeal- Mechanical Soft -- Pharyngeal -- Pharyngeal- Regular -- Pharyngeal -- Pharyngeal- Multi-consistency -- Pharyngeal -- Pharyngeal- Pill -- Pharyngeal -- Pharyngeal Comment --  No flowsheet data found. Juan Quam Laurice 04/25/2020, 11:50 AM                   Scheduled Meds: . chlorhexidine  15 mL Mouth Rinse BID  . Chlorhexidine Gluconate Cloth  6 each Topical Daily  . enoxaparin (LOVENOX) injection  40 mg Subcutaneous Daily  . feeding supplement (PROSource TF)  45 mL Per Tube BID  . mouth rinse  15 mL Mouth Rinse q12n4p  . nicotine  21 mg Transdermal Daily  . potassium & sodium phosphates  2 packet Per Tube TID WC & HS   Continuous Infusions: . feeding supplement (JEVITY 1.2 CAL) 1,000 mL (04/26/20 1443)          Aline August, MD [facility] 04/26/2020, 7:48 AM

## 2020-04-26 NOTE — Progress Notes (Addendum)
Nutrition Follow-up  DOCUMENTATION CODES:  Severe malnutrition in context of acute illness/injury  INTERVENTION:  Initiate tube feeding via PEG: Osmolite 1.2 at 20 ml/hr, increase by 10 ml every 8 hours to goal rate of 70 ml/hr (1680 ml per day) Prosource TF 45 ml BID  Provides 2096 kcal, 115 gm protein, 1361 ml free water daily.  If/when transitioning to bolus feeds via PEG: 1.5 carton of Osmolite 1.5 - 4 times per day (8a, 12p, 4p, 8p) - total of 6 cartons daily Prosource TF 45 ml daily  Provides 2213 kcal, 104 gm protein, 1080 ml free water daily. Water flushes of 220 ml 5 times per day in-between bolus feeds  Continue to monitor Monitor magnesium, potassium, and phosphorus daily for at least 3 days, MD to replete as needed, as pt is at risk for refeeding syndrome given severe malnutrition.  NUTRITION DIAGNOSIS:  Severe Malnutrition related to acute illness,dysphagia (laryngeal mass) as evidenced by severe fat depletion,severe muscle depletion,per patient/family report - ongoing  GOAL:  Patient will meet greater than or equal to 90% of their needs - meeting with TF  MONITOR:  TF tolerance,Weight trends,Labs,I & O's  REASON FOR ASSESSMENT:  Consult Enteral/tube feeding initiation and management  ASSESSMENT:  55 yo male with a PMH of EtOH and tobacco abuse who presents with severe sepsis, AKI, severe dysphagia 2/2 laryngeal mass, and aspiration PNA. 4/4 - NG tube placement, s/p tracheostomy; direct laryngoscopy with biopsy 4/6 - G-tube placement 4/7 - SLP rec'd NPO  Previous TF orders: Jevity 1.2 at 30 ml/h, increase by 10 ml every 8 hours to goal rate of 70 ml/h (1680 ml per day), Prosource TF 45 ml BID - never met goal rate (296 ml of total TF) before PEG placed. Pt still at refeeding risk.  Consult for TF recommendations for new placement of PEG tube. See interventions for both continuous and bolus feeding depending on plan.  Mag noted to still be low. Continue to  check K, Mag, and Phos for refeeding given severe malnutrition diagnosis and replete as needed.  Addendum: RN secure chatted RD that pt is having abdominal pain with Jevity 1.2 at 30 ml/hr with PEG. Recommend switching to Osmolite 1.2 at 20 ml to start with. Could be an issue with fiber digestion as Jevity has fiber and Osmolite does not.  Relevant Medications: lasix, Phos-Nak 2 packets TID, hydrocodone Labs: reviewed; CBG 92-145, Mag 1.4  Diet Order:   Diet Order            Diet NPO time specified Except for: Sips with Meds  Diet effective midnight                EDUCATION NEEDS:  Education needs have been addressed  Skin:  Skin Assessment: Reviewed RN Assessment  Last BM:  04/25/20  Height:  Ht Readings from Last 1 Encounters:  04/19/20 5' 7"  (1.702 m)   Weight:  Wt Readings from Last 1 Encounters:  04/19/20 51.6 kg   Ideal Body Weight:  67.3 kg  BMI:  Body mass index is 17.82 kg/m.  Estimated Nutritional Needs:  Kcal:  1950-2150 Protein:  100-115 grams Fluid:  >2 L  Derrel Nip, RD, LDN Registered Dietitian After Hours/Weekend Pager # in [place]

## 2020-04-26 NOTE — Progress Notes (Signed)
Doing well, had gastrostomy tube placed.  Tracheostomy was changed to a #6 cuffless.  He tolerated this well and is able to talk a little bit with finger occlusion.  We discussed a little bit about the pathology which revealed squamous cell carcinoma.  Treatment options are going to include total laryngectomy with bilateral neck dissection and postop radiation.  Other option is going to be chemotherapy with radiation as laryngeal preservation.  Surgery would be performed at [facility] due to the probable need for flap reconstruction.  Chemo and radiation can be here.  We will need medical and radiation oncology consultation as well as dental medicine consultation prior to treatment.  This can all be done as an outpatient if he is able to be discharged at some point or if not we can make arrangements to have all of this done next week as an inpatient.

## 2020-04-26 NOTE — Progress Notes (Signed)
RT called to pt room for desat. Upon arrival to pt room pt in no distress but SpO2 87-90% on 8L 35% Trach collar. Pt FiO2 increased to 80% 10L without improvement in SpO2. Pt refuses to be suctioned at this time. Pt endorses no shortness of breath, has abdominal pain at this time and wanting a drink. Pt and family informed RT would notify RN of needs. RT to assess pt after pain meds. Charge RN aware of changes. RT will continue to monitor and be available as needed.

## 2020-04-26 NOTE — Progress Notes (Signed)
Speech Language Pathology Treatment: Dysphagia  Patient Details Name: Mario Peters MRN: [id number] DOB: Sep 26, 1965 Today's Date: 04/26/2020 Time: 5631-4970 SLP Time Calculation (min) (ACUTE ONLY): 33.38 min  Assessment / Plan / Recommendation Clinical Impression  Pt was seen for treatment with his sister and wife present. Pt and his family were educated regarding the results of the modified barium swallow study, his high aspiration risk and the recommendation for NPO at this time. Video recording of the study was used to facilitate education and all parties verbalized understanding regarding all areas of education. Pt exhibited multiple swallows followed by significant coughing and oral suctioning with even small ice chips. Considering this, the likelihood of this providing any significant comfort is questioned. Oral swabs were placed in very cold water and pt tolerated trace thin liquids during oral swabbing with minimal intermittent throat clearing. Pt remarked, "I would throw all that ice away if I could just do this" and it was agreed that intermittent oral swabbing with cold water will be allowed; however, frequent oral care will be necessary. Vitals were RR 28, SpO2 94, and HR 94 at baseline and he tolerated finger occlusion without evidence of air trapping. He tolerated PMSV for 30 minutes with vitals ranging RR 15-20, SpO2 93-97, and HR 95-120 during this period. Pt denied any respiratory difficulty. Vocal quality was hoarse and vocal intensity reduced which together reduced speech intelligibility. Breath support was mild-moderately reduced, but this improved as the session progressed. It is recommended that PMSV be used with all therapies at this time and that full supervision be provided. SLP will continue to follow pt.    HPI HPI: Pt is a 55 y.o. male with medical history significant of EtOH abuse,  tobacco abuse, remote hx of seizures. He presented via EMS due to inability to get out of  bed, weight loss, and productive cough x4 months and increased difficulty swallowing. CXR 3/30: Ill-defined left greater than right reticulonodular and ground-glass opacity, suspicious for bilateral pneumonia, potentially atypical or viral pneumonia. CT soft tissue: Mildly hyperdense focus at the posterior aspect of the left vocal fold with associated soft tissue thickening. 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 airway is partially compromised.Trach recommended.  Pt was to have trach placed on Friday 4/1 but it was cancelled, is now tentatively scheduled for [place] 4/4.  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  There were no recognizable structures.  It spilled over along the lateral pharyngeal wall on the right side.  The left piriform sinus appeared to be clear." G-tube placed 4/6. ENT note 4/8: pathology which revealed squamous cell carcinoma; Treatment options are going to include total laryngectomy with bilateral neck dissection and postop radiation. Other option is going to be chemotherapy with radiation as laryngeal preservation.  Surgery would be performed at [facility] due to the probable need for flap reconstruction, but chemo and radiation can be done here.      SLP Plan  Continue with current plan of care       Recommendations  Diet recommendations: NPO Medication Administration: Via alternative means      Patient may use Passy-Muir Speech Valve: During all therapies with supervision PMSV Supervision: Full  Oral Care Recommendations: Oral care QID Follow up Recommendations: Outpatient SLP SLP  Visit Diagnosis: Dysphagia, pharyngeal phase (R13.13) Plan: Continue with current plan of care       Mario Peters, [place], [place] Office number [phone] Pager [place] 04/26/2020, 6:06 PM

## 2020-04-27 DIAGNOSIS — N179 Acute kidney failure, unspecified: Secondary | ICD-10-CM | POA: Diagnosis not present

## 2020-04-27 DIAGNOSIS — J189 Pneumonia, unspecified organism: Secondary | ICD-10-CM

## 2020-04-27 DIAGNOSIS — C329 Malignant neoplasm of larynx, unspecified: Secondary | ICD-10-CM

## 2020-04-27 DIAGNOSIS — E43 Unspecified severe protein-calorie malnutrition: Secondary | ICD-10-CM | POA: Diagnosis not present

## 2020-04-27 DIAGNOSIS — J387 Other diseases of larynx: Secondary | ICD-10-CM | POA: Diagnosis not present

## 2020-04-27 LAB — CBC WITH DIFFERENTIAL/PLATELET
Abs Immature Granulocytes: 0.09 10*3/uL — ABNORMAL HIGH (ref 0.00–0.07)
Basophils Absolute: 0.1 10*3/uL (ref 0.0–0.1)
Basophils Relative: 0 %
Eosinophils Absolute: 0.1 10*3/uL (ref 0.0–0.5)
Eosinophils Relative: 1 %
HCT: 38.5 % — ABNORMAL LOW (ref 39.0–52.0)
Hemoglobin: 13.1 g/dL (ref 13.0–17.0)
Immature Granulocytes: 1 %
Lymphocytes Relative: 8 %
Lymphs Abs: 1.3 10*3/uL (ref 0.7–4.0)
MCH: 31.1 pg (ref 26.0–34.0)
MCHC: 34 g/dL (ref 30.0–36.0)
MCV: 91.4 fL (ref 80.0–100.0)
Monocytes Absolute: 0.9 10*3/uL (ref 0.1–1.0)
Monocytes Relative: 6 %
Neutro Abs: 13 10*3/uL — ABNORMAL HIGH (ref 1.7–7.7)
Neutrophils Relative %: 84 %
Platelets: 274 10*3/uL (ref 150–400)
RBC: 4.21 MIL/uL — ABNORMAL LOW (ref 4.22–5.81)
RDW: 12 % (ref 11.5–15.5)
WBC: 15.4 10*3/uL — ABNORMAL HIGH (ref 4.0–10.5)
nRBC: 0 % (ref 0.0–0.2)

## 2020-04-27 LAB — COMPREHENSIVE METABOLIC PANEL
ALT: 11 U/L (ref 0–44)
AST: 18 U/L (ref 15–41)
Albumin: 2.2 g/dL — ABNORMAL LOW (ref 3.5–5.0)
Alkaline Phosphatase: 62 U/L (ref 38–126)
Anion gap: 9 (ref 5–15)
BUN: 15 mg/dL (ref 6–20)
CO2: 26 mmol/L (ref 22–32)
Calcium: 8.5 mg/dL — ABNORMAL LOW (ref 8.9–10.3)
Chloride: 98 mmol/L (ref 98–111)
Creatinine, Ser: 0.47 mg/dL — ABNORMAL LOW (ref 0.61–1.24)
GFR, Estimated: >60 mL/min (ref >60)
Glucose, Bld: 116 mg/dL — ABNORMAL HIGH (ref 70–99)
Potassium: 5 mmol/L (ref 3.5–5.1)
Sodium: 133 mmol/L — ABNORMAL LOW (ref 135–145)
Total Bilirubin: 0.9 mg/dL (ref 0.3–1.2)
Total Protein: 6 g/dL — ABNORMAL LOW (ref 6.5–8.1)

## 2020-04-27 LAB — GLUCOSE, CAPILLARY
Glucose-Capillary: 113 mg/dL — ABNORMAL HIGH (ref 70–99)
Glucose-Capillary: 117 mg/dL — ABNORMAL HIGH (ref 70–99)
Glucose-Capillary: 121 mg/dL — ABNORMAL HIGH (ref 70–99)
Glucose-Capillary: 130 mg/dL — ABNORMAL HIGH (ref 70–99)
Glucose-Capillary: 130 mg/dL — ABNORMAL HIGH (ref 70–99)
Glucose-Capillary: 136 mg/dL — ABNORMAL HIGH (ref 70–99)

## 2020-04-27 LAB — PHOSPHORUS: Phosphorus: 3.7 mg/dL (ref 2.5–4.6)

## 2020-04-27 LAB — MAGNESIUM: Magnesium: 2 mg/dL (ref 1.7–2.4)

## 2020-04-27 MED ORDER — FUROSEMIDE 10 MG/ML IJ SOLN
60.0000 mg | Freq: Once | INTRAMUSCULAR | Status: AC
  Administered 2020-04-27: 60 mg via INTRAVENOUS
  Filled 2020-04-27: qty 6

## 2020-04-27 NOTE — Progress Notes (Signed)
Mario Peters   DOB:Jul 20, 1965   FR#:[id number]   APO#:[id number]  Oncology brief note   Subjective: 55 year old gentleman with past medical history of alcohol and tobacco abuse, presented with weakness, dysphagia, hoarseness, cough,, anorexia and weight loss for 4 months.  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  He is status post tracheostomy.  He is currently being treated for aspiration pneumonia.  I was called to see him.  Chart reviewed.  I met his wife and daughter at bedside.   Objective:  Vitals:   04/27/20 1143 04/27/20 1146  BP: (!) 89/67   Pulse: 98 98  Resp: 16 19  Temp:  97.7 F (36.5 C)  SpO2: 95%     Body mass index is 17.68 kg/m.  Intake/Output Summary (Last 24 hours) at 04/27/2020 1438 Last data filed at 04/27/2020 0427 Gross per 24 hour  Intake 410.17 ml  Output 250 ml  Net 160.17 ml     Sclerae unicteric, cachectic Caucasian male.  No cervical or peripheral adenopathy  Lungs bilateral diffuse rhonchi  Heart regular rate and rhythm  Abdomen benign, (+) feeding tube   MSK no focal spinal tenderness, no peripheral edema  Neuro nonfocal    CBG (last 3)  Recent Labs    04/27/20 0442 04/27/20 0802 04/27/20 1145  GLUCAP 130* 117* 136*     Labs:  Urine Studies No results for input(s): UHGB, CRYS in the last 72 hours.  Invalid input(s): UACOL, UAPR, USPG, UPH, UTP, UGL, UKET, UBIL, UNIT, UROB, ULEU, UEPI, UWBC, URBC, UBAC, CAST, UCOM, [place]  Basic Metabolic Panel: Recent Labs  Lab 04/22/20 1539 04/23/20 0120 04/24/20 0305 04/24/20 1751 04/24/20 1809 04/25/20 0200 04/26/20 0950 04/26/20 1626 04/27/20 0258  NA 134* 133* 135 134*  --   --   --   --  133*  K 3.4* 4.1 3.9 4.3  --   --   --   --  5.0  CL 87* 89* 97* 97*  --   --   --   --  98  CO2 37* 32 35* 33*  --   --   --   --  26  GLUCOSE 78 112* 123* 102*  --   --   --   --  116*  BUN 6 11 12 7   --   --   --   --  15  CREATININE 0.53* 0.56* 0.38* 0.39*  --    --   --   --  0.47*  CALCIUM 8.1* 7.9* 7.6* 7.7*  --   --   --   --  8.5*  MG 1.1* 1.5* 1.6*  --   --  1.4* 1.3* 2.1 2.0  PHOS 2.2* 2.2* <1.0*  --  3.0 3.0 3.4 3.3 3.7   GFR Estimated Creatinine Clearance: 76.4 mL/min (A) (by C-G formula based on SCr of 0.47 mg/dL (L)). Liver Function Tests: Recent Labs  Lab 04/21/20 0025 04/22/20 1539 04/27/20 0258  AST 25 20 18   ALT 19 17 11   ALKPHOS 56 60 62  BILITOT 0.8 1.1 0.9  PROT 5.2* 5.4* 6.0*  ALBUMIN 1.8* 1.8* 2.2*   No results for input(s): LIPASE, AMYLASE in the last 168 hours. No results for input(s): AMMONIA in the last 168 hours. Coagulation profile No results for input(s): INR, PROTIME in the last 168 hours.  CBC: Recent Labs  Lab 04/22/20 1539 04/23/20 0120 04/24/20 0305 04/25/20 0200 04/27/20 0258  WBC 13.2* 8.7 9.3 8.0 15.4*  NEUTROABS 12.3*  --   --  5.9 13.0*  HGB 12.5* 12.6* 11.4* 11.5* 13.1  HCT 37.9* 37.9* 34.7* 34.9* 38.5*  MCV 91.3 92.2 93.5 93.3 91.4  PLT 219 210 237 234 274   Cardiac Enzymes: No results for input(s): CKTOTAL, CKMB, CKMBINDEX, TROPONINI in the last 168 hours. BNP: Invalid input(s): POCBNP CBG: Recent Labs  Lab 04/26/20 2014 04/27/20 0022 04/27/20 0442 04/27/20 0802 04/27/20 1145  GLUCAP 121* 130* 130* 117* 136*   D-Dimer No results for input(s): DDIMER in the last 72 hours. Hgb A1c No results for input(s): HGBA1C in the last 72 hours. Lipid Profile No results for input(s): CHOL, HDL, LDLCALC, TRIG, CHOLHDL, LDLDIRECT in the last 72 hours. Thyroid function studies No results for input(s): TSH, T4TOTAL, T3FREE, THYROIDAB in the last 72 hours.  Invalid input(s): FREET3 Anemia work up No results for input(s): VITAMINB12, FOLATE, FERRITIN, TIBC, IRON, RETICCTPCT in the last 72 hours. Microbiology Recent Results (from the past 240 hour(s))  Blood Culture (routine x 2)     Status: None   Collection Time: 04/17/20  7:35 PM   Specimen: BLOOD  Result Value Ref Range Status    Specimen Description BLOOD RIGHT ANTECUBITAL  Final   Special Requests   Final    BOTTLES DRAWN AEROBIC AND ANAEROBIC Blood Culture adequate volume   Culture   Final    NO GROWTH 6 DAYS Performed at [facility], [address] [place], [place] [address]    Report Status 04/23/2020 FINAL  Final  Resp Panel by RT-PCR (Flu A&B, Covid) Nasopharyngeal Swab     Status: None   Collection Time: 04/17/20  9:33 PM   Specimen: Nasopharyngeal Swab; Nasopharyngeal(NP) swabs in vial transport medium  Result Value Ref Range Status   SARS Coronavirus 2 by RT PCR NEGATIVE NEGATIVE Final    Comment: (NOTE) SARS-CoV-2 target nucleic acids are NOT DETECTED.  The SARS-CoV-2 RNA is generally detectable in upper respiratory specimens during the acute phase of infection. The lowest concentration of SARS-CoV-2 viral copies this assay can detect is 138 copies/mL. A negative result does not preclude SARS-Cov-2 infection and should not be used as the sole basis for treatment or other patient management decisions. A negative result may occur with  improper specimen collection/handling, submission of specimen other than nasopharyngeal swab, presence of viral mutation(s) within the areas targeted by this assay, and inadequate number of viral copies(<138 copies/mL). A negative result must be combined with clinical observations, patient history, and epidemiological information. The expected result is Negative.  Fact Sheet for Patients:  EntrepreneurPulse.com.au  Fact Sheet for Healthcare Providers:  IncredibleEmployment.be  This test is no t yet approved or cleared by the [place] FDA and  has been authorized for detection and/or diagnosis of SARS-CoV-2 by FDA under an Emergency Use Authorization (EUA). This EUA will remain  in effect (meaning this test can be used) for the duration of the COVID-19 declaration under Section 564(b)(1) of the Act, 21 U.S.C.section  360bbb-3(b)(1), unless the authorization is terminated  or revoked sooner.       Influenza A by PCR NEGATIVE NEGATIVE Final   Influenza B by PCR NEGATIVE NEGATIVE Final    Comment: (NOTE) The Xpert Xpress SARS-CoV-2/FLU/RSV plus assay is intended as an aid in the diagnosis of influenza from Nasopharyngeal swab specimens and should not be used as a sole basis for treatment. Nasal washings and aspirates are unacceptable for Xpert Xpress SARS-CoV-2/FLU/RSV testing.  Fact Sheet for Patients: EntrepreneurPulse.com.au  Fact Sheet for Healthcare Providers: IncredibleEmployment.be  This  test is not yet approved or cleared by the [place] and has been authorized for detection and/or diagnosis of SARS-CoV-2 by FDA under an Emergency Use Authorization (EUA). This EUA will remain in effect (meaning this test can be used) for the duration of the COVID-19 declaration under Section 564(b)(1) of the Act, 21 U.S.C. section 360bbb-3(b)(1), unless the authorization is terminated or revoked.  Performed at [facility], [place] [address], [place], [place] [address]   Blood Culture (routine x 2)     Status: None   Collection Time: 04/17/20 10:07 PM   Specimen: BLOOD RIGHT FOREARM  Result Value Ref Range Status   Specimen Description BLOOD RIGHT FOREARM  Final   Special Requests   Final    BOTTLES DRAWN AEROBIC AND ANAEROBIC Blood Culture adequate volume   Culture   Final    NO GROWTH 6 DAYS Performed at [facility], [place] [address] [place], [place] [address]    Report Status 04/23/2020 FINAL  Final  MRSA PCR Screening     Status: None   Collection Time: 04/23/20  4:15 PM   Specimen: Nasopharyngeal  Result Value Ref Range Status   MRSA by PCR NEGATIVE NEGATIVE Final    Comment:        The GeneXpert MRSA Assay (FDA approved for NASAL specimens only), is one component of a comprehensive MRSA colonization surveillance program. It is  not intended to diagnose MRSA infection nor to guide or monitor treatment for MRSA infections. Performed at [facility], [place] [address] [place],  [address]       Studies:  DG ABD ACUTE 2+V W 1V CHEST  Result Date: 04/26/2020 CLINICAL DATA:  Abdominal pain EXAM: DG ABDOMEN ACUTE WITH 1 VIEW CHEST COMPARISON:  Chest radiograph April 22, 2020; CT abdomen April 22, 2020. FINDINGS: AP chest: Tracheostomy catheter tip is 5.3 cm above the carina. No pneumothorax. There are layering pleural effusions on each side with bibasilar atelectatic change. Apparent postoperative changes noted in the right base region. Heart is upper normal in size with pulmonary vascularity normal. No adenopathy. There is a healed fracture of the proximal left humerus with remodeling. Supine and upright abdomen: Gastrostomy catheter in region of stomach. There is no bowel dilatation or air-fluid level to suggest bowel obstruction. No free air. Vascular calcifications are noted in the pelvis. IMPRESSION: Gastrostomy catheter in region of stomach. No evident bowel obstruction or free air. Ill-defined opacity consistent with effusions and atelectasis in the lung bases. No frank consolidation. Stable cardiac silhouette. Electronically Signed   By: Lowella Grip III M.D.   On: 04/26/2020 10:08    Assessment: 55 y.o. male   1.  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 2.  Acute respiratory failure with hypoxia, status post tracheostomy 3.  Aspiration pneumonia 4.  AKI 5.  Moderate Malnutrition 6.  Alcohol and smoking abuse  Recommendations -I have personally reviewed his CT scan, lab results and chart.  He appears to have probably locally advanced laryngeal cancer, no significant cervical adenopathy or definitive distant metastasis on CT.  -I discussed the option of surgery (total laryngectomy and lymph node dissection, followed by adjuvant radiation),  versus concurrent chemoradiation.  I reviewed course of radiation and chemotherapy.  -Patient will be seen my partner Dr. Chryl Heck (she will be his primary oncologist) next Tuesday if he is still in hospital, or I will set up his new consult with her after  discharge. I will also inform radiation oncologist Dr. Isidore Moos to set up his outpatient follow-up.  We typically do concurrent chemoradiation in outpatient setting.  -will add patient to our ENT tumor conference next week  -I answered patient's and his family numbers questions to the best of my ability.  -other management per primary team. He would need home care on discharge.    Truitt Merle, MD 04/27/2020  2:38 PM

## 2020-04-27 NOTE — Progress Notes (Signed)
Patient ID: Mario Peters, male   DOB: 12-11-1965, 55 y.o.   MRN: [id number]  PROGRESS NOTE    Mario Peters  OXB:[id number] DOB: 12/04/1965 DOA: 04/17/2020 PCP: Patient, [name]   Brief Narrative:  55 year old male with history of alcohol and tobacco abuse presented with weakness, ongoing dysphagia and very poor oral intake for 4 months along with weight loss, hoarseness, cough and congestion.  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  ENT was consulted.  He underwent tracheostomy on 04/22/2020 along with direct laryngoscopy with biopsy.  He was briefly transferred to ICU but has been subsequently transferred out of ICU.  Assessment & Plan:   Severe sepsis: Present on admission Aspiration pneumonia Leukocytosis: Resolved -Sepsis has resolved.  Cultures negative so far. -Status post treatment with cefepime for 7 days.  Also received Zithromax for 5 days.  Diagnosis of squamous cell carcinoma of larynx Acute respiratory failure with hypoxia Severe dysphagia -Status post tracheostomy, direct laryngoscopy and biopsy and NG tube placement -Was transferred briefly to ICU but has been subsequently transferred out of ICU -Currently still on 8 L oxygen via tracheostomy.  Wean off as able.  Patient received IV Lasix 1 dose each for the last 2 days and has had good urine output.  We will give 1 more dose today. Lurline Idol care as per ENT.  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 -SLP following -Status post G-tube placement by IR on 04/24/2020.  Had complained of some abdominal pain on 04/26/2020: Abdominal x-ray was unremarkable.  Dietitian following and advancing tube feedings.  Acute kidney injury -Resolved with  hydration  Tobacco abuse -Counseled by prior hospitalist  Alcohol abuse Alcoholic liver disease -Ultrasound was negative for cirrhosis of liver  Hypoalbuminemia Severe protein calorie malnutrition -Follow nutrition recommendations  Hyponatremia -Mild.  Monitor  Hypomagnesemia -Improved  Hypophosphatemia -Possibly likely from refeeding syndrome.  Improved  DVT prophylaxis: Lovenox Code Status: Full Family Communication: Wife and sister present at bedside on 04/26/2020 Disposition Plan: Status is: Inpatient  Remains inpatient appropriate because:Inpatient level of care appropriate due to severity of illness   Dispo: The patient is from: Home              Anticipated d/c is to: Home              Patient currently is not medically stable to d/c.   Difficult to place patient No   Consultants: ENT/IR  Procedures: tracheostomy, direct laryngoscopy and biopsy   Antimicrobials:  Anti-infectives (From admission, onward)   Start     Dose/Rate Route Frequency Ordered Stop   04/24/20 1800  ceFAZolin (ANCEF) IVPB 2g/100 mL premix        2 g 200 mL/hr over 30 Minutes Intravenous  Once 04/24/20 1354 04/24/20 1657   04/24/20 1120  ceFAZolin (ANCEF) IVPB 2g/100 mL premix        2 g 200 mL/hr over 30 Minutes Intravenous  Once 04/24/20 1052 04/24/20 1050   04/18/20 1800  ceFEPIme (MAXIPIME) 2 g in sodium chloride 0.9 % 100 mL IVPB        2 g 200 mL/hr over 30 Minutes Intravenous Every 8 hours 04/18/20 1029 04/23/20 2359   04/18/20 1100  vancomycin (VANCOREADY) IVPB 750 mg/150 mL  Status:  Discontinued  750 mg 150 mL/hr over 60 Minutes Intravenous Every 12 hours 04/18/20 1029 04/18/20 1039   04/18/20 1000  ceFEPIme (MAXIPIME) 2 g in sodium chloride 0.9 % 100 mL IVPB  Status:  Discontinued        2 g 200 mL/hr over 30 Minutes Intravenous Every 12 hours 04/17/20 2153 04/18/20 1029   04/17/20 2300  azithromycin (ZITHROMAX) 500 mg in sodium chloride 0.9 % 250 mL IVPB  Status:   Discontinued        500 mg 250 mL/hr over 60 Minutes Intravenous Every 24 hours 04/17/20 2253 04/22/20 0950   04/17/20 2153  vancomycin variable dose per unstable renal function (pharmacist dosing)  Status:  Discontinued         Does not apply See admin instructions 04/17/20 2153 04/18/20 1039   04/17/20 1930  vancomycin (VANCOREADY) IVPB 1000 mg/200 mL        1,000 mg 200 mL/hr over 60 Minutes Intravenous  Once 04/17/20 1900 04/17/20 2323   04/17/20 1915  ceFEPIme (MAXIPIME) 2 g in sodium chloride 0.9 % 100 mL IVPB        2 g 200 mL/hr over 30 Minutes Intravenous  Once 04/17/20 1900 04/17/20 2101   04/17/20 1915  metroNIDAZOLE (FLAGYL) IVPB 500 mg        500 mg 100 mL/hr over 60 Minutes Intravenous  Once 04/17/20 1900 04/17/20 2209        Subjective: Patient seen and examined at bedside.  Poor historian.  Nods his head to some questions.  No overnight fever, vomiting reported; still complains of some intermittent abdominal pain.   Objective: Vitals:   04/27/20 0304 04/27/20 0434 04/27/20 0444 04/27/20 0500  BP:  112/78 116/68   Pulse: 74     Resp: 20     Temp:  98.6 F (37 C) 98.6 F (37 C)   TempSrc:  Axillary Oral   SpO2:  96%    Weight:    51.2 kg  Height:        Intake/Output Summary (Last 24 hours) at 04/27/2020 0755 Last data filed at 04/27/2020 0427 Gross per 24 hour  Intake 1092.84 ml  Output 850 ml  Net 242.84 ml   Filed Weights   04/17/20 2132 04/19/20 0736 04/27/20 0500  Weight: 65.8 kg 51.6 kg 51.2 kg    Examination:  General exam: No acute distress.  Looks chronically ill and very deconditioned and extremely thinly built.   ENT: Still on 8 L oxygen via tracheostomy  respiratory system: Decreased breath sounds at bases with scattered crackles cardiovascular system: Rate controlled, S1-S2 heard  gastrointestinal system: Abdomen is distended slightly, soft and mildly tender around G-tube site.  No rebound tenderness present.  G-tube present.  Normal bowel  sounds heard Extremities: Trace lower extremity edema present; no clubbing Central nervous system: Nods his head to some questions; alert.  Poor historian; no focal neurological deficits.  Moves extremities skin: No obvious ecchymosis/lesions  psychiatry: Affect is flat  Data Reviewed: I have personally reviewed following labs and imaging studies  CBC: Recent Labs  Lab 04/22/20 1539 04/23/20 0120 04/24/20 0305 04/25/20 0200 04/27/20 0258  WBC 13.2* 8.7 9.3 8.0 15.4*  NEUTROABS 12.3*  --   --  5.9 13.0*  HGB 12.5* 12.6* 11.4* 11.5* 13.1  HCT 37.9* 37.9* 34.7* 34.9* 38.5*  MCV 91.3 92.2 93.5 93.3 91.4  PLT 219 210 237 234 347   Basic Metabolic Panel: Recent Labs  Lab 04/22/20 1539 04/23/20 0120 04/24/20 0305  04/24/20 1751 04/24/20 1809 04/25/20 0200 04/26/20 0950 04/26/20 1626 04/27/20 0258  NA 134* 133* 135 134*  --   --   --   --  133*  K 3.4* 4.1 3.9 4.3  --   --   --   --  5.0  CL 87* 89* 97* 97*  --   --   --   --  98  CO2 37* 32 35* 33*  --   --   --   --  26  GLUCOSE 78 112* 123* 102*  --   --   --   --  116*  BUN 6 11 12 7   --   --   --   --  15  CREATININE 0.53* 0.56* 0.38* 0.39*  --   --   --   --  0.47*  CALCIUM 8.1* 7.9* 7.6* 7.7*  --   --   --   --  8.5*  MG 1.1* 1.5* 1.6*  --   --  1.4* 1.3* 2.1 2.0  PHOS 2.2* 2.2* <1.0*  --  3.0 3.0 3.4 3.3 3.7   GFR: Estimated Creatinine Clearance: 76.4 mL/min (A) (by C-G formula based on SCr of 0.47 mg/dL (L)). Liver Function Tests: Recent Labs  Lab 04/21/20 0025 04/22/20 1539 04/27/20 0258  AST 25 20 18   ALT 19 17 11   ALKPHOS 56 60 62  BILITOT 0.8 1.1 0.9  PROT 5.2* 5.4* 6.0*  ALBUMIN 1.8* 1.8* 2.2*   No results for input(s): LIPASE, AMYLASE in the last 168 hours. No results for input(s): AMMONIA in the last 168 hours. Coagulation Profile: No results for input(s): INR, PROTIME in the last 168 hours. Cardiac Enzymes: No results for input(s): CKTOTAL, CKMB, CKMBINDEX, TROPONINI in the last 168  hours. BNP (last 3 results) No results for input(s): PROBNP in the last 8760 hours. HbA1C: No results for input(s): HGBA1C in the last 72 hours. CBG: Recent Labs  Lab 04/26/20 1131 04/26/20 1613 04/26/20 2014 04/27/20 0022 04/27/20 0442  GLUCAP 109* 135* 121* 130* 130*   Lipid Profile: No results for input(s): CHOL, HDL, LDLCALC, TRIG, CHOLHDL, LDLDIRECT in the last 72 hours. Thyroid Function Tests: No results for input(s): TSH, T4TOTAL, FREET4, T3FREE, THYROIDAB in the last 72 hours. Anemia Panel: No results for input(s): VITAMINB12, FOLATE, FERRITIN, TIBC, IRON, RETICCTPCT in the last 72 hours. Sepsis Labs: No results for input(s): PROCALCITON, LATICACIDVEN in the last 168 hours.  Recent Results (from the past 240 hour(s))  Blood Culture (routine x 2)     Status: None   Collection Time: 04/17/20  7:35 PM   Specimen: BLOOD  Result Value Ref Range Status   Specimen Description BLOOD RIGHT ANTECUBITAL  Final   Special Requests   Final    BOTTLES DRAWN AEROBIC AND ANAEROBIC Blood Culture adequate volume   Culture   Final    NO GROWTH 6 DAYS Performed at [facility], [address] [place], [place] [address]    Report Status 04/23/2020 FINAL  Final  Resp Panel by RT-PCR (Flu A&B, Covid) Nasopharyngeal Swab     Status: None   Collection Time: 04/17/20  9:33 PM   Specimen: Nasopharyngeal Swab; Nasopharyngeal(NP) swabs in vial transport medium  Result Value Ref Range Status   SARS Coronavirus 2 by RT PCR NEGATIVE NEGATIVE Final    Comment: (NOTE) SARS-CoV-2 target nucleic acids are NOT DETECTED.  The SARS-CoV-2 RNA is generally detectable in upper respiratory specimens during the acute phase of  infection. The lowest concentration of SARS-CoV-2 viral copies this assay can detect is 138 copies/mL. A negative result does not preclude SARS-Cov-2 infection and should not be used as the sole basis for treatment or other patient management decisions. A negative result may  occur with  improper specimen collection/handling, submission of specimen other than nasopharyngeal swab, presence of viral mutation(s) within the areas targeted by this assay, and inadequate number of viral copies(<138 copies/mL). A negative result must be combined with clinical observations, patient history, and epidemiological information. The expected result is Negative.  Fact Sheet for Patients:  EntrepreneurPulse.com.au  Fact Sheet for Healthcare Providers:  IncredibleEmployment.be  This test is no t yet approved or cleared by the [place] FDA and  has been authorized for detection and/or diagnosis of SARS-CoV-2 by FDA under an Emergency Use Authorization (EUA). This EUA will remain  in effect (meaning this test can be used) for the duration of the COVID-19 declaration under Section 564(b)(1) of the Act, 21 U.S.C.section 360bbb-3(b)(1), unless the authorization is terminated  or revoked sooner.       Influenza A by PCR NEGATIVE NEGATIVE Final   Influenza B by PCR NEGATIVE NEGATIVE Final    Comment: (NOTE) The Xpert Xpress SARS-CoV-2/FLU/RSV plus assay is intended as an aid in the diagnosis of influenza from Nasopharyngeal swab specimens and should not be used as a sole basis for treatment. Nasal washings and aspirates are unacceptable for Xpert Xpress SARS-CoV-2/FLU/RSV testing.  Fact Sheet for Patients: EntrepreneurPulse.com.au  Fact Sheet for Healthcare Providers: IncredibleEmployment.be  This test is not yet approved or cleared by the [place] FDA and has been authorized for detection and/or diagnosis of SARS-CoV-2 by FDA under an Emergency Use Authorization (EUA). This EUA will remain in effect (meaning this test can be used) for the duration of the COVID-19 declaration under Section 564(b)(1) of the Act, 21 U.S.C. section 360bbb-3(b)(1), unless the authorization is terminated  or revoked.  Performed at [facility], [place] [address] [place], [place] [address]   Blood Culture (routine x 2)     Status: None   Collection Time: 04/17/20 10:07 PM   Specimen: BLOOD RIGHT FOREARM  Result Value Ref Range Status   Specimen Description BLOOD RIGHT FOREARM  Final   Special Requests   Final    BOTTLES DRAWN AEROBIC AND ANAEROBIC Blood Culture adequate volume   Culture   Final    NO GROWTH 6 DAYS Performed at [facility], [place] [address], [place], [place] [address]    Report Status 04/23/2020 FINAL  Final  MRSA PCR Screening     Status: None   Collection Time: 04/23/20  4:15 PM   Specimen: Nasopharyngeal  Result Value Ref Range Status   MRSA by PCR NEGATIVE NEGATIVE Final    Comment:        The GeneXpert MRSA Assay (FDA approved for NASAL specimens only), is one component of a comprehensive MRSA colonization surveillance program. It is not intended to diagnose MRSA infection nor to guide or monitor treatment for MRSA infections. Performed at [facility], [place] [address], [place], [place] [address]          Radiology Studies: DG ABD ACUTE 2+V W 1V CHEST  Result Date: 04/26/2020 CLINICAL DATA:  Abdominal pain EXAM: DG ABDOMEN ACUTE WITH 1 VIEW CHEST COMPARISON:  Chest radiograph April 22, 2020; CT abdomen April 22, 2020. FINDINGS: AP chest: Tracheostomy catheter tip is 5.3 cm above the carina. No pneumothorax. There  are layering pleural effusions on each side with bibasilar atelectatic change. Apparent postoperative changes noted in the right base region. Heart is upper normal in size with pulmonary vascularity normal. No adenopathy. There is a healed fracture of the proximal left humerus with remodeling. Supine and upright abdomen: Gastrostomy catheter in region of stomach. There is no bowel dilatation or air-fluid level to suggest bowel obstruction. No free air. Vascular calcifications are noted in the pelvis. IMPRESSION: Gastrostomy catheter  in region of stomach. No evident bowel obstruction or free air. Ill-defined opacity consistent with effusions and atelectasis in the lung bases. No frank consolidation. Stable cardiac silhouette. Electronically Signed   By: Lowella Grip III M.D.   On: 04/26/2020 10:08   DG Swallowing Func-Speech Pathology  Result Date: 04/25/2020 Objective Swallowing Evaluation: Type of Study: MBS-Modified Barium Swallow Study  Patient Details Name: Kavon Valenza MRN: [id number] Date of Birth: 14-Feb-1965 Today's Date: 04/25/2020 Time: SLP Start Time (ACUTE ONLY): 1100 -SLP Stop Time (ACUTE ONLY): 1130 SLP Time Calculation (min) (ACUTE ONLY): 30 min Past Medical History: Past Medical History: Diagnosis Date . Seizures ([place])  Past Surgical History: Past Surgical History: Procedure Laterality Date . DIRECT LARYNGOSCOPY N/A 04/22/2020  Procedure: DIRECT LARYNGOSCOPY WITH BIOPSY;  Surgeon: [name], MD;  Location: [place];  Service: ENT;  Laterality: N/A; . ESOPHAGOSCOPY  04/22/2020  Procedure: ESOPHAGOSCOPY;  Surgeon: [name], MD;  Location: [place];  Service: ENT;; . IR GASTROSTOMY TUBE MOD SED  04/24/2020 . TRACHEOSTOMY TUBE PLACEMENT N/A 04/22/2020  Procedure: TRACHEOSTOMY;  Surgeon: [name], MD;  Location: [facility] OR;  Service: ENT;  Laterality: N/A; HPI: Pt is a 55 y.o. male with medical history significant of EtOH abuse,  tobacco abuse, remote hx of seizures. He presented via EMS due to inability to get out of bed, weight loss, and productive cough x4 months and increased difficulty swallowing. CXR 3/30: Ill-defined left greater than right reticulonodular and ground-glass opacity, suspicious for bilateral pneumonia, potentially atypical or viral pneumonia. CT soft tissue: Mildly hyperdense focus at the posterior aspect of the left vocal fold with associated soft tissue thickening. 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 airway is partially compromised.Trach recommended.  Pt was to have trach placed on Friday 4/1 but it was cancelled, is now tentatively scheduled for [place] 4/4.  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  There were no recognizable structures.  It spilled over along the lateral pharyngeal wall on the right side.  The left piriform sinus appeared to be clear."  Subjective: -- (alert, talking with PMV placed intermittently) Assessment / Plan / Recommendation CHL IP CLINICAL IMPRESSIONS 04/25/2020 Clinical Impression Pt participated in brief MBS - PMV placed for one of only two boluses given due to the level of impairment and severe aspiration. He demonstrated a profound dysphagia with no ability to close the laryngeal vestibule.  There was marginal movement of the hyoid, no epiglottic inversion, no approximation of the arytenoids to epiglottic base, and no identifiable onset of the pharyngeal swallow.  One bolus each of nectar and thin liquid was observed to spill immediately into the larynx, eliciting a cough, and pt was able to move some of the bolus back into the  oral cavity for suctioning. What remained of the bolus was immediately aspirated, a portion of which was ejected from the trach tube.  Mr. Olveda watched the video in real time - we discussed his swallowing deficits and his inability to eat safely or comfortably at this time.  SLP will follow for further education.  Continue NPO with PEG for nutrition. SLP Visit Diagnosis Dysphagia, pharyngeal phase (R13.13) Attention and concentration deficit following -- Frontal lobe and executive function deficit following -- Impact on safety and function Severe aspiration risk   CHL IP  TREATMENT RECOMMENDATION 04/25/2020 Treatment Recommendations Therapy as outlined in treatment plan below   No flowsheet data found. CHL IP DIET RECOMMENDATION 04/25/2020 SLP Diet Recommendations NPO Liquid Administration via -- Medication Administration -- Compensations -- Postural Changes --   CHL IP OTHER RECOMMENDATIONS 04/25/2020 Recommended Consults -- Oral Care Recommendations Oral care QID Other Recommendations Have oral suction available   CHL IP FOLLOW UP RECOMMENDATIONS 04/25/2020 Follow up Recommendations Other (comment)   CHL IP FREQUENCY AND DURATION 04/25/2020 Speech Therapy Frequency (ACUTE ONLY) min 2x/week Treatment Duration 2 weeks      CHL IP ORAL PHASE 04/25/2020 Oral Phase WFL Oral - Pudding Teaspoon -- Oral - Pudding Cup -- Oral - Honey Teaspoon -- Oral - Honey Cup -- Oral - Nectar Teaspoon -- Oral - Nectar Cup -- Oral - Nectar Straw -- Oral - Thin Teaspoon -- Oral - Thin Cup -- Oral - Thin Straw -- Oral - Puree -- Oral - Mech Soft -- Oral - Regular -- Oral - Multi-Consistency -- Oral - Pill -- Oral Phase - Comment --  CHL IP PHARYNGEAL PHASE 04/25/2020 Pharyngeal Phase Impaired Pharyngeal- Pudding Teaspoon -- Pharyngeal -- Pharyngeal- Pudding Cup -- Pharyngeal -- Pharyngeal- Honey Teaspoon -- Pharyngeal -- Pharyngeal- Honey Cup -- Pharyngeal -- Pharyngeal- Nectar Teaspoon Reduced epiglottic inversion;Reduced anterior laryngeal mobility;Reduced laryngeal elevation;Reduced airway/laryngeal closure;Penetration/Aspiration before swallow;Significant aspiration (Amount);Pharyngeal residue - pyriform;Pharyngeal residue - valleculae Pharyngeal Material enters airway, passes BELOW cords and not ejected out despite cough attempt by patient Pharyngeal- Nectar Cup -- Pharyngeal -- Pharyngeal- Nectar Straw -- Pharyngeal -- Pharyngeal- Thin Teaspoon -- Pharyngeal -- Pharyngeal- Thin Cup -- Pharyngeal -- Pharyngeal- Thin Straw Reduced epiglottic inversion;Reduced anterior laryngeal mobility;Reduced laryngeal  elevation;Reduced airway/laryngeal closure;Penetration/Aspiration before swallow;Significant aspiration (Amount);Pharyngeal residue - pyriform;Pharyngeal residue - valleculae Pharyngeal Material enters airway, passes BELOW cords and not ejected out despite cough attempt by patient Pharyngeal- Puree -- Pharyngeal -- Pharyngeal- Mechanical Soft -- Pharyngeal -- Pharyngeal- Regular -- Pharyngeal -- Pharyngeal- Multi-consistency -- Pharyngeal -- Pharyngeal- Pill -- Pharyngeal -- Pharyngeal Comment --  No flowsheet data found. Juan Quam Laurice 04/25/2020, 11:50 AM                   Scheduled Meds: . chlorhexidine  15 mL Mouth Rinse BID  . Chlorhexidine Gluconate Cloth  6 each Topical Daily  . enoxaparin (LOVENOX) injection  40 mg Subcutaneous Daily  . feeding supplement (PROSource TF)  45 mL Per Tube BID  . mouth rinse  15 mL Mouth Rinse q12n4p  . nicotine  21 mg Transdermal Daily  . potassium & sodium phosphates  2 packet Per Tube TID WC & HS   Continuous Infusions: . feeding supplement (OSMOLITE 1.2 CAL) 40 mL/hr at 04/27/20 0233          Aline August, MD [facility] 04/27/2020, 7:55 AM

## 2020-04-28 DIAGNOSIS — J189 Pneumonia, unspecified organism: Secondary | ICD-10-CM | POA: Diagnosis not present

## 2020-04-28 DIAGNOSIS — E43 Unspecified severe protein-calorie malnutrition: Secondary | ICD-10-CM | POA: Diagnosis not present

## 2020-04-28 DIAGNOSIS — C329 Malignant neoplasm of larynx, unspecified: Secondary | ICD-10-CM | POA: Diagnosis not present

## 2020-04-28 DIAGNOSIS — N179 Acute kidney failure, unspecified: Secondary | ICD-10-CM | POA: Diagnosis not present

## 2020-04-28 LAB — GLUCOSE, CAPILLARY
Glucose-Capillary: 111 mg/dL — ABNORMAL HIGH (ref 70–99)
Glucose-Capillary: 116 mg/dL — ABNORMAL HIGH (ref 70–99)
Glucose-Capillary: 132 mg/dL — ABNORMAL HIGH (ref 70–99)
Glucose-Capillary: 138 mg/dL — ABNORMAL HIGH (ref 70–99)
Glucose-Capillary: 72 mg/dL (ref 70–99)
Glucose-Capillary: 92 mg/dL (ref 70–99)
Glucose-Capillary: 93 mg/dL (ref 70–99)

## 2020-04-28 LAB — CBC WITH DIFFERENTIAL/PLATELET
Abs Immature Granulocytes: 0.09 10*3/uL — ABNORMAL HIGH (ref 0.00–0.07)
Basophils Absolute: 0 10*3/uL (ref 0.0–0.1)
Basophils Relative: 0 %
Eosinophils Absolute: 0 10*3/uL (ref 0.0–0.5)
Eosinophils Relative: 0 %
HCT: 40.1 % (ref 39.0–52.0)
Hemoglobin: 13.2 g/dL (ref 13.0–17.0)
Immature Granulocytes: 1 %
Lymphocytes Relative: 8 %
Lymphs Abs: 1.2 10*3/uL (ref 0.7–4.0)
MCH: 30.2 pg (ref 26.0–34.0)
MCHC: 32.9 g/dL (ref 30.0–36.0)
MCV: 91.8 fL (ref 80.0–100.0)
Monocytes Absolute: 0.9 10*3/uL (ref 0.1–1.0)
Monocytes Relative: 6 %
Neutro Abs: 13.1 10*3/uL — ABNORMAL HIGH (ref 1.7–7.7)
Neutrophils Relative %: 85 %
Platelets: 284 10*3/uL (ref 150–400)
RBC: 4.37 MIL/uL (ref 4.22–5.81)
RDW: 12.1 % (ref 11.5–15.5)
WBC: 15.4 10*3/uL — ABNORMAL HIGH (ref 4.0–10.5)
nRBC: 0 % (ref 0.0–0.2)

## 2020-04-28 LAB — BASIC METABOLIC PANEL
Anion gap: 8 (ref 5–15)
BUN: 25 mg/dL — ABNORMAL HIGH (ref 6–20)
CO2: 27 mmol/L (ref 22–32)
Calcium: 8.7 mg/dL — ABNORMAL LOW (ref 8.9–10.3)
Chloride: 98 mmol/L (ref 98–111)
Creatinine, Ser: 0.49 mg/dL — ABNORMAL LOW (ref 0.61–1.24)
GFR, Estimated: >60 mL/min (ref >60)
Glucose, Bld: 118 mg/dL — ABNORMAL HIGH (ref 70–99)
Potassium: 3.9 mmol/L (ref 3.5–5.1)
Sodium: 133 mmol/L — ABNORMAL LOW (ref 135–145)

## 2020-04-28 LAB — MAGNESIUM: Magnesium: 1.7 mg/dL (ref 1.7–2.4)

## 2020-04-28 MED ORDER — FUROSEMIDE 10 MG/ML IJ SOLN
40.0000 mg | Freq: Once | INTRAMUSCULAR | Status: AC
  Administered 2020-04-28: 40 mg via INTRAVENOUS
  Filled 2020-04-28: qty 4

## 2020-04-28 NOTE — TOC Initial Note (Signed)
Transition of Care [facility]) - Initial/Assessment Note    Patient Details  Name: Mario Peters MRN: [id number] Date of Birth: 05/26/1965  Transition of Care [facility]) CM/SW Contact:    Carles Collet, RN Phone Number: 04/28/2020, 4:02 PM  Clinical Narrative:          Mario Peters w patient at bedside. He states that he will DC to his sister Mary's house. He is unable to provide address. Spoke w [name] over the phone. She confirms that patient will stay with her after DC and have 24 hour supervision. She states that she will be at the hospital Monday and available for trach teaching.  She states that she is working on disability coverage the patient. She had a PCP follow up at [facility] for him last week that was cancelled due to hospitalization. I instructed her to call Monday to get that rescheduled. We also discussed that he will likely be scheduled at the trach clinic for follow up care.   Patient will DC to: [place] [id number]  Currently patient on TC at 28%. Was changed to cuff less 6.0 flexible shiley on 4/8. He was speaking through a PMV with RT and I in the room.   Needs education- Trach care and PEG feeds. Family needs to be able to demonstrate care of trach and PEG prior to DC.  This needs to be preformed by nursing staff and RT at every available opportunity prior to DC. Trach team consult modified to STAT, for preporation for DC.   Needs for home: [facility] RN/RT PT OT, and orders. DME: order "For home use only DME Trach Supplies" to be filled out by ENT services, [employer] needs notified when order written. Tube feedings and pump. Orders are in place and [facility] has been notified of need for tube feeds.          Expected Discharge Plan: [place] Barriers to Discharge: Financial Resources,Inadequate or no [facility] will accept this patient,Continued Medical Work up   Patient Goals and CMS Choice Patient states their goals  for this hospitalization and ongoing recovery are:: to go home to sister's house      Expected Discharge Plan and Services Expected Discharge Plan: [place]   Discharge Planning Services: CM Consult Post Acute Care Choice: [place] Living arrangements for the past 2 months: Single Family Home                                      Prior Living Arrangements/Services Living arrangements for the past 2 months: Single Family Home Lives with:: Significant Other Patient language and need for interpreter reviewed:: Yes        Need for Family Participation in Patient Care: Yes (Comment) Care giver support system in place?: No (comment)   Criminal Activity/Legal Involvement Pertinent to Current Situation/Hospitalization: No - Comment as needed  Activities of Daily Living Home Assistive Devices/Equipment: None ADL Screening (condition at time of admission) Patient's cognitive ability adequate to safely complete daily activities?: Yes Is the patient deaf or have difficulty hearing?: No Does the patient have difficulty seeing, even when wearing glasses/contacts?: No Does the patient have difficulty concentrating, remembering, or making decisions?: No Patient able to express need for assistance with ADLs?: Yes Does the patient have difficulty dressing or bathing?: No Independently performs ADLs?: Yes (appropriate  for developmental age) Does the patient have difficulty walking or climbing stairs?: No Weakness of Legs: None Weakness of Arms/Hands: None  Permission Sought/Granted                  Emotional Assessment       Orientation: : Fluctuating Orientation (Suspected and/or reported Sundowners) Alcohol / Substance Use: Alcohol Use Psych Involvement: No (comment)  Admission diagnosis:  Hypokalemia [E87.6] Alcohol abuse [O53.66] 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 Laryngeal cancer ([place])   . Protein-calorie malnutrition, severe 04/20/2020  . Hypophosphatemia 04/19/2020  . Hypomagnesemia 04/19/2020  . AKI (acute kidney injury) ([place]) 04/18/2020  . Dehydration 04/18/2020  . CAP (community acquired pneumonia) 04/18/2020  . 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 Hyponatremia 04/18/2020  . Weight loss 04/18/2020  . Hypokalemia 04/18/2020  . Malnutrition ([place]) 04/18/2020  . Tobacco abuse 04/18/2020  . Sepsis ([place]) 04/17/2020   PCP:  Patient, [name] Pharmacy:   [facility] [place], [address] [place] [address] Phone: [phone] Fax: [phone]     Social Determinants of Health (SDOH) Interventions    Readmission Risk Interventions No flowsheet data found.

## 2020-04-28 NOTE — Progress Notes (Signed)
Patient ID: Mario Peters, male   DOB: Nov 12, 1965, 55 y.o.   MRN: [id number]  PROGRESS NOTE    Mario Peters  KGM:[id number] DOB: November 25, 1965 DOA: 04/17/2020 PCP: Patient, [name]   Brief Narrative:  55 year old male with history of alcohol and tobacco abuse presented with weakness, ongoing dysphagia and very poor oral intake for 4 months along with weight loss, hoarseness, cough and congestion.  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  ENT was consulted.  He underwent tracheostomy on 04/22/2020 along with direct laryngoscopy with biopsy.  He was briefly transferred to ICU but has been subsequently transferred out of ICU.  Assessment & Plan:   Severe sepsis: Present on admission Aspiration pneumonia Leukocytosis: Resolved -Sepsis has resolved.  Cultures negative so far. -Status post treatment with cefepime for 7 days.  Also received Zithromax for 5 days.  Diagnosis of squamous cell carcinoma of larynx Acute respiratory failure with hypoxia Severe dysphagia -Status post tracheostomy, direct laryngoscopy and biopsy and NG tube placement -Was transferred briefly to ICU but has been subsequently transferred out of ICU -Currently on 5 L oxygen via tracheostomy.  Wean off as able.  Patient has received intermittent IV Lasix over the last 3 days.  Might give 1 more dose of Lasix today. Lurline Idol care as per ENT.  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 -Evaluation by Dr. Feng/oncology appreciated: She will set up radiation oncology follow-up as well.  Outpatient follow-up with oncology/radiation oncology -SLP following -Status post G-tube placement by IR on 04/24/2020.  Had complained of some abdominal pain on 04/26/2020:  Abdominal x-ray was unremarkable.  Dietitian following and advancing tube feedings.  Acute kidney injury -Resolved with hydration  Tobacco abuse -Counseled by prior hospitalist  Alcohol abuse Alcoholic liver disease -Ultrasound was negative for cirrhosis of liver  Hypoalbuminemia Severe protein calorie malnutrition -Follow nutrition recommendations  Hyponatremia -Mild.  Monitor  Hypomagnesemia -Improved  Hypophosphatemia -Possibly likely from refeeding syndrome.  Improved  DVT prophylaxis: Lovenox Code Status: Full Family Communication: Wife and sister present at bedside on 04/26/2020 Disposition Plan: Status is: Inpatient  Remains inpatient appropriate because:Inpatient level of care appropriate due to severity of illness   Dispo: The patient is from: Home              Anticipated d/c is to: Home              Patient currently is not medically stable to d/c.   Difficult to place patient No   Consultants: ENT/IR  Procedures: tracheostomy, direct laryngoscopy and biopsy   Antimicrobials:  Anti-infectives (From admission, onward)   Start     Dose/Rate Route Frequency Ordered Stop   04/24/20 1800  ceFAZolin (ANCEF) IVPB 2g/100 mL premix        2 g 200 mL/hr over 30 Minutes Intravenous  Once 04/24/20 1354 04/24/20 1657   04/24/20 1120  ceFAZolin (ANCEF) IVPB 2g/100 mL premix        2 g 200 mL/hr over 30 Minutes Intravenous  Once 04/24/20 1052 04/24/20 1050   04/18/20 1800  ceFEPIme (MAXIPIME) 2 g in sodium chloride 0.9 % 100 mL IVPB        2 g 200 mL/hr over 30 Minutes Intravenous Every 8 hours 04/18/20 1029 04/23/20 2359   04/18/20  1100  vancomycin (VANCOREADY) IVPB 750 mg/150 mL  Status:  Discontinued        750 mg 150 mL/hr over 60 Minutes Intravenous Every 12 hours 04/18/20 1029 04/18/20 1039   04/18/20 1000  ceFEPIme (MAXIPIME) 2 g in sodium chloride 0.9 % 100 mL IVPB  Status:  Discontinued        2 g 200 mL/hr over 30 Minutes Intravenous Every 12 hours  04/17/20 2153 04/18/20 1029   04/17/20 2300  azithromycin (ZITHROMAX) 500 mg in sodium chloride 0.9 % 250 mL IVPB  Status:  Discontinued        500 mg 250 mL/hr over 60 Minutes Intravenous Every 24 hours 04/17/20 2253 04/22/20 0950   04/17/20 2153  vancomycin variable dose per unstable renal function (pharmacist dosing)  Status:  Discontinued         Does not apply See admin instructions 04/17/20 2153 04/18/20 1039   04/17/20 1930  vancomycin (VANCOREADY) IVPB 1000 mg/200 mL        1,000 mg 200 mL/hr over 60 Minutes Intravenous  Once 04/17/20 1900 04/17/20 2323   04/17/20 1915  ceFEPIme (MAXIPIME) 2 g in sodium chloride 0.9 % 100 mL IVPB        2 g 200 mL/hr over 30 Minutes Intravenous  Once 04/17/20 1900 04/17/20 2101   04/17/20 1915  metroNIDAZOLE (FLAGYL) IVPB 500 mg        500 mg 100 mL/hr over 60 Minutes Intravenous  Once 04/17/20 1900 04/17/20 2209        Subjective: Patient seen and examined at bedside.  Poor historian.  Still mildly short of breath with minimal exertion with intermittent cough.  Nods his head to some questions.  No overnight fever or vomiting reported.   Objective: Vitals:   04/28/20 0004 04/28/20 0443 04/28/20 0446 04/28/20 0731  BP: 120/81  110/69 112/68  Pulse: 94 78 81 82  Resp: 18 (!) 24 18 18   Temp: 98.5 F (36.9 C)  99.5 F (37.5 C) 98.4 F (36.9 C)  TempSrc: Oral  Oral Oral  SpO2: 95%  100% 100%  Weight:   50.6 kg   Height:        Intake/Output Summary (Last 24 hours) at 04/28/2020 0804 Last data filed at 04/28/2020 0300 Gross per 24 hour  Intake 473.33 ml  Output 250 ml  Net 223.33 ml   Filed Weights   04/19/20 0736 04/27/20 0500 04/28/20 0446  Weight: 51.6 kg 51.2 kg 50.6 kg    Examination:  General exam: Looks chronically ill and very deconditioned and extremely thinly built.  No distress ENT: Currently on 5 L oxygen via tracheostomy  respiratory system: Bilateral decreased breath sounds at bases with some crackles heard   cardiovascular system: S1-S2 heard, rate controlled  gastrointestinal system: Abdomen is distended slightly, soft and mildly tender around G-tube site.  No rebound tenderness present.  G-tube present.  Bowel sounds are heard extremities: No cyanosis; mild lower extremity edema present  Central nervous system: Awake; nurses aide to some questions.  Poor historian; no focal neurological deficits.  Moving extremities  skin: No obvious petechiae/rashes psychiatry: Flat affect  Data Reviewed: I have personally reviewed following labs and imaging studies  CBC: Recent Labs  Lab 04/22/20 1539 04/23/20 0120 04/24/20 0305 04/25/20 0200 04/27/20 0258 04/28/20 0109  WBC 13.2* 8.7 9.3 8.0 15.4* 15.4*  NEUTROABS 12.3*  --   --  5.9 13.0* 13.1*  HGB 12.5* 12.6* 11.4* 11.5* 13.1 13.2  HCT 37.9*  37.9* 34.7* 34.9* 38.5* 40.1  MCV 91.3 92.2 93.5 93.3 91.4 91.8  PLT 219 210 237 234 274 063   Basic Metabolic Panel: Recent Labs  Lab 04/23/20 0120 04/24/20 0305 04/24/20 1751 04/24/20 1809 04/25/20 0200 04/26/20 0950 04/26/20 1626 04/27/20 0258 04/28/20 0109  NA 133* 135 134*  --   --   --   --  133* 133*  K 4.1 3.9 4.3  --   --   --   --  5.0 3.9  CL 89* 97* 97*  --   --   --   --  98 98  CO2 32 35* 33*  --   --   --   --  26 27  GLUCOSE 112* 123* 102*  --   --   --   --  116* 118*  BUN 11 12 7   --   --   --   --  15 25*  CREATININE 0.56* 0.38* 0.39*  --   --   --   --  0.47* 0.49*  CALCIUM 7.9* 7.6* 7.7*  --   --   --   --  8.5* 8.7*  MG 1.5* 1.6*  --   --  1.4* 1.3* 2.1 2.0 1.7  PHOS 2.2* <1.0*  --  3.0 3.0 3.4 3.3 3.7  --    GFR: Estimated Creatinine Clearance: 75.5 mL/min (A) (by C-G formula based on SCr of 0.49 mg/dL (L)). Liver Function Tests: Recent Labs  Lab 04/22/20 1539 04/27/20 0258  AST 20 18  ALT 17 11  ALKPHOS 60 62  BILITOT 1.1 0.9  PROT 5.4* 6.0*  ALBUMIN 1.8* 2.2*   No results for input(s): LIPASE, AMYLASE in the last 168 hours. No results for input(s):  AMMONIA in the last 168 hours. Coagulation Profile: No results for input(s): INR, PROTIME in the last 168 hours. Cardiac Enzymes: No results for input(s): CKTOTAL, CKMB, CKMBINDEX, TROPONINI in the last 168 hours. BNP (last 3 results) No results for input(s): PROBNP in the last 8760 hours. HbA1C: No results for input(s): HGBA1C in the last 72 hours. CBG: Recent Labs  Lab 04/27/20 1610 04/27/20 1956 04/28/20 0001 04/28/20 0442 04/28/20 0734  GLUCAP 113* 121* 93 116* 72   Lipid Profile: No results for input(s): CHOL, HDL, LDLCALC, TRIG, CHOLHDL, LDLDIRECT in the last 72 hours. Thyroid Function Tests: No results for input(s): TSH, T4TOTAL, FREET4, T3FREE, THYROIDAB in the last 72 hours. Anemia Panel: No results for input(s): VITAMINB12, FOLATE, FERRITIN, TIBC, IRON, RETICCTPCT in the last 72 hours. Sepsis Labs: No results for input(s): PROCALCITON, LATICACIDVEN in the last 168 hours.  Recent Results (from the past 240 hour(s))  MRSA PCR Screening     Status: None   Collection Time: 04/23/20  4:15 PM   Specimen: Nasopharyngeal  Result Value Ref Range Status   MRSA by PCR NEGATIVE NEGATIVE Final    Comment:        The GeneXpert MRSA Assay (FDA approved for NASAL specimens only), is one component of a comprehensive MRSA colonization surveillance program. It is not intended to diagnose MRSA infection nor to guide or monitor treatment for MRSA infections. Performed at [facility], [place] [address] [place], [place] [address]          Radiology Studies: DG ABD ACUTE 2+V W 1V CHEST  Result Date: 04/26/2020 CLINICAL DATA:  Abdominal pain EXAM: DG ABDOMEN ACUTE WITH 1 VIEW CHEST COMPARISON:  Chest radiograph April 22, 2020; CT abdomen April 22, 2020. FINDINGS: AP chest: Tracheostomy catheter tip is 5.3 cm above the carina. No pneumothorax. There are layering pleural effusions on each side with bibasilar atelectatic change. Apparent postoperative changes noted in the right  base region. Heart is upper normal in size with pulmonary vascularity normal. No adenopathy. There is a healed fracture of the proximal left humerus with remodeling. Supine and upright abdomen: Gastrostomy catheter in region of stomach. There is no bowel dilatation or air-fluid level to suggest bowel obstruction. No free air. Vascular calcifications are noted in the pelvis. IMPRESSION: Gastrostomy catheter in region of stomach. No evident bowel obstruction or free air. Ill-defined opacity consistent with effusions and atelectasis in the lung bases. No frank consolidation. Stable cardiac silhouette. Electronically Signed   By: Lowella Grip III M.D.   On: 04/26/2020 10:08        Scheduled Meds: . chlorhexidine  15 mL Mouth Rinse BID  . Chlorhexidine Gluconate Cloth  6 each Topical Daily  . enoxaparin (LOVENOX) injection  40 mg Subcutaneous Daily  . feeding supplement (PROSource TF)  45 mL Per Tube BID  . mouth rinse  15 mL Mouth Rinse q12n4p  . nicotine  21 mg Transdermal Daily   Continuous Infusions: . feeding supplement (OSMOLITE 1.2 CAL) 1,000 mL (04/27/20 1617)          Aline August, MD [facility] 04/28/2020, 8:04 AM

## 2020-04-29 DIAGNOSIS — C329 Malignant neoplasm of larynx, unspecified: Secondary | ICD-10-CM | POA: Diagnosis not present

## 2020-04-29 DIAGNOSIS — E43 Unspecified severe protein-calorie malnutrition: Secondary | ICD-10-CM | POA: Diagnosis not present

## 2020-04-29 DIAGNOSIS — J189 Pneumonia, unspecified organism: Secondary | ICD-10-CM | POA: Diagnosis not present

## 2020-04-29 DIAGNOSIS — N179 Acute kidney failure, unspecified: Secondary | ICD-10-CM | POA: Diagnosis not present

## 2020-04-29 LAB — CBC WITH DIFFERENTIAL/PLATELET
Abs Immature Granulocytes: 0.06 10*3/uL (ref 0.00–0.07)
Basophils Absolute: 0.1 10*3/uL (ref 0.0–0.1)
Basophils Relative: 1 %
Eosinophils Absolute: 0.1 10*3/uL (ref 0.0–0.5)
Eosinophils Relative: 1 %
HCT: 39 % (ref 39.0–52.0)
Hemoglobin: 13 g/dL (ref 13.0–17.0)
Immature Granulocytes: 1 %
Lymphocytes Relative: 14 %
Lymphs Abs: 1.4 10*3/uL (ref 0.7–4.0)
MCH: 31.2 pg (ref 26.0–34.0)
MCHC: 33.3 g/dL (ref 30.0–36.0)
MCV: 93.5 fL (ref 80.0–100.0)
Monocytes Absolute: 0.7 10*3/uL (ref 0.1–1.0)
Monocytes Relative: 7 %
Neutro Abs: 8 10*3/uL — ABNORMAL HIGH (ref 1.7–7.7)
Neutrophils Relative %: 76 %
Platelets: 286 10*3/uL (ref 150–400)
RBC: 4.17 MIL/uL — ABNORMAL LOW (ref 4.22–5.81)
RDW: 12.5 % (ref 11.5–15.5)
WBC: 10.3 10*3/uL (ref 4.0–10.5)
nRBC: 0 % (ref 0.0–0.2)

## 2020-04-29 LAB — COMPREHENSIVE METABOLIC PANEL
ALT: 19 U/L (ref 0–44)
AST: 29 U/L (ref 15–41)
Albumin: 2.1 g/dL — ABNORMAL LOW (ref 3.5–5.0)
Alkaline Phosphatase: 58 U/L (ref 38–126)
Anion gap: 9 (ref 5–15)
BUN: 33 mg/dL — ABNORMAL HIGH (ref 6–20)
CO2: 28 mmol/L (ref 22–32)
Calcium: 8.8 mg/dL — ABNORMAL LOW (ref 8.9–10.3)
Chloride: 98 mmol/L (ref 98–111)
Creatinine, Ser: 0.5 mg/dL — ABNORMAL LOW (ref 0.61–1.24)
GFR, Estimated: >60 mL/min (ref >60)
Glucose, Bld: 162 mg/dL — ABNORMAL HIGH (ref 70–99)
Potassium: 4.2 mmol/L (ref 3.5–5.1)
Sodium: 135 mmol/L (ref 135–145)
Total Bilirubin: 0.5 mg/dL (ref 0.3–1.2)
Total Protein: 6.5 g/dL (ref 6.5–8.1)

## 2020-04-29 LAB — GLUCOSE, CAPILLARY
Glucose-Capillary: 91 mg/dL (ref 70–99)
Glucose-Capillary: 101 mg/dL — ABNORMAL HIGH (ref 70–99)
Glucose-Capillary: 106 mg/dL — ABNORMAL HIGH (ref 70–99)
Glucose-Capillary: 90 mg/dL (ref 70–99)
Glucose-Capillary: 66 mg/dL — ABNORMAL LOW (ref 70–99)
Glucose-Capillary: 66 mg/dL — ABNORMAL LOW (ref 70–99)
Glucose-Capillary: 97 mg/dL (ref 70–99)

## 2020-04-29 LAB — MAGNESIUM: Magnesium: 1.8 mg/dL (ref 1.7–2.4)

## 2020-04-29 MED ORDER — OSMOLITE 1.5 CAL PO LIQD
237.0000 mL | Freq: Four times a day (QID) | ORAL | Status: DC
  Administered 2020-04-29 (×2): 237 mL
  Filled 2020-04-29 (×5): qty 237

## 2020-04-29 NOTE — Progress Notes (Signed)
Occupational Therapy Treatment Patient Details Name: Mario Peters MRN: [id number] DOB: Jul 31, 1965 Today's Date: 04/29/2020    History of present illness Pt is an 55 y.o. male. Admitted 04/22/20 with difficulty with swallowing and weight loss (increased over the past 4 months) dehydration and hypotension as well as infection (pneumonia). 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  Tracheostomy tube (04/22/2020); Direct laryngoscopy (04/22/2020); and esophagoscopy (04/22/2020). PMH includes: Long history of smoking and ETOH, seizures, low health care literacy.   OT comments  Pt making steady progress towards OT goals this session. Session dovetailed with PT with pt completing functional mobility with MIN A with no AD although pt would benefit from RW as energy conservation strategy. Pts family present during session, education provided to family on how to assist pt with ADLs with education provided on energy conservation strategies for home. Pt likely to use 3n1 as seat in front of sink for wash up. Pt dressed this session but feel pt would needed at least set-up - [facility] for LB ADLs. Education provided on use of gait belt( placed up high under pts arms) when pt up and ambulating and using LRAD at home, education provided on RW vs rollator. Education provided on mobilizing at least once every hour at home. Pt would continue to benefit from skilled occupational therapy while admitted and after d/c to address the below listed limitations in order to improve overall functional mobility and facilitate independence with BADL participation. DC plan remains appropriate, will follow acutely per POC.   pt on 30% venturi mask 4L with SpO2 briefly dropping to 86% during mobility but increased quickly with standing rest break, HR 120s with mobility.   Follow Up Recommendations  Home health OT    Equipment Recommendations  3 in 1 bedside commode    Recommendations for Other Services      Precautions /  Restrictions Precautions Precautions: Fall Precaution Comments: trach Restrictions Weight Bearing Restrictions: No       Mobility Bed Mobility Overal bed mobility: Modified Independent                  Transfers Overall transfer level: Needs assistance Equipment used: None Transfers: Sit to/from Stand Sit to Stand: Supervision         General transfer comment: supervision for lines and safety    Balance Overall balance assessment: Needs assistance Sitting-balance support: No upper extremity supported;Feet supported Sitting balance-[name]: Good     Standing balance support: No upper extremity supported;During functional activity Standing balance-[name]: Fair                             ADL either performed or assessed with clinical judgement   ADL Overall ADL's : Needs assistance/impaired       Grooming Details (indicate cue type and reason): education provided on compensatory methods for grooming tasks as needed with pt and family verabalizing understanding                 Toilet Transfer: Minimal assistance;Ambulation         Tub/Shower Transfer Details (indicate cue type and reason): pt likely to complete wash up at sink initially Functional mobility during ADLs: Minimal assistance General ADL Comments: session focus on BADL education with family and increasing activity tolerance through functional mobility     Vision       Perception     Praxis      Cognition Arousal/Alertness: Awake/alert  Behavior During Therapy: WFL for tasks assessed/performed Overall Cognitive Status: Within Functional Limits for tasks assessed                                 General Comments: donned PMV with pt communicating needs effectively and expressing desire to DC home        Exercises     Shoulder Instructions       General Comments pt on 30% venturi mask 4L with SpO2 briefly dropping to 86% during mobility but  increased quickly with standing rest break, HR 120s with mobility. education provided to family on DME for home, having someone walk with pt, and energy conservation strategies    Pertinent Vitals/ Pain       Pain Assessment: Faces Faces Pain Scale: Hurts a little bit Pain Location: general discomfort with SpO2 probe pulling on pts eat Pain Descriptors / Indicators: Discomfort Pain Intervention(s): Monitored during session;Repositioned  Home Living                                          Prior Functioning/Environment              Frequency  Min 2X/week        Progress Toward Goals  OT Goals(current goals can now be found in the care plan section)  Progress towards OT goals: Progressing toward goals  Acute Rehab OT Goals Patient Stated Goal: go home OT Goal Formulation: With patient/family Time For Goal Achievement: 05/07/20 Potential to Achieve Goals: Good  Plan Discharge plan remains appropriate;Frequency remains appropriate    Co-evaluation                 AM-PAC OT "6 Clicks" Daily Activity     Outcome Measure   Help from another person eating meals?:  (NPO) Help from another person taking care of personal grooming?: A Little Help from another person toileting, which includes using toliet, bedpan, or urinal?: A Little Help from another person bathing (including washing, rinsing, drying)?: A Little Help from another person to put on and taking off regular upper body clothing?: None Help from another person to put on and taking off regular lower body clothing?: A Little 6 Click Score: 16    End of Session Equipment Utilized During Treatment: Gait belt;Oxygen;Other (comment) (30% venturi mask 4L)  OT Visit Diagnosis: Unsteadiness on feet (R26.81);Muscle weakness (generalized) (M62.81)   Activity Tolerance Patient tolerated treatment well   Patient Left in chair;with call bell/phone within reach;with family/visitor present   Nurse  Communication Mobility status        Time: 7616-0737 OT Time Calculation (min): 27 min  Charges: OT General Charges $OT Visit: 1 Visit OT Treatments $Self Care/Home Management : 23-37 mins  Harley Alto., COTA/L Acute Rehabilitation Services [phone]    Precious Haws 04/29/2020, 3:27 PM

## 2020-04-29 NOTE — Progress Notes (Signed)
I spoke with his Agnes Lawrence on the phone.  I came by to see him as well he looks very comfortable with tracheostomy.  He is breathing much better.  He has seen medical oncology and arrangements are being made for him to continue that as an outpatient with medical, radiation oncology as well as dental medicine.  We will present him at tumor board next week.  At this point he is very much interested in chemo/radiation as opposed to surgery.

## 2020-04-29 NOTE — TOC Progression Note (Addendum)
Transition of Care [facility]) - Progression Note    Patient Details  Name: Mario Peters MRN: [id number] Date of Birth: [birth year]  Transition of Care [facility]) CM/SW [place], [place] Phone Number: 04/29/2020, 9:44 AM  Clinical Narrative:     -CSW followed up with [facility]; they do not have RN staff available and cannot accept referral -CSW provided Referral information to [facility]; they do not service the [place] area -CSW contacted [place] from [place]; they do not have staffing to accept referral -CSW left message with Levada Dy at [facility] wait on response. Levada Dy returned call; they are not in network with wellcare medicaid - [place] is not in [place] does not have Conservation officer, historic buildings available and cannot accept referral - [facility] is not in network with pt's wellcare medicaid -[facility]  is not in network with pt's wellcare medicaid -[facility]  is not in network with pt's wellcare medicaid -[facility] cannot accept pt due to staffing   CSW will arrange trach supplies once DME order is in which needs to read: DME: order "For home use only DME [employer]" Orders requested through ENT provider assistant voicemail.   1210: Trach DME orders are in; CSW called [facility] and notified that orders are in.  1310: CSW received call from [place]. They are wanting clarification if pt will get feedings with pump or bolus.  [facility] also explained that there may be additional paperwork needed for Bolus Supplies  Expected Discharge Plan: [place] Barriers to Discharge: Financial Resources,Inadequate or no [place] will accept this patient,Continued Medical Work up  Expected Discharge Plan and Services Expected Discharge Plan: [place]   Discharge Planning Services: CM Consult Post Acute Care Choice: [place] arrangements for the past 2 months: Single Family Home                                        Social Determinants of Health (SDOH) Interventions    Readmission Risk Interventions No flowsheet data found.

## 2020-04-29 NOTE — TOC Progression Note (Signed)
Transition of Care [facility]) - Progression Note    Patient Details  Name: Mario Peters MRN: [id number] Date of Birth: 05-09-1965  Transition of Care [facility]) CM/SW [place], [place] Phone Number: 04/29/2020, 4:33 PM  Clinical Narrative:     CSW notified by [facility] that tube feeding order needed to be adjusted. [facility] emailed copy of correct order. CSW had MD sign order and emailed back to [employer]  CSW spoke with pt and pt wife and explained barriers to [facility] and that [facility] was unable to be arranged at this point. Barriers included medicaid, new trach care, and [place]ing.   Expected Discharge Plan: [place] Barriers to Discharge: Financial Resources,Inadequate or no [place] will accept this patient,Continued Medical Work up  Expected Discharge Plan and Services Expected Discharge Plan: [place]   Discharge Planning Services: CM Consult Post Acute Care Choice: [place] arrangements for the past 2 months: Single Family Home                                       Social Determinants of Health (SDOH) Interventions    Readmission Risk Interventions No flowsheet data found.

## 2020-04-29 NOTE — Progress Notes (Signed)
Physical Therapy Treatment Patient Details Name: Mario Peters MRN: [id number] DOB: [birth year] Today's Date: 04/29/2020    History of Present Illness Pt is an 55 y.o. male. Admitted 04/22/20 with difficulty with swallowing and weight loss (increased over the past 4 months) dehydration and hypotension as well as infection (pneumonia). 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  Tracheostomy tube (04/22/2020); Direct laryngoscopy (04/22/2020); and esophagoscopy (04/22/2020). PMH includes: Long history of smoking and ETOH, seizures, low health care literacy.    PT Comments    Pt admitted with above diagnosis. Pt was able to ambulate without RW but at times was unsteady therefore encouraged pt to use RW at home initially for safety and pt agrees. Pt desaturates on 4L - 30% trach collar to 87% briefly but rebounded quickly to >90%.  Discussed breathing and taking standing rest breaks.  Pt progressing well.  Pt currently with functional limitations due to balance and endurance deficits. Pt will benefit from skilled PT to increase their independence and safety with mobility to allow discharge to the venue listed below.     Follow Up Recommendations  Supervision for mobility/OOB;No PT follow up     Equipment Recommendations  Rolling walker with 5" wheels;3in1 (PT)    Recommendations for Other Services       Precautions / Restrictions Precautions Precautions: Fall Precaution Comments: trach Restrictions Weight Bearing Restrictions: No    Mobility  Bed Mobility Overal bed mobility: Modified Independent             General bed mobility comments: Declined OOB due to fatigue per pt    Transfers Overall transfer level: Needs assistance Equipment used: None Transfers: Sit to/from Stand Sit to Stand: Supervision         General transfer comment: supervision for lines and safety  Ambulation/Gait Ambulation/Gait assistance: Min guard;Min assist Gait Distance (Feet): 200  Feet Assistive device: None Gait Pattern/deviations: Step-through pattern;Wide base of support Gait velocity: slow and appropriate for situation Gait velocity interpretation: <1.31 ft/sec, indicative of household ambulator General Gait Details: shortened steps with slower pace (and wider BOS).  Pt at times was unsteady and lost balance when turning to walk back into room needing min assist.  Encouraged pt to use RW at home and family agrees. Wife and daughter in room today.   Stairs             Wheelchair Mobility    Modified Rankin (Stroke Patients Only)       Balance Overall balance assessment: Needs assistance Sitting-balance support: No upper extremity supported;Feet supported Sitting balance-[name]: Good     Standing balance support: No upper extremity supported;During functional activity Standing balance-[name]: Fair Standing balance comment: statically, can stand without UE support but dynamically needs UE support                            Cognition Arousal/Alertness: Awake/alert Behavior During Therapy: WFL for tasks assessed/performed Overall Cognitive Status: Within Functional Limits for tasks assessed                                 General Comments: donned PMV with pt communicating needs effectively and expressing desire to DC home      Exercises General Exercises - Upper Extremity Shoulder Flexion: AROM;Both;Supine;Theraband Theraband Level (Shoulder Flexion): Level 2 (Red) Elbow Flexion: AROM;Both;10 reps;Supine;Theraband Theraband Level (Elbow Flexion): Level 2 (Red) Elbow Extension:  AROM;Both;10 reps;Supine;Theraband Theraband Level (Elbow Extension): Level 2 (Red) General Exercises - Lower Extremity Ankle Circles/Pumps: AROM;Both;5 reps;Supine Heel Slides: AROM;Both;5 reps;Supine Straight Leg Raises: AROM;Both;5 reps;Supine    General Comments General comments (skin integrity, edema, etc.): pt on 30% venturi  mask 4L on medical air at rest, on 4LOxygen with mobility with SpO2 briefly dropping to 86% during mobility but increased quickly with standing rest break, HR 120s with mobility. education provided to family on DME for home, having someone walk with pt, and energy conservation strategies      Pertinent Vitals/Pain Pain Assessment: Faces Faces Pain Scale: Hurts a little bit Pain Location: general discomfort with SpO2 probe pulling on pts eat Pain Descriptors / Indicators: Discomfort Pain Intervention(s): Limited activity within patient's tolerance;Monitored during session;Repositioned    Home Living                      Prior Function            PT Goals (current goals can now be found in the care plan section) Acute Rehab PT Goals Patient Stated Goal: go home PT Goal Formulation: With patient Time For Goal Achievement: 05/07/20 Potential to Achieve Goals: Good Progress towards PT goals: Progressing toward goals    Frequency    Min 3X/week      PT Plan Current plan remains appropriate    Co-evaluation              AM-PAC PT "6 Clicks" Mobility   Outcome Measure  Help needed turning from your back to your side while in a flat bed without using bedrails?: None Help needed moving from lying on your back to sitting on the side of a flat bed without using bedrails?: None Help needed moving to and from a bed to a chair (including a wheelchair)?: A Little Help needed standing up from a chair using your arms (e.g., wheelchair or bedside chair)?: A Little Help needed to walk in hospital room?: A Little Help needed climbing 3-5 steps with a railing? : A Little 6 Click Score: 20    End of Session Equipment Utilized During Treatment: Gait belt;Oxygen (Retail banker ordered gait belt for pt and stated she would bring to pt when it comes up) Activity Tolerance: Patient limited by fatigue Patient left: with call bell/phone within reach;with family/visitor present;in  chair;with chair alarm set Nurse Communication: Mobility status PT Visit Diagnosis: Difficulty in walking, not elsewhere classified (R26.2)     Time: 1029-1040 PT Time Calculation (min) (ACUTE ONLY): 11 min  Charges:  $Gait Training: 8-22 mins                     Dreshaun Stene M,PT Acute Rehab Services [phone] (pager)   Alvira Philips 04/29/2020, 3:47 PM

## 2020-04-29 NOTE — Progress Notes (Signed)
Patient ID: Mario Peters, male   DOB: May 10, 1965, 55 y.o.   MRN: [id number]  PROGRESS NOTE    Mario Peters  RWE:[id number] DOB: Aug 04, 1965 DOA: 04/17/2020 PCP: Patient, [name]   Brief Narrative:  55 year old male with history of alcohol and tobacco abuse presented with weakness, ongoing dysphagia and very poor oral intake for 4 months along with weight loss, hoarseness, cough and congestion.  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  ENT was consulted.  He underwent tracheostomy on 04/22/2020 along with direct laryngoscopy with biopsy.  He was briefly transferred to ICU but has been subsequently transferred out of ICU.  Assessment & Plan:   Severe sepsis: Present on admission Aspiration pneumonia Leukocytosis: Resolved -Sepsis has resolved.  Cultures negative so far. -Status post treatment with cefepime for 7 days.  Also received Zithromax for 5 days.  Diagnosis of squamous cell carcinoma of larynx Acute respiratory failure with hypoxia Severe dysphagia -Status post tracheostomy, direct laryngoscopy and biopsy and NG tube placement -Was transferred briefly to ICU but has been subsequently transferred out of ICU -Currently on 5 L oxygen via tracheostomy.  Wean off as able.  Patient has received intermittent IV Lasix over the last several days.  Will not give Lasix today as blood pressure is on the lower side. -Trach care as per ENT.  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 -Evaluation by Dr. Feng/oncology appreciated: She will set up radiation oncology follow-up as well.  Outpatient follow-up with oncology/radiation oncology -SLP following -Status post G-tube placement by IR on 04/24/2020.  Had complained of some  abdominal pain on 04/26/2020: Abdominal x-ray was unremarkable.  Dietitian following and advancing tube feedings.  Acute kidney injury -Resolved with hydration  Tobacco abuse -Counseled by prior hospitalist  Alcohol abuse Alcoholic liver disease -Ultrasound was negative for cirrhosis of liver  Hypoalbuminemia Severe protein calorie malnutrition -Follow nutrition recommendations  Hyponatremia -Mild.  Improved  Hypomagnesemia -Improved  Hypophosphatemia -Possibly likely from refeeding syndrome.  Improved  DVT prophylaxis: Lovenox Code Status: Full Family Communication: Wife and sister present at bedside on 04/26/2020 Disposition Plan: Status is: Inpatient  Remains inpatient appropriate because:Inpatient level of care appropriate due to severity of illness   Dispo: The patient is from: Home              Anticipated d/c is to: Home once arrangements for tracheostomy care, supplemental oxygen, tube feeds and home health have been made              Patient currently is medically stable to d/c.   Difficult to place patient No   Consultants: ENT/IR  Procedures: tracheostomy, direct laryngoscopy and biopsy   Antimicrobials:  Anti-infectives (From admission, onward)   Start     Dose/Rate Route Frequency Ordered Stop   04/24/20 1800  ceFAZolin (ANCEF) IVPB 2g/100 mL premix        2 g 200 mL/hr over 30 Minutes Intravenous  Once 04/24/20 1354 04/24/20 1657   04/24/20 1120  ceFAZolin (ANCEF) IVPB 2g/100 mL premix        2 g 200 mL/hr over 30 Minutes Intravenous  Once 04/24/20 1052 04/24/20 1050   04/18/20 1800  ceFEPIme (MAXIPIME) 2 g in sodium chloride 0.9 % 100 mL IVPB  2 g 200 mL/hr over 30 Minutes Intravenous Every 8 hours 04/18/20 1029 04/23/20 2359   04/18/20 1100  vancomycin (VANCOREADY) IVPB 750 mg/150 mL  Status:  Discontinued        750 mg 150 mL/hr over 60 Minutes Intravenous Every 12 hours 04/18/20 1029 04/18/20 1039   04/18/20 1000  ceFEPIme (MAXIPIME) 2 g  in sodium chloride 0.9 % 100 mL IVPB  Status:  Discontinued        2 g 200 mL/hr over 30 Minutes Intravenous Every 12 hours 04/17/20 2153 04/18/20 1029   04/17/20 2300  azithromycin (ZITHROMAX) 500 mg in sodium chloride 0.9 % 250 mL IVPB  Status:  Discontinued        500 mg 250 mL/hr over 60 Minutes Intravenous Every 24 hours 04/17/20 2253 04/22/20 0950   04/17/20 2153  vancomycin variable dose per unstable renal function (pharmacist dosing)  Status:  Discontinued         Does not apply See admin instructions 04/17/20 2153 04/18/20 1039   04/17/20 1930  vancomycin (VANCOREADY) IVPB 1000 mg/200 mL        1,000 mg 200 mL/hr over 60 Minutes Intravenous  Once 04/17/20 1900 04/17/20 2323   04/17/20 1915  ceFEPIme (MAXIPIME) 2 g in sodium chloride 0.9 % 100 mL IVPB        2 g 200 mL/hr over 30 Minutes Intravenous  Once 04/17/20 1900 04/17/20 2101   04/17/20 1915  metroNIDAZOLE (FLAGYL) IVPB 500 mg        500 mg 100 mL/hr over 60 Minutes Intravenous  Once 04/17/20 1900 04/17/20 2209        Subjective: Patient seen and examined at bedside.  Poor historian.  No fever, vomiting, worsening shortness of breath reported.  Nods his head to some questions.  Indicates that he wants to go home objective: Vitals:   04/29/20 0328 04/29/20 0355 04/29/20 0456 04/29/20 0807  BP: 104/78   115/75  Pulse: 94   94  Resp: 18   20  Temp: 97.8 F (36.6 C)   97.7 F (36.5 C)  TempSrc: Axillary   Axillary  SpO2: 94% 100%  100%  Weight:   50.5 kg   Height:       No intake or output data in the 24 hours ending 04/29/20 0929 Filed Weights   04/27/20 0500 04/28/20 0446 04/29/20 0456  Weight: 51.2 kg 50.6 kg 50.5 kg    Examination:  General exam: Looks chronically ill and very deconditioned and extremely thinly built.  No acute distress  ENT: Still on 5 L oxygen via tracheostomy respiratory system: Decreased breath sounds at bases bilaterally with scattered crackles  cardiovascular system: Rate  controlled, S1-S2 heard gastrointestinal system: Abdomen is mildly distended, soft and mildly tender around G-tube site.  No rebound tenderness present.  G-tube present.  Normal bowel sounds heard  extremities: No edema or clubbing Central nervous system: Nods his head to some questions; awake.  Poor historian; no focal neurological deficits.  Moves extremities skin: No obvious ecchymosis/lesions psychiatry: Affect is flat  Data Reviewed: I have personally reviewed following labs and imaging studies  CBC: Recent Labs  Lab 04/22/20 1539 04/23/20 0120 04/24/20 0305 04/25/20 0200 04/27/20 0258 04/28/20 0109 04/29/20 0116  WBC 13.2*   < > 9.3 8.0 15.4* 15.4* 10.3  NEUTROABS 12.3*  --   --  5.9 13.0* 13.1* 8.0*  HGB 12.5*   < > 11.4* 11.5* 13.1 13.2 13.0  HCT 37.9*   < >  34.7* 34.9* 38.5* 40.1 39.0  MCV 91.3   < > 93.5 93.3 91.4 91.8 93.5  PLT 219   < > 237 234 274 284 286   < > = values in this interval not displayed.   Basic Metabolic Panel: Recent Labs  Lab 04/24/20 0305 04/24/20 1751 04/24/20 1809 04/25/20 0200 04/26/20 0950 04/26/20 1626 04/27/20 0258 04/28/20 0109 04/29/20 0116  NA 135 134*  --   --   --   --  133* 133* 135  K 3.9 4.3  --   --   --   --  5.0 3.9 4.2  CL 97* 97*  --   --   --   --  98 98 98  CO2 35* 33*  --   --   --   --  26 27 28   GLUCOSE 123* 102*  --   --   --   --  116* 118* 162*  BUN 12 7  --   --   --   --  15 25* 33*  CREATININE 0.38* 0.39*  --   --   --   --  0.47* 0.49* 0.50*  CALCIUM 7.6* 7.7*  --   --   --   --  8.5* 8.7* 8.8*  MG 1.6*  --   --  1.4* 1.3* 2.1 2.0 1.7 1.8  PHOS <1.0*  --  3.0 3.0 3.4 3.3 3.7  --   --    GFR: Estimated Creatinine Clearance: 75.4 mL/min (A) (by C-G formula based on SCr of 0.5 mg/dL (L)). Liver Function Tests: Recent Labs  Lab 04/22/20 1539 04/27/20 0258 04/29/20 0116  AST 20 18 29   ALT 17 11 19   ALKPHOS 60 62 58  BILITOT 1.1 0.9 0.5  PROT 5.4* 6.0* 6.5  ALBUMIN 1.8* 2.2* 2.1*   No results for  input(s): LIPASE, AMYLASE in the last 168 hours. No results for input(s): AMMONIA in the last 168 hours. Coagulation Profile: No results for input(s): INR, PROTIME in the last 168 hours. Cardiac Enzymes: No results for input(s): CKTOTAL, CKMB, CKMBINDEX, TROPONINI in the last 168 hours. BNP (last 3 results) No results for input(s): PROBNP in the last 8760 hours. HbA1C: No results for input(s): HGBA1C in the last 72 hours. CBG: Recent Labs  Lab 04/28/20 1547 04/28/20 2047 04/28/20 2352 04/29/20 0326 04/29/20 0855  GLUCAP 92 111* 138* 91 101*   Lipid Profile: No results for input(s): CHOL, HDL, LDLCALC, TRIG, CHOLHDL, LDLDIRECT in the last 72 hours. Thyroid Function Tests: No results for input(s): TSH, T4TOTAL, FREET4, T3FREE, THYROIDAB in the last 72 hours. Anemia Panel: No results for input(s): VITAMINB12, FOLATE, FERRITIN, TIBC, IRON, RETICCTPCT in the last 72 hours. Sepsis Labs: No results for input(s): PROCALCITON, LATICACIDVEN in the last 168 hours.  Recent Results (from the past 240 hour(s))  MRSA PCR Screening     Status: None   Collection Time: 04/23/20  4:15 PM   Specimen: Nasopharyngeal  Result Value Ref Range Status   MRSA by PCR NEGATIVE NEGATIVE Final    Comment:        The GeneXpert MRSA Assay (FDA approved for NASAL specimens only), is one component of a comprehensive MRSA colonization surveillance program. It is not intended to diagnose MRSA infection nor to guide or monitor treatment for MRSA infections. Performed at [facility], [place] [address], [place], [place] [address]          Radiology Studies: No results found.  Scheduled Meds: . chlorhexidine  15 mL Mouth Rinse BID  . Chlorhexidine Gluconate Cloth  6 each Topical Daily  . enoxaparin (LOVENOX) injection  40 mg Subcutaneous Daily  . feeding supplement (PROSource TF)  45 mL Per Tube BID  . mouth rinse  15 mL Mouth Rinse q12n4p  . nicotine  21 mg Transdermal Daily    Continuous Infusions: . feeding supplement (OSMOLITE 1.2 CAL) 1,000 mL (04/27/20 1617)          Aline August, MD [facility] 04/29/2020, 9:29 AM

## 2020-04-29 NOTE — Progress Notes (Signed)
Nutrition Follow-up  DOCUMENTATION CODES:   Severe malnutrition in context of acute illness/injury  INTERVENTION:   Tube Feeding via PEB:  Transition to bolus feedings Initiate 237 mL (1 carton) of Osmolite 1.5 QID starting today  TF Goal Regimen:  6 cans/cartons of Osmolite 1.5 per day Suggested feeding: Osmolite 1.5 360 mL (1.5 cans/cartons) QID 8:00, 12:00, 16:00, 20:00 Provides 2130 kcals, 90 g of protein  May need to adjust bowel regimen if continues without BM   NUTRITION DIAGNOSIS:   Severe Malnutrition related to acute illness,dysphagia (laryngeal mass) as evidenced by severe fat depletion,severe muscle depletion,per patient/family report.  Being addressed via TF   GOAL:   Patient will meet greater than or equal to 90% of their needs  Met via TF  MONITOR:   TF tolerance,Weight trends,Labs,I & O's  REASON FOR ASSESSMENT:   Consult Enteral/tube feeding initiation and management  ASSESSMENT:   55 yo male with a PMH of EtOH and tobacco abuse who presents with severe sepsis, AKI, severe dysphagia 2/2 laryngeal mass, and aspiration PNA.   Spoke with Education officer, museum regarding discharge plan. Pt is not discharging today  Pt is tolerating Osmolite 1.2 at 70 ml/hr via PEG tube.  Pt reports he is hungry; plan to transition to bolus feedings today. Hopefully, transition to bolus feeds were more closely mimic "normal eating pattern" which may hopefully alleviate his hunger. However, if he remains hungry, may need to consider increasing feeding regimen.   Noted plan for patient to receive outpatient oncology treatment; plan to forward notes to Oncology Outpatient RD so that pt may continue to be followed post discharge  Pt complaining of pain on his bottom; notified RN. Family noted a red spot on his bottom when they were helping him bath, wonder if patient may be developing a PI  +flatus, last documented BM on 4/7  Labs: reviewed Meds: reviewed   Diet Order:    Diet Order            Diet NPO time specified Except for: Sips with Meds  Diet effective midnight                 EDUCATION NEEDS:   Education needs have been addressed  Skin:  Skin Assessment: Reviewed RN Assessment  Last BM:  04/25/20  Height:   Ht Readings from Last 1 Encounters:  04/19/20 5' 7"  (1.702 m)    Weight:   Wt Readings from Last 1 Encounters:  04/29/20 50.5 kg    Ideal Body Weight:  67.3 kg  BMI:  Body mass index is 17.44 kg/m.  Estimated Nutritional Needs:   Kcal:  1950-2150  Protein:  90-115 g  Fluid:  >2 L   Kerman Passey MS, RDN, LDN, CNSC Registered Dietitian III Clinical Nutrition RD Pager and On-Call Pager Number Located in [place]

## 2020-04-30 ENCOUNTER — Encounter (HOSPITAL_COMMUNITY): Payer: Self-pay | Admitting: Internal Medicine

## 2020-04-30 DIAGNOSIS — C329 Malignant neoplasm of larynx, unspecified: Secondary | ICD-10-CM | POA: Diagnosis not present

## 2020-04-30 LAB — GLUCOSE, CAPILLARY
Glucose-Capillary: 135 mg/dL — ABNORMAL HIGH (ref 70–99)
Glucose-Capillary: 78 mg/dL (ref 70–99)
Glucose-Capillary: 82 mg/dL (ref 70–99)
Glucose-Capillary: 82 mg/dL (ref 70–99)
Glucose-Capillary: 93 mg/dL (ref 70–99)
Glucose-Capillary: 96 mg/dL (ref 70–99)

## 2020-04-30 MED ORDER — OSMOLITE 1.5 CAL PO LIQD
360.0000 mL | Freq: Four times a day (QID) | ORAL | Status: DC
  Administered 2020-04-30 – 2020-05-02 (×8): 360 mL
  Filled 2020-04-30 (×13): qty 474

## 2020-04-30 MED ORDER — POLYETHYLENE GLYCOL 3350 17 G PO PACK: 17.0000 g | PACK | Freq: Every day | ORAL | Status: DC | PRN

## 2020-04-30 MED ORDER — ALBUTEROL SULFATE (2.5 MG/3ML) 0.083% IN NEBU: 2.5000 mg | INHALATION_SOLUTION | Freq: Four times a day (QID) | RESPIRATORY_TRACT | Status: DC | PRN

## 2020-04-30 MED ORDER — SENNOSIDES-DOCUSATE SODIUM 8.6-50 MG PO TABS
1.0000 | ORAL_TABLET | Freq: Two times a day (BID) | ORAL | Status: DC
  Administered 2020-04-30 – 2020-05-02 (×5): 1
  Filled 2020-04-30 (×5): qty 1

## 2020-04-30 NOTE — Progress Notes (Signed)
Patient ID: Mario Peters, male   DOB: 22-May-1965, 55 y.o.   MRN: [id number]  PROGRESS NOTE    Yoshio Seliga  BDZ:[id number] DOB: [birth year] DOA: 04/17/2020 PCP: Patient, [name]   Brief Narrative:  55 year old male with history of alcohol and tobacco abuse presented with weakness, ongoing dysphagia and very poor oral intake for 4 months along with weight loss, hoarseness, cough and congestion.  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  ENT was consulted.  He underwent tracheostomy on 04/22/2020 along with direct laryngoscopy with biopsy.  He was briefly transferred to ICU but has been subsequently transferred out of ICU.  Assessment & Plan:   Severe sepsis: Present on admission Aspiration pneumonia Leukocytosis: Resolved -Sepsis has resolved.  Cultures negative so far. -Status post treatment with cefepime for 7 days.  Also received Zithromax for 5 days.  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  Wean off as able.  Patient has received intermittent IV Lasix over the last several days.  Will not give Lasix today as blood pressure is on the lower side. -Trach care as per ENT.  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 Feng/oncology appreciated: She will set up radiation oncology follow-up as well.  Outpatient follow-up with oncology/radiation oncology -Status post G-tube placement by IR on 04/24/2020.  Continue bolus tube feeds as per dietary recommendations  Acute kidney injury -Resolved with hydration  Tobacco abuse -Counseled by prior  hospitalist  Alcohol abuse Alcoholic liver disease -Ultrasound was negative for cirrhosis of liver  Hypoalbuminemia Severe protein calorie malnutrition -Follow nutrition recommendations  Hyponatremia -Mild.  Improved  Hypomagnesemia -Improved  Hypophosphatemia -Possibly likely from refeeding syndrome.  Improved  DVT prophylaxis: Lovenox Code Status: Full Family Communication: Wife and sister present at bedside on 04/26/2020 Disposition Plan: Status is: Inpatient  Remains inpatient appropriate because:Inpatient level of care appropriate due to severity of illness   Dispo: The patient is from: Home              Anticipated d/c is to: Home once arrangements for tracheostomy care, supplemental oxygen, tube feeds and home health have been made              Patient currently is medically stable to d/c.   Difficult to place patient No   Consultants: ENT/IR  Procedures: tracheostomy, direct laryngoscopy and biopsy   Antimicrobials:  Anti-infectives (From admission, onward)   Start     Dose/Rate Route Frequency Ordered Stop   04/24/20 1800  ceFAZolin (ANCEF) IVPB 2g/100 mL premix        2 g 200 mL/hr over 30 Minutes Intravenous  Once 04/24/20 1354 04/24/20 1657   04/24/20 1120  ceFAZolin (ANCEF) IVPB 2g/100 mL premix        2 g 200 mL/hr over 30 Minutes Intravenous  Once 04/24/20 1052 04/24/20 1050   04/18/20 1800  ceFEPIme (MAXIPIME) 2 g in sodium chloride 0.9 % 100 mL IVPB        2 g 200 mL/hr over 30 Minutes Intravenous Every 8 hours 04/18/20 1029 04/23/20 2359   04/18/20 1100  vancomycin (VANCOREADY) IVPB 750 mg/150 mL  Status:  Discontinued        750 mg 150 mL/hr over 60 Minutes Intravenous Every 12 hours 04/18/20 1029 04/18/20 1039   04/18/20 1000  ceFEPIme (MAXIPIME) 2 g in sodium chloride 0.9 % 100 mL IVPB  Status:  Discontinued        2 g 200 mL/hr over 30 Minutes Intravenous Every 12 hours 04/17/20 2153 04/18/20 1029   04/17/20 2300  azithromycin (ZITHROMAX)  500 mg in sodium chloride 0.9 % 250 mL IVPB  Status:  Discontinued        500 mg 250 mL/hr over 60 Minutes Intravenous Every 24 hours 04/17/20 2253 04/22/20 0950   04/17/20 2153  vancomycin variable dose per unstable renal function (pharmacist dosing)  Status:  Discontinued         Does not apply See admin instructions 04/17/20 2153 04/18/20 1039   04/17/20 1930  vancomycin (VANCOREADY) IVPB 1000 mg/200 mL        1,000 mg 200 mL/hr over 60 Minutes Intravenous  Once 04/17/20 1900 04/17/20 2323   04/17/20 1915  ceFEPIme (MAXIPIME) 2 g in sodium chloride 0.9 % 100 mL IVPB        2 g 200 mL/hr over 30 Minutes Intravenous  Once 04/17/20 1900 04/17/20 2101   04/17/20 1915  metroNIDAZOLE (FLAGYL) IVPB 500 mg        500 mg 100 mL/hr over 60 Minutes Intravenous  Once 04/17/20 1900 04/17/20 2209        Subjective: Patient seen and examined at bedside.  Poor historian.  No overnight vomiting or fever reported.  Nods his head to some questions.  objective: Vitals:   04/29/20 2333 04/30/20 0042 04/30/20 0348 04/30/20 0350  BP: (!) 94/52  123/76   Pulse: 75 77 60 78  Resp: 18 18 20 17   Temp: 97.8 F (36.6 C)  98 F (36.7 C)   TempSrc: Oral  Axillary   SpO2: 99% 98% 98% 97%  Weight:      Height:        Intake/Output Summary (Last 24 hours) at 04/30/2020 0800 Last data filed at 04/29/2020 2334 Gross per 24 hour  Intake --  Output 300 ml  Net -300 ml   Filed Weights   04/27/20 0500 04/28/20 0446 04/29/20 0456  Weight: 51.2 kg 50.6 kg 50.5 kg    Examination:  General exam: Looks chronically ill and very deconditioned and extremely thinly built.  No distress currently.   ENT: On 5 L oxygen via trach  respiratory system: Bilateral decreased breath sounds at bases with some crackles cardiovascular system: S1-S2 heard, rate controlled gastrointestinal system: Abdomen is distended slightly, soft and mildly tender around G-tube site.  No rebound tenderness present.  G-tube present.  Bowel  sounds are heard extremities: No clubbing or edema.   Central nervous system: Alert; nods his head to some questions.  Poor historian; no focal neurological deficits.  Moving extremities skin: No obvious petechiae/rashes psychiatry: Flat affect  Data Reviewed: I have personally reviewed following labs and imaging studies  CBC: Recent Labs  Lab 04/24/20 0305 04/25/20 0200 04/27/20 0258 04/28/20 0109 04/29/20 0116  WBC 9.3 8.0 15.4* 15.4* 10.3  NEUTROABS  --  5.9 13.0* 13.1* 8.0*  HGB 11.4* 11.5* 13.1 13.2 13.0  HCT 34.7* 34.9* 38.5* 40.1 39.0  MCV 93.5 93.3 91.4 91.8 93.5  PLT 237 234 274 284 793   Basic Metabolic Panel: Recent Labs  Lab 04/24/20 0305 04/24/20 1751 04/24/20 1809 04/25/20 0200 04/26/20 0950 04/26/20 1626  04/27/20 0258 04/28/20 0109 04/29/20 0116  NA 135 134*  --   --   --   --  133* 133* 135  K 3.9 4.3  --   --   --   --  5.0 3.9 4.2  CL 97* 97*  --   --   --   --  98 98 98  CO2 35* 33*  --   --   --   --  26 27 28   GLUCOSE 123* 102*  --   --   --   --  116* 118* 162*  BUN 12 7  --   --   --   --  15 25* 33*  CREATININE 0.38* 0.39*  --   --   --   --  0.47* 0.49* 0.50*  CALCIUM 7.6* 7.7*  --   --   --   --  8.5* 8.7* 8.8*  MG 1.6*  --   --  1.4* 1.3* 2.1 2.0 1.7 1.8  PHOS <1.0*  --  3.0 3.0 3.4 3.3 3.7  --   --    GFR: Estimated Creatinine Clearance: 75.4 mL/min (A) (by C-G formula based on SCr of 0.5 mg/dL (L)). Liver Function Tests: Recent Labs  Lab 04/27/20 0258 04/29/20 0116  AST 18 29  ALT 11 19  ALKPHOS 62 58  BILITOT 0.9 0.5  PROT 6.0* 6.5  ALBUMIN 2.2* 2.1*   No results for input(s): LIPASE, AMYLASE in the last 168 hours. No results for input(s): AMMONIA in the last 168 hours. Coagulation Profile: No results for input(s): INR, PROTIME in the last 168 hours. Cardiac Enzymes: No results for input(s): CKTOTAL, CKMB, CKMBINDEX, TROPONINI in the last 168 hours. BNP (last 3 results) No results for input(s): PROBNP in the last 8760  hours. HbA1C: No results for input(s): HGBA1C in the last 72 hours. CBG: Recent Labs  Lab 04/29/20 2039 04/29/20 2041 04/29/20 2329 04/30/20 0352 04/30/20 0757  GLUCAP 66* 66* 97 78 82   Lipid Profile: No results for input(s): CHOL, HDL, LDLCALC, TRIG, CHOLHDL, LDLDIRECT in the last 72 hours. Thyroid Function Tests: No results for input(s): TSH, T4TOTAL, FREET4, T3FREE, THYROIDAB in the last 72 hours. Anemia Panel: No results for input(s): VITAMINB12, FOLATE, FERRITIN, TIBC, IRON, RETICCTPCT in the last 72 hours. Sepsis Labs: No results for input(s): PROCALCITON, LATICACIDVEN in the last 168 hours.  Recent Results (from the past 240 hour(s))  MRSA PCR Screening     Status: None   Collection Time: 04/23/20  4:15 PM   Specimen: Nasopharyngeal  Result Value Ref Range Status   MRSA by PCR NEGATIVE NEGATIVE Final    Comment:        The GeneXpert MRSA Assay (FDA approved for NASAL specimens only), is one component of a comprehensive MRSA colonization surveillance program. It is not intended to diagnose MRSA infection nor to guide or monitor treatment for MRSA infections. Performed at [facility], [place] [address] [place], [place] [address]          Radiology Studies: No results found.      Scheduled Meds: . chlorhexidine  15 mL Mouth Rinse BID  . Chlorhexidine Gluconate Cloth  6 each Topical Daily  . enoxaparin (LOVENOX) injection  40 mg Subcutaneous Daily  . feeding supplement (OSMOLITE 1.5 CAL)  360 mL Per Tube QID  . mouth rinse  15 mL Mouth Rinse q12n4p  . nicotine  21 mg Transdermal Daily   Continuous Infusions:  Aline August, MD [facility] 04/30/2020, 8:00 AM

## 2020-04-30 NOTE — TOC Progression Note (Addendum)
Transition of Care [facility]) - Progression Note    Patient Details  Name: Mario Peters MRN: [id number] Date of Birth: January 09, 1966  Transition of Care [facility]) CM/SW Contact  Carles Collet, RN Phone Number: 04/30/2020, 2:06 PM  Clinical Narrative:    Per Medicare.gov HH agencies servicing zip code [phone] are:  [place] [facility]) [facility] ALL of these agencies have declined  [place]  ALL of these agencies also Declined.  [facility] WILL ACCEPT   Referral made to Rob Medlin with [facility] for private duty nursing. Authorization will take 5-14 days. Requested Respiratory Therapist through [employer] to see patient after DC. Jara w [employer] will check with trach team to see if this can be set up.    Discussed above w sister [name]. Emphasized to her that the family will be required to come to hospital for education on trach and PEG care and use. This will be mandatory prior to discharge as home health services cannot be established. [name] will be here tomorrow morning for education. LVM w Phillis Knack Respiratory Therapy manager. Notified bedside nurse via secure chat.     Expected Discharge Plan: [place] Barriers to Discharge: Financial Resources,Inadequate or no [place] will accept this patient,Continued Medical Work up  Expected Discharge Plan and Services Expected Discharge Plan: [place]   Discharge Planning Services: CM Consult Post Acute Care Choice: [place] arrangements for the past 2 months: Single Family Home                                       Social Determinants of Health (SDOH) Interventions    Readmission Risk Interventions No flowsheet data found.

## 2020-04-30 NOTE — Consult Note (Addendum)
[place]  [phone] [phone]   MEDICAL ONCOLOGY - INITIAL CONSULTATION  Referral MD: Dr. Aline August  Reason for Referral: Newly diagnosed laryngeal squamous cell carcinoma  HPI: Mario Peters is a 55 year old male with a past medical history significant for alcohol tobacco abuse, and history of seizures.  The patient presented with severe dysphagia, cough, fatigue.  He has had significant weight loss recently and hoarseness of his voice.  Symptoms present for approximately 4 months.  CT of the chest on admission showed diffuse bilateral pulmonary parenchymal changes consistent with multifocal pneumonia possible related to aspiration.  CT of the neck showed mildly hyperdense focus at the posterior aspect of the left vocal fold with associated soft tissue thickening which could be laryngeal neoplasm.  He was seen by ENT and flex fiberoptic lower discopathy was performed showing a large partially obstructing laryngeal tumor.  He was taken to the OR on 04/22/2020 underwent tracheostomy and biopsy.  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  P16 negative.  He is status post G-tube placement by IR on 04/24/2020.  The patient was seen in his hospital room today.  No family at the bedside.  He reports that he is feeling well overall.  He reports poor appetite due to difficulty swallowing and weight loss prior to admission.  He cannot quantify how much weight he has lost.  He currently denies headaches and dizziness.  He reports that his breathing has improved.  He denies dysphagia and odynophagia at this time.  Denies abdominal pain, nausea, vomiting, constipation, diarrhea.  The patient is hopeful to go home soon.  Case management working on discharge planning.  Medical Oncology was asked to the patient for recommendations regarding his newly diagnosed laryngeal squamous cell carcinoma   Past Medical History:  Diagnosis  Date  . Seizures ([place])   :  Past Surgical History:  Procedure Laterality Date  . DIRECT LARYNGOSCOPY N/A 04/22/2020   Procedure: DIRECT LARYNGOSCOPY WITH BIOPSY;  Surgeon: [name], MD;  Location: [facility];  Service: ENT;  Laterality: N/A;  . ESOPHAGOSCOPY  04/22/2020   Procedure: ESOPHAGOSCOPY;  Surgeon: [name], MD;  Location: [facility];  Service: ENT;;  . IR GASTROSTOMY TUBE MOD SED  04/24/2020  . TRACHEOSTOMY TUBE PLACEMENT N/A 04/22/2020   Procedure: TRACHEOSTOMY;  Surgeon: [name], MD;  Location: [facility];  Service: ENT;  Laterality: N/A;  :  Current Facility-Administered Medications  Medication Dose Route Frequency Provider Last Rate Last Admin  . acetaminophen (TYLENOL) tablet 650 mg  650 mg Oral Q6H PRN Toy Baker, MD   650 mg at 04/25/20 1147   Or  . acetaminophen (TYLENOL) suppository 650 mg  650 mg Rectal Q6H PRN Doutova, Anastassia, MD      . albuterol (PROVENTIL) (2.5 MG/3ML) 0.083% nebulizer solution 2.5 mg  2.5 mg Nebulization Q6H PRN Alekh, Kshitiz, MD      . chlorhexidine (PERIDEX) 0.12 % solution 15 mL  15 mL Mouth Rinse BID Candee Furbish, MD   15 mL at 04/30/20 0824  . Chlorhexidine Gluconate Cloth 2 % PADS 6 each  6 each Topical Daily Domenic Polite, MD   6 each at 04/30/20 0825  . enoxaparin (LOVENOX) injection 40 mg  40 mg Subcutaneous Daily Domenic Polite, MD   40 mg at 04/30/20 0824  . feeding supplement (OSMOLITE 1.5 CAL) liquid 360 mL  360 mL Per Tube QID Aline August, MD   360 mL at 04/30/20 0824  .  guaiFENesin-dextromethorphan (ROBITUSSIN DM) 100-10 MG/5ML syrup 10 mL  10 mL Per Tube Q4H PRN Aline August, MD   10 mL at 04/26/20 1252  . HYDROcodone-acetaminophen (NORCO/VICODIN) 5-325 MG per tablet 1-2 tablet  1-2 tablet Per Tube Q4H PRN Hammons, Theone Murdoch, RPH   2 tablet at 04/30/20 0824  . lip balm (CARMEX) ointment   Topical PRN Candee Furbish, MD      . LORazepam (ATIVAN) injection 0.5 mg  0.5 mg Intravenous QHS PRN Domenic Polite, MD   0.5  mg at 04/28/20 2126  . MEDLINE mouth rinse  15 mL Mouth Rinse q12n4p Candee Furbish, MD   15 mL at 04/29/20 1738  . morphine 2 MG/ML injection 1 mg  1 mg Intravenous Q4H PRN Aline August, MD   1 mg at 04/29/20 1548  . nicotine (NICODERM CQ - dosed in mg/24 hours) patch 21 mg  21 mg Transdermal Daily Doutova, Anastassia, MD   21 mg at 04/30/20 0830  . polyethylene glycol (MIRALAX / GLYCOLAX) packet 17 g  17 g Per Tube Daily PRN Alekh, Kshitiz, MD      . senna-docusate (Senokot-S) tablet 1 tablet  1 tablet Per Tube BID Aline August, MD         No Known Allergies:  Family History  Problem Relation Age of Onset  . Hypertension Other   :  Social History   Socioeconomic History  . Marital status: Single    Spouse name: Not on file  . Number of children: Not on file  . Years of education: Not on file  . Highest education level: Not on file  Occupational History  . Not on file  Tobacco Use  . Smoking status: Current Every Day Smoker  . Smokeless tobacco: Never Used  Vaping Use  . Vaping Use: Never used  Substance and Sexual Activity  . Alcohol use: Not Currently  . Drug use: Never  . Sexual activity: Not on file  Other Topics Concern  . Not on file  Social History Narrative  . Not on file   Social Determinants of Health   Financial Resource Strain: Not on file  Food Insecurity: Not on file  Transportation Needs: Not on file  Physical Activity: Not on file  Stress: Not on file  Social Connections: Not on file  Intimate Partner Violence: Not on file  :  Review of Systems: A comprehensive 14 point review of systems was negative except as noted in the HPI.  Exam: Patient Vitals for the past 24 hrs:  BP Temp Temp src Pulse Resp SpO2  04/30/20 1220 -- -- -- 74 18 98 %  04/30/20 1149 104/67 97.8 F (36.6 C) Oral 72 18 100 %  04/30/20 0847 -- -- -- 98 18 99 %  04/30/20 0800 122/65 98.7 F (37.1 C) Axillary 82 (!) 23 99 %  04/30/20 0350 -- -- -- 78 17 97 %  04/30/20  0348 123/76 98 F (36.7 C) Axillary 60 20 98 %  04/30/20 0042 -- -- -- 77 18 98 %  04/29/20 2333 (!) 94/52 97.8 F (36.6 C) Oral 75 18 99 %  04/29/20 2042 107/63 97.8 F (36.6 C) Oral 78 16 100 %  04/29/20 1900 -- -- -- 81 14 99 %  04/29/20 1651 109/74 -- -- 85 20 100 %  04/29/20 1519 -- -- -- 93 (!) 22 100 %    General: Awake and alert, no distress.   Eyes:  no scleral icterus.  ENT:  There were no oropharyngeal lesions.   Neck: Tracheostomy midline.   Lymphatics:  Negative cervical, supraclavicular or axillary adenopathy.   Respiratory: Diminished breath sounds at the bases. Cardiovascular:  Regular rate and rhythm, S1/S2, without murmur, rub or gallop.  There was no pedal edema.   GI:  abdomen was soft, flat, nontender, nondistended, without organomegaly, G-tube present.   Musculoskeletal:  no spinal tenderness of palpation of vertebral spine.   Skin exam was without echymosis, petichae.   Neuro exam was nonfocal. Patient was alert and oriented.  Attention was good.   Language was appropriate.  Mood was normal without depression.  Speech was not pressured.  Thought content was not tangential.     Lab Results  Component Value Date   WBC 10.3 04/29/2020   HGB 13.0 04/29/2020   HCT 39.0 04/29/2020   PLT 286 04/29/2020   GLUCOSE 162 (H) 04/29/2020   ALT 19 04/29/2020   AST 29 04/29/2020   NA 135 04/29/2020   K 4.2 04/29/2020   CL 98 04/29/2020   CREATININE 0.50 (L) 04/29/2020   BUN 33 (H) 04/29/2020   CO2 28 04/29/2020    CT ABDOMEN WO CONTRAST  Result Date: 04/23/2020 CLINICAL DATA:  Assess anatomy for gastrostomy tube placement EXAM: CT ABDOMEN WITHOUT CONTRAST TECHNIQUE: Multidetector CT imaging of the abdomen was performed following the standard protocol without IV contrast. COMPARISON:  CT chest 04/18/2020 FINDINGS: Lower chest: Trace bilateral pleural effusions. Bilateral lower lobe atelectasis with superimposed peripheral consolidation in a peribronchovascular  distribution. In the setting of diffuse lower lobe bronchial wall thickening, findings are concerning for aspiration. More mild peribronchovascular ground-glass attenuation opacities in the inferior aspect of the middle lobe and lingula. The visualized cardiac structures are normal in size. No pericardial effusion. Hepatobiliary: Normal hepatic contour and morphology. No discrete hepatic lesion. High density material present within the gallbladder lumen likely representing sludge and/or small stones. No biliary ductal dilatation. Pancreas: Unremarkable. No pancreatic ductal dilatation or surrounding inflammatory changes. Spleen: Normal in size without focal abnormality. Peripheral linear calcification along the splenic margin. Adrenals/Urinary Tract: Unremarkable adrenal glands and kidneys. No hydronephrosis or nephrolithiasis. Stomach/Bowel: Gastric tube is present. The tip of the gastric tube is in the proximal duodenum. Normal gastric anatomy. There is mild colonic interposition at the splenic flexure. Excellent visualization of the colon on the scout radiograph. Vascular/Lymphatic: Limited evaluation in the absence of intravenous contrast. Aortic atherosclerosis is present. No aneurysm. No suspicious lymphadenopathy. Other: No abdominal wall hernia or abnormality. Musculoskeletal: No acute fracture or aggressive appearing lytic or blastic osseous lesion. IMPRESSION: 1. Anatomy is suitable for percutaneous gastrostomy tube placement. 2. Probable aspiration changes in the bilateral lower lobes, and to a lesser extent in the inferior middle lobe and lingula. 3. Trace bilateral pleural effusions. 4. The tip of the gastric tube is in the proximal duodenum. 5. Sludge and/or small stones in the gallbladder. 6.  Aortic Atherosclerosis (ICD10-170.0). Electronically Signed   By: Jacqulynn Cadet M.D.   On: 04/23/2020 07:05   CT SOFT TISSUE NECK WO CONTRAST  Result Date: 04/18/2020 CLINICAL DATA:  Fatigue, cough and  weakness EXAM: CT NECK WITHOUT CONTRAST TECHNIQUE: Multidetector CT imaging of the neck was performed following the standard protocol without intravenous contrast. COMPARISON:  None. FINDINGS: Pharynx and larynx: There is a mildly hyperdense focus at the posterior aspect of the left vocal fold with associated soft tissue thickening (3:95). The epiglottis is normal. Pharynx is normal. Salivary glands: No inflammation, mass, or  stone. Thyroid: Normal Lymph nodes: None enlarged or abnormal density. Vascular: Negative. Limited intracranial: Negative. Visualized orbits: Negative. Mastoids and visualized paranasal sinuses: Mild left maxillary mucosal disease. Skeleton: No acute or aggressive process. Upper chest: Hazy opacities in the left apex. Mild emphysema. Other: None. IMPRESSION: 1. Mildly hyperdense focus at the posterior aspect of the left vocal fold with associated soft tissue thickening. 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 Correlation with direct visualization is recommended. 2. Hazy opacities in the left apex, which may indicate infection. Emphysema (ICD10-J43.9). Electronically Signed   By: Ulyses Jarred M.D.   On: 04/18/2020 00:28   CT CHEST WO CONTRAST  Result Date: 04/18/2020 CLINICAL DATA:  Follow-up abnormal chest x-ray EXAM: CT CHEST WITHOUT CONTRAST TECHNIQUE: Multidetector CT imaging of the chest was performed following the standard protocol without IV contrast. COMPARISON:  Chest x-ray from the previous day. FINDINGS: Cardiovascular: Somewhat limited due to lack of IV contrast. Mild atherosclerotic calcifications are noted in the thoracic aorta. No aneurysmal dilatation is noted. No cardiac enlargement is seen. Mild coronary calcifications are noted. The pulmonary artery as visualized appears within normal limits. Mediastinum/Nodes: Thoracic inlet shows some mild fullness in the region of the vocal cords better evaluated on recent CT of the neck. Thyroid is within normal limits. Scattered small  mediastinal lymph nodes are noted. The esophagus is within normal limits. Lungs/Pleura: Lungs are well aerated bilaterally with a combination of fine ground-glass opacities as well as more nodular tree-in-bud changes throughout the lower lobes left greater than right. These changes are consistent with diffuse pneumonia. There are worsened in the interval from the prior chest x-ray. Possibility of superimposed aspiration would deserve consideration as well as there is considerable inspissated material within the bronchial tree particularly in the left lower lobe. Upper Abdomen: Visualized upper abdomen shows no acute abnormality. Calcification along the margin of the spleen is noted. This may be related to prior trauma. Musculoskeletal: Degenerative changes of the thoracic spine are noted. IMPRESSION: Diffuse bilateral pulmonary parenchymal changes consistent with multifocal pneumonia. Possibility of superimposed aspiration deserves consideration as well given inspissated material within the lower lobe bronchial tree particularly on the left. Correlate clinically as to any immunocompromised state. Fullness in the region of the vocal cords incompletely evaluated on this exam. Aortic Atherosclerosis (ICD10-I70.0). Electronically Signed   By: Inez Catalina M.D.   On: 04/18/2020 00:20   IR GASTROSTOMY TUBE MOD SED  Result Date: 04/24/2020 INDICATION: History of head neck cancer. Please perform percutaneous gastrostomy tube placement for enteric nutrition supplementation purposes. EXAM: PULL TROUGH GASTROSTOMY TUBE PLACEMENT COMPARISON:  CT abdomen and pelvis-04/22/2020 MEDICATIONS: Ancef 2 gm IV; Antibiotics were administered within 1 hour of the procedure. Glucagon 1 mg IV CONTRAST:  20 mL of Omnipaque 300 administered into the gastric lumen. ANESTHESIA/SEDATION: Moderate (conscious) sedation was employed during this procedure. A total of Versed 1 mg and Fentanyl 50 mcg was administered intravenously. Moderate  Sedation Time: 35 minutes. The patient's level of consciousness and vital signs were monitored continuously by radiology nursing throughout the procedure under my direct supervision. FLUOROSCOPY TIME:  6 minutes (21 mGy) COMPLICATIONS: None immediate. PROCEDURE: Informed written consent was obtained from the patient following explanation of the procedure, risks, benefits and alternatives. A time out was performed prior to the initiation of the procedure. Ultrasound scanning was performed to demarcate the edge of the left lobe of the liver. Maximal barrier sterile technique utilized including caps, mask, sterile gowns, sterile gloves, large sterile drape, hand hygiene and Betadine  prep. The left upper quadrant was sterilely prepped and draped. An oral gastric catheter was inserted into the stomach under fluoroscopy. The existing nasogastric feeding tube was removed. The left costal margin and air opacified transverse colon were identified and avoided. Air was injected into the stomach for insufflation and visualization under fluoroscopy. Under sterile conditions a 17 gauge trocar needle was utilized to access the stomach percutaneously beneath the left subcostal margin after the overlying soft tissues were anesthetized with 1% Lidocaine with epinephrine. Needle position was confirmed within the stomach with aspiration of air and injection of small amount of contrast. A single T tack was deployed for gastropexy. Over an Amplatz guide wire, a 9-French sheath was inserted into the stomach. A snare device was utilized to capture the oral gastric catheter. The snare device was pulled retrograde from the stomach up the esophagus and out the oropharynx. The 20-French pull-through gastrostomy was connected to the snare device and pulled antegrade through the oropharynx down the esophagus into the stomach and then through the percutaneous tract external to the patient. The gastrostomy was assembled externally. Contrast  injection confirms position in the stomach. Several spot radiographic images were obtained in various obliquities for documentation. The patient tolerated procedure well without immediate post procedural complication. FINDINGS: After successful fluoroscopic guided placement, the gastrostomy tube is appropriately positioned with internal disc against the ventral aspect of the gastric lumen. IMPRESSION: Successful fluoroscopic insertion of a 20-French pull-through gastrostomy tube. The gastrostomy may be used immediately for medication administration and in 24 hrs for the initiation of feeds. Electronically Signed   By: Sandi Mariscal M.D.   On: 04/24/2020 12:17   DG Chest Port 1 View  Result Date: 04/22/2020 CLINICAL DATA:  55 year old male status post tracheostomy placement. EXAM: PORTABLE CHEST 1 VIEW COMPARISON:  Chest radiograph dated 04/17/2020 and CT dated 09/17/2020 FINDINGS: Tracheostomy with tip approximately 4.5 cm above the carina. Enteric tube extends below the diaphragm with tip beyond the margin of the image. Bilateral pulmonary nodularity primarily involving the lower lung fields in keeping with known pneumonia. No significant pleural effusion. No pneumothorax. No acute osseous pathology. Old healed fracture deformity of the left humeral neck. IMPRESSION: Tracheostomy above the carina. Electronically Signed   By: Anner Crete M.D.   On: 04/22/2020 17:47   DG Chest Port 1 View  Result Date: 04/17/2020 CLINICAL DATA:  Weight loss cough EXAM: PORTABLE CHEST 1 VIEW COMPARISON:  None. FINDINGS: Hyperinflation. Ill-defined left greater than right reticulonodular and ground-glass opacity. No pleural effusion. Normal heart size. No pneumothorax. IMPRESSION: Ill-defined left greater than right reticulonodular and ground-glass opacity, suspicious for bilateral pneumonia, potentially atypical or viral pneumonia. Electronically Signed   By: Donavan Foil M.D.   On: 04/17/2020 19:14   DG ABD ACUTE 2+V W  1V CHEST  Result Date: 04/26/2020 CLINICAL DATA:  Abdominal pain EXAM: DG ABDOMEN ACUTE WITH 1 VIEW CHEST COMPARISON:  Chest radiograph April 22, 2020; CT abdomen April 22, 2020. FINDINGS: AP chest: Tracheostomy catheter tip is 5.3 cm above the carina. No pneumothorax. There are layering pleural effusions on each side with bibasilar atelectatic change. Apparent postoperative changes noted in the right base region. Heart is upper normal in size with pulmonary vascularity normal. No adenopathy. There is a healed fracture of the proximal left humerus with remodeling. Supine and upright abdomen: Gastrostomy catheter in region of stomach. There is no bowel dilatation or air-fluid level to suggest bowel obstruction. No free air. Vascular calcifications are noted in the pelvis.  IMPRESSION: Gastrostomy catheter in region of stomach. No evident bowel obstruction or free air. Ill-defined opacity consistent with effusions and atelectasis in the lung bases. No frank consolidation. Stable cardiac silhouette. Electronically Signed   By: Lowella Grip III M.D.   On: 04/26/2020 10:08   DG Swallowing Func-Speech Pathology  Result Date: 04/25/2020 Objective Swallowing Evaluation: Type of Study: MBS-Modified Barium Swallow Study  Patient Details Name: Xxavier Noon MRN: [id number] Date of Birth: 03/21/1965 Today's Date: 04/25/2020 Time: SLP Start Time (ACUTE ONLY): 1100 -SLP Stop Time (ACUTE ONLY): 1130 SLP Time Calculation (min) (ACUTE ONLY): 30 min Past Medical History: Past Medical History: Diagnosis Date . Seizures ([place])  Past Surgical History: Past Surgical History: Procedure Laterality Date . DIRECT LARYNGOSCOPY N/A 04/22/2020  Procedure: DIRECT LARYNGOSCOPY WITH BIOPSY;  Surgeon: [name], MD;  Location: [facility];  Service: ENT;  Laterality: N/A; . ESOPHAGOSCOPY  04/22/2020  Procedure: ESOPHAGOSCOPY;  Surgeon: [name], MD;  Location: [facility];  Service: ENT;; . IR GASTROSTOMY TUBE MOD SED  04/24/2020 . TRACHEOSTOMY TUBE  PLACEMENT N/A 04/22/2020  Procedure: TRACHEOSTOMY;  Surgeon: [name], MD;  Location: [facility] OR;  Service: ENT;  Laterality: N/A; HPI: Pt is a 55 y.o. male with medical history significant of EtOH abuse,  tobacco abuse, remote hx of seizures. He presented via EMS due to inability to get out of bed, weight loss, and productive cough x4 months and increased difficulty swallowing. CXR 3/30: Ill-defined left greater than right reticulonodular and ground-glass opacity, suspicious for bilateral pneumonia, potentially atypical or viral pneumonia. CT soft tissue: Mildly hyperdense focus at the posterior aspect of the left vocal fold with associated soft tissue thickening. 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 airway is partially compromised.Trach recommended.  Pt was to have trach placed on Friday 4/1 but it was cancelled, is now tentatively scheduled for [place] 4/4.  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  There were no recognizable structures.  It spilled over along the lateral pharyngeal wall on the right side.  The left piriform sinus appeared to be clear."  Subjective: -- (alert, talking with PMV placed intermittently) Assessment / Plan / Recommendation CHL IP CLINICAL IMPRESSIONS 04/25/2020 Clinical Impression Pt participated in brief MBS - PMV placed for one of only two boluses given due to the level of impairment and severe aspiration. He demonstrated a profound dysphagia with no ability to close the laryngeal vestibule.  There was marginal movement of the hyoid, no epiglottic inversion, no approximation of the arytenoids to epiglottic  base, and no identifiable onset of the pharyngeal swallow.  One bolus each of nectar and thin liquid was observed to spill immediately into the larynx, eliciting a cough, and pt was able to move some of the bolus back into the oral cavity for suctioning. What remained of the bolus was immediately aspirated, a portion of which was ejected from the trach tube.  Mr. Thomann watched the video in real time - we discussed his swallowing deficits and his inability to eat safely or comfortably at this time.  SLP will follow for further education.  Continue NPO with PEG for nutrition. SLP Visit Diagnosis Dysphagia, pharyngeal phase (R13.13) Attention and concentration deficit following -- Frontal  lobe and executive function deficit following -- Impact on safety and function Severe aspiration risk   CHL IP TREATMENT RECOMMENDATION 04/25/2020 Treatment Recommendations Therapy as outlined in treatment plan below   No flowsheet data found. CHL IP DIET RECOMMENDATION 04/25/2020 SLP Diet Recommendations NPO Liquid Administration via -- Medication Administration -- Compensations -- Postural Changes --   CHL IP OTHER RECOMMENDATIONS 04/25/2020 Recommended Consults -- Oral Care Recommendations Oral care QID Other Recommendations Have oral suction available   CHL IP FOLLOW UP RECOMMENDATIONS 04/25/2020 Follow up Recommendations Other (comment)   CHL IP FREQUENCY AND DURATION 04/25/2020 Speech Therapy Frequency (ACUTE ONLY) min 2x/week Treatment Duration 2 weeks      CHL IP ORAL PHASE 04/25/2020 Oral Phase WFL Oral - Pudding Teaspoon -- Oral - Pudding Cup -- Oral - Honey Teaspoon -- Oral - Honey Cup -- Oral - Nectar Teaspoon -- Oral - Nectar Cup -- Oral - Nectar Straw -- Oral - Thin Teaspoon -- Oral - Thin Cup -- Oral - Thin Straw -- Oral - Puree -- Oral - Mech Soft -- Oral - Regular -- Oral - Multi-Consistency -- Oral - Pill -- Oral Phase - Comment --  CHL IP PHARYNGEAL PHASE 04/25/2020 Pharyngeal Phase Impaired Pharyngeal- Pudding Teaspoon --  Pharyngeal -- Pharyngeal- Pudding Cup -- Pharyngeal -- Pharyngeal- Honey Teaspoon -- Pharyngeal -- Pharyngeal- Honey Cup -- Pharyngeal -- Pharyngeal- Nectar Teaspoon Reduced epiglottic inversion;Reduced anterior laryngeal mobility;Reduced laryngeal elevation;Reduced airway/laryngeal closure;Penetration/Aspiration before swallow;Significant aspiration (Amount);Pharyngeal residue - pyriform;Pharyngeal residue - valleculae Pharyngeal Material enters airway, passes BELOW cords and not ejected out despite cough attempt by patient Pharyngeal- Nectar Cup -- Pharyngeal -- Pharyngeal- Nectar Straw -- Pharyngeal -- Pharyngeal- Thin Teaspoon -- Pharyngeal -- Pharyngeal- Thin Cup -- Pharyngeal -- Pharyngeal- Thin Straw Reduced epiglottic inversion;Reduced anterior laryngeal mobility;Reduced laryngeal elevation;Reduced airway/laryngeal closure;Penetration/Aspiration before swallow;Significant aspiration (Amount);Pharyngeal residue - pyriform;Pharyngeal residue - valleculae Pharyngeal Material enters airway, passes BELOW cords and not ejected out despite cough attempt by patient Pharyngeal- Puree -- Pharyngeal -- Pharyngeal- Mechanical Soft -- Pharyngeal -- Pharyngeal- Regular -- Pharyngeal -- Pharyngeal- Multi-consistency -- Pharyngeal -- Pharyngeal- Pill -- Pharyngeal -- Pharyngeal Comment --  No flowsheet data found. Juan Quam Laurice 04/25/2020, 11:50 AM              US Abdomen Limited RUQ (LIVER/GB)  Result Date: 04/18/2020 CLINICAL DATA:  Alcohol abuse. EXAM: ULTRASOUND ABDOMEN LIMITED RIGHT UPPER QUADRANT COMPARISON:  None. FINDINGS: Gallbladder: Gallbladder sludge fills the majority of the lumen of the gallbladder. No gallstones or wall thickening visualized. No pericholecystic fluid. No sonographic Murphy sign noted by sonographer. Common bile duct: Diameter: 3 mm. Liver: No focal lesion identified. Within normal limits in parenchymal echogenicity. Portal vein is patent on color Doppler imaging with normal  direction of blood flow towards the liver. Other: None. IMPRESSION: Gallbladder sludge fills the majority of the lumen of the gallbladder. No associate findings of acute cholecystitis or choledocholithiasis. Electronically Signed   By: Iven Finn M.D.   On: 04/18/2020 02:16     CT ABDOMEN WO CONTRAST  Result Date: 04/23/2020 CLINICAL DATA:  Assess anatomy for gastrostomy tube placement EXAM: CT ABDOMEN WITHOUT CONTRAST TECHNIQUE: Multidetector CT imaging of the abdomen was performed following the standard protocol without IV contrast. COMPARISON:  CT chest 04/18/2020 FINDINGS: Lower chest: Trace bilateral pleural effusions. Bilateral lower lobe atelectasis with superimposed peripheral consolidation in a peribronchovascular distribution. In the setting of diffuse lower lobe bronchial wall thickening, findings are concerning for aspiration. More mild peribronchovascular ground-glass attenuation  opacities in the inferior aspect of the middle lobe and lingula. The visualized cardiac structures are normal in size. No pericardial effusion. Hepatobiliary: Normal hepatic contour and morphology. No discrete hepatic lesion. High density material present within the gallbladder lumen likely representing sludge and/or small stones. No biliary ductal dilatation. Pancreas: Unremarkable. No pancreatic ductal dilatation or surrounding inflammatory changes. Spleen: Normal in size without focal abnormality. Peripheral linear calcification along the splenic margin. Adrenals/Urinary Tract: Unremarkable adrenal glands and kidneys. No hydronephrosis or nephrolithiasis. Stomach/Bowel: Gastric tube is present. The tip of the gastric tube is in the proximal duodenum. Normal gastric anatomy. There is mild colonic interposition at the splenic flexure. Excellent visualization of the colon on the scout radiograph. Vascular/Lymphatic: Limited evaluation in the absence of intravenous contrast. Aortic atherosclerosis is present. No  aneurysm. No suspicious lymphadenopathy. Other: No abdominal wall hernia or abnormality. Musculoskeletal: No acute fracture or aggressive appearing lytic or blastic osseous lesion. IMPRESSION: 1. Anatomy is suitable for percutaneous gastrostomy tube placement. 2. Probable aspiration changes in the bilateral lower lobes, and to a lesser extent in the inferior middle lobe and lingula. 3. Trace bilateral pleural effusions. 4. The tip of the gastric tube is in the proximal duodenum. 5. Sludge and/or small stones in the gallbladder. 6.  Aortic Atherosclerosis (ICD10-170.0). Electronically Signed   By: Jacqulynn Cadet M.D.   On: 04/23/2020 07:05   CT SOFT TISSUE NECK WO CONTRAST  Result Date: 04/18/2020 CLINICAL DATA:  Fatigue, cough and weakness EXAM: CT NECK WITHOUT CONTRAST TECHNIQUE: Multidetector CT imaging of the neck was performed following the standard protocol without intravenous contrast. COMPARISON:  None. FINDINGS: Pharynx and larynx: There is a mildly hyperdense focus at the posterior aspect of the left vocal fold with associated soft tissue thickening (3:95). The epiglottis is normal. Pharynx is normal. Salivary glands: No inflammation, mass, or stone. Thyroid: Normal Lymph nodes: None enlarged or abnormal density. Vascular: Negative. Limited intracranial: Negative. Visualized orbits: Negative. Mastoids and visualized paranasal sinuses: Mild left maxillary mucosal disease. Skeleton: No acute or aggressive process. Upper chest: Hazy opacities in the left apex. Mild emphysema. Other: None. IMPRESSION: 1. Mildly hyperdense focus at the posterior aspect of the left vocal fold with associated soft tissue thickening. 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 Correlation with direct visualization is recommended. 2. Hazy opacities in the left apex, which may indicate infection. Emphysema (ICD10-J43.9). Electronically Signed   By: Ulyses Jarred M.D.   On: 04/18/2020 00:28   CT CHEST WO CONTRAST  Result Date:  04/18/2020 CLINICAL DATA:  Follow-up abnormal chest x-ray EXAM: CT CHEST WITHOUT CONTRAST TECHNIQUE: Multidetector CT imaging of the chest was performed following the standard protocol without IV contrast. COMPARISON:  Chest x-ray from the previous day. FINDINGS: Cardiovascular: Somewhat limited due to lack of IV contrast. Mild atherosclerotic calcifications are noted in the thoracic aorta. No aneurysmal dilatation is noted. No cardiac enlargement is seen. Mild coronary calcifications are noted. The pulmonary artery as visualized appears within normal limits. Mediastinum/Nodes: Thoracic inlet shows some mild fullness in the region of the vocal cords better evaluated on recent CT of the neck. Thyroid is within normal limits. Scattered small mediastinal lymph nodes are noted. The esophagus is within normal limits. Lungs/Pleura: Lungs are well aerated bilaterally with a combination of fine ground-glass opacities as well as more nodular tree-in-bud changes throughout the lower lobes left greater than right. These changes are consistent with diffuse pneumonia. There are worsened in the interval from the prior chest x-ray. Possibility of superimposed aspiration  would deserve consideration as well as there is considerable inspissated material within the bronchial tree particularly in the left lower lobe. Upper Abdomen: Visualized upper abdomen shows no acute abnormality. Calcification along the margin of the spleen is noted. This may be related to prior trauma. Musculoskeletal: Degenerative changes of the thoracic spine are noted. IMPRESSION: Diffuse bilateral pulmonary parenchymal changes consistent with multifocal pneumonia. Possibility of superimposed aspiration deserves consideration as well given inspissated material within the lower lobe bronchial tree particularly on the left. Correlate clinically as to any immunocompromised state. Fullness in the region of the vocal cords incompletely evaluated on this exam. Aortic  Atherosclerosis (ICD10-I70.0). Electronically Signed   By: Inez Catalina M.D.   On: 04/18/2020 00:20   IR GASTROSTOMY TUBE MOD SED  Result Date: 04/24/2020 INDICATION: History of head neck cancer. Please perform percutaneous gastrostomy tube placement for enteric nutrition supplementation purposes. EXAM: PULL TROUGH GASTROSTOMY TUBE PLACEMENT COMPARISON:  CT abdomen and pelvis-04/22/2020 MEDICATIONS: Ancef 2 gm IV; Antibiotics were administered within 1 hour of the procedure. Glucagon 1 mg IV CONTRAST:  20 mL of Omnipaque 300 administered into the gastric lumen. ANESTHESIA/SEDATION: Moderate (conscious) sedation was employed during this procedure. A total of Versed 1 mg and Fentanyl 50 mcg was administered intravenously. Moderate Sedation Time: 35 minutes. The patient's level of consciousness and vital signs were monitored continuously by radiology nursing throughout the procedure under my direct supervision. FLUOROSCOPY TIME:  6 minutes (21 mGy) COMPLICATIONS: None immediate. PROCEDURE: Informed written consent was obtained from the patient following explanation of the procedure, risks, benefits and alternatives. A time out was performed prior to the initiation of the procedure. Ultrasound scanning was performed to demarcate the edge of the left lobe of the liver. Maximal barrier sterile technique utilized including caps, mask, sterile gowns, sterile gloves, large sterile drape, hand hygiene and Betadine prep. The left upper quadrant was sterilely prepped and draped. An oral gastric catheter was inserted into the stomach under fluoroscopy. The existing nasogastric feeding tube was removed. The left costal margin and air opacified transverse colon were identified and avoided. Air was injected into the stomach for insufflation and visualization under fluoroscopy. Under sterile conditions a 17 gauge trocar needle was utilized to access the stomach percutaneously beneath the left subcostal margin after the overlying  soft tissues were anesthetized with 1% Lidocaine with epinephrine. Needle position was confirmed within the stomach with aspiration of air and injection of small amount of contrast. A single T tack was deployed for gastropexy. Over an Amplatz guide wire, a 9-French sheath was inserted into the stomach. A snare device was utilized to capture the oral gastric catheter. The snare device was pulled retrograde from the stomach up the esophagus and out the oropharynx. The 20-French pull-through gastrostomy was connected to the snare device and pulled antegrade through the oropharynx down the esophagus into the stomach and then through the percutaneous tract external to the patient. The gastrostomy was assembled externally. Contrast injection confirms position in the stomach. Several spot radiographic images were obtained in various obliquities for documentation. The patient tolerated procedure well without immediate post procedural complication. FINDINGS: After successful fluoroscopic guided placement, the gastrostomy tube is appropriately positioned with internal disc against the ventral aspect of the gastric lumen. IMPRESSION: Successful fluoroscopic insertion of a 20-French pull-through gastrostomy tube. The gastrostomy may be used immediately for medication administration and in 24 hrs for the initiation of feeds. Electronically Signed   By: Sandi Mariscal M.D.   On: 04/24/2020 12:17  DG Chest Port 1 View  Result Date: 04/22/2020 CLINICAL DATA:  55 year old male status post tracheostomy placement. EXAM: PORTABLE CHEST 1 VIEW COMPARISON:  Chest radiograph dated 04/17/2020 and CT dated 09/17/2020 FINDINGS: Tracheostomy with tip approximately 4.5 cm above the carina. Enteric tube extends below the diaphragm with tip beyond the margin of the image. Bilateral pulmonary nodularity primarily involving the lower lung fields in keeping with known pneumonia. No significant pleural effusion. No pneumothorax. No acute osseous  pathology. Old healed fracture deformity of the left humeral neck. IMPRESSION: Tracheostomy above the carina. Electronically Signed   By: Anner Crete M.D.   On: 04/22/2020 17:47   DG Chest Port 1 View  Result Date: 04/17/2020 CLINICAL DATA:  Weight loss cough EXAM: PORTABLE CHEST 1 VIEW COMPARISON:  None. FINDINGS: Hyperinflation. Ill-defined left greater than right reticulonodular and ground-glass opacity. No pleural effusion. Normal heart size. No pneumothorax. IMPRESSION: Ill-defined left greater than right reticulonodular and ground-glass opacity, suspicious for bilateral pneumonia, potentially atypical or viral pneumonia. Electronically Signed   By: Donavan Foil M.D.   On: 04/17/2020 19:14   DG ABD ACUTE 2+V W 1V CHEST  Result Date: 04/26/2020 CLINICAL DATA:  Abdominal pain EXAM: DG ABDOMEN ACUTE WITH 1 VIEW CHEST COMPARISON:  Chest radiograph April 22, 2020; CT abdomen April 22, 2020. FINDINGS: AP chest: Tracheostomy catheter tip is 5.3 cm above the carina. No pneumothorax. There are layering pleural effusions on each side with bibasilar atelectatic change. Apparent postoperative changes noted in the right base region. Heart is upper normal in size with pulmonary vascularity normal. No adenopathy. There is a healed fracture of the proximal left humerus with remodeling. Supine and upright abdomen: Gastrostomy catheter in region of stomach. There is no bowel dilatation or air-fluid level to suggest bowel obstruction. No free air. Vascular calcifications are noted in the pelvis. IMPRESSION: Gastrostomy catheter in region of stomach. No evident bowel obstruction or free air. Ill-defined opacity consistent with effusions and atelectasis in the lung bases. No frank consolidation. Stable cardiac silhouette. Electronically Signed   By: Lowella Grip III M.D.   On: 04/26/2020 10:08   DG Swallowing Func-Speech Pathology  Result Date: 04/25/2020 Objective Swallowing Evaluation: Type of Study:  MBS-Modified Barium Swallow Study  Patient Details Name: Klever Twyford MRN: [id number] Date of Birth: 05-29-1965 Today's Date: 04/25/2020 Time: SLP Start Time (ACUTE ONLY): 1100 -SLP Stop Time (ACUTE ONLY): 1130 SLP Time Calculation (min) (ACUTE ONLY): 30 min Past Medical History: Past Medical History: Diagnosis Date . Seizures ([place])  Past Surgical History: Past Surgical History: Procedure Laterality Date . DIRECT LARYNGOSCOPY N/A 04/22/2020  Procedure: DIRECT LARYNGOSCOPY WITH BIOPSY;  Surgeon: [name], MD;  Location: [facility];  Service: ENT;  Laterality: N/A; . ESOPHAGOSCOPY  04/22/2020  Procedure: ESOPHAGOSCOPY;  Surgeon: [name], MD;  Location: [facility];  Service: ENT;; . IR GASTROSTOMY TUBE MOD SED  04/24/2020 . TRACHEOSTOMY TUBE PLACEMENT N/A 04/22/2020  Procedure: TRACHEOSTOMY;  Surgeon: [name], MD;  Location: [facility] OR;  Service: ENT;  Laterality: N/A; HPI: Pt is a 55 y.o. male with medical history significant of EtOH abuse,  tobacco abuse, remote hx of seizures. He presented via EMS due to inability to get out of bed, weight loss, and productive cough x4 months and increased difficulty swallowing. CXR 3/30: Ill-defined left greater than right reticulonodular and ground-glass opacity, suspicious for bilateral pneumonia, potentially atypical or viral pneumonia. CT soft tissue: Mildly hyperdense focus at the posterior aspect of the left vocal fold with associated soft tissue  thickening. 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 airway is partially compromised.Trach recommended.  Pt was to have trach placed on Friday 4/1 but it was cancelled, is now tentatively scheduled for [place] 4/4.  Swallow evaluation ordered and notes indicate pt overtly coughing while consuming potassium today.Trach placed 4/4,  laryngoscopy report showed "There is a large mass completely obliterating the endolarynx.  There were no recognizable structures.  It spilled over along the lateral pharyngeal wall on the right side.  The left piriform sinus appeared to be clear."  Subjective: -- (alert, talking with PMV placed intermittently) Assessment / Plan / Recommendation CHL IP CLINICAL IMPRESSIONS 04/25/2020 Clinical Impression Pt participated in brief MBS - PMV placed for one of only two boluses given due to the level of impairment and severe aspiration. He demonstrated a profound dysphagia with no ability to close the laryngeal vestibule.  There was marginal movement of the hyoid, no epiglottic inversion, no approximation of the arytenoids to epiglottic base, and no identifiable onset of the pharyngeal swallow.  One bolus each of nectar and thin liquid was observed to spill immediately into the larynx, eliciting a cough, and pt was able to move some of the bolus back into the oral cavity for suctioning. What remained of the bolus was immediately aspirated, a portion of which was ejected from the trach tube.  Mr. Eden watched the video in real time - we discussed his swallowing deficits and his inability to eat safely or comfortably at this time.  SLP will follow for further education.  Continue NPO with PEG for nutrition. SLP Visit Diagnosis Dysphagia, pharyngeal phase (R13.13) Attention and concentration deficit following -- Frontal lobe and executive function deficit following -- Impact on safety and function Severe aspiration risk   CHL IP TREATMENT RECOMMENDATION 04/25/2020 Treatment Recommendations Therapy as outlined in treatment plan below   No flowsheet data found. CHL IP DIET RECOMMENDATION 04/25/2020 SLP Diet Recommendations NPO Liquid Administration via -- Medication Administration -- Compensations -- Postural Changes --   CHL IP OTHER RECOMMENDATIONS 04/25/2020 Recommended Consults -- Oral Care Recommendations Oral care QID Other  Recommendations Have oral suction available   CHL IP FOLLOW UP RECOMMENDATIONS 04/25/2020 Follow up Recommendations Other (comment)   CHL IP FREQUENCY AND DURATION 04/25/2020 Speech Therapy Frequency (ACUTE ONLY) min 2x/week Treatment Duration 2 weeks      CHL IP ORAL PHASE 04/25/2020 Oral Phase WFL Oral - Pudding Teaspoon -- Oral - Pudding Cup -- Oral - Honey Teaspoon -- Oral - Honey Cup -- Oral - Nectar Teaspoon -- Oral - Nectar Cup -- Oral - Nectar Straw -- Oral - Thin Teaspoon -- Oral - Thin Cup -- Oral - Thin Straw -- Oral - Puree -- Oral - Mech Soft -- Oral - Regular -- Oral - Multi-Consistency -- Oral - Pill -- Oral Phase - Comment --  CHL IP PHARYNGEAL PHASE 04/25/2020 Pharyngeal Phase Impaired Pharyngeal- Pudding Teaspoon -- Pharyngeal -- Pharyngeal- Pudding Cup -- Pharyngeal -- Pharyngeal- Honey Teaspoon -- Pharyngeal -- Pharyngeal- Honey Cup -- Pharyngeal -- Pharyngeal- Nectar Teaspoon Reduced epiglottic inversion;Reduced anterior laryngeal mobility;Reduced laryngeal elevation;Reduced airway/laryngeal closure;Penetration/Aspiration before swallow;Significant aspiration (Amount);Pharyngeal residue - pyriform;Pharyngeal residue - valleculae Pharyngeal Material enters airway, passes BELOW cords and not ejected out despite cough attempt by patient Pharyngeal- Nectar Cup -- Pharyngeal -- Pharyngeal- Nectar Straw --  Pharyngeal -- Pharyngeal- Thin Teaspoon -- Pharyngeal -- Pharyngeal- Thin Cup -- Pharyngeal -- Pharyngeal- Thin Straw Reduced epiglottic inversion;Reduced anterior laryngeal mobility;Reduced laryngeal elevation;Reduced airway/laryngeal closure;Penetration/Aspiration before swallow;Significant aspiration (Amount);Pharyngeal residue - pyriform;Pharyngeal residue - valleculae Pharyngeal Material enters airway, passes BELOW cords and not ejected out despite cough attempt by patient Pharyngeal- Puree -- Pharyngeal -- Pharyngeal- Mechanical Soft -- Pharyngeal -- Pharyngeal- Regular -- Pharyngeal -- Pharyngeal-  Multi-consistency -- Pharyngeal -- Pharyngeal- Pill -- Pharyngeal -- Pharyngeal Comment --  No flowsheet data found. Juan Quam Laurice 04/25/2020, 11:50 AM              US Abdomen Limited RUQ (LIVER/GB)  Result Date: 04/18/2020 CLINICAL DATA:  Alcohol abuse. EXAM: ULTRASOUND ABDOMEN LIMITED RIGHT UPPER QUADRANT COMPARISON:  None. FINDINGS: Gallbladder: Gallbladder sludge fills the majority of the lumen of the gallbladder. No gallstones or wall thickening visualized. No pericholecystic fluid. No sonographic Murphy sign noted by sonographer. Common bile duct: Diameter: 3 mm. Liver: No focal lesion identified. Within normal limits in parenchymal echogenicity. Portal vein is patent on color Doppler imaging with normal direction of blood flow towards the liver. Other: None. IMPRESSION: Gallbladder sludge fills the majority of the lumen of the gallbladder. No associate findings of acute cholecystitis or choledocholithiasis. Electronically Signed   By: Iven Finn M.D.   On: 04/18/2020 02:16    Pathology:  SURGICAL PATHOLOGY  * THIS IS AN ADDENDUM REPORT *  CASE: [id number]  PATIENT: Eytan Mongillo  Surgical Pathology Report  *Addendum *   Reason for Addendum #1:  Immunohistochemistry results   Clinical History: laryngeal swelling (cm)    FINAL MICROSCOPIC DIAGNOSIS:   A. ESOPHAGUS, UPPER CERVICAL, BIOPSY:  - Benign squamous mucosa  - Negative for dysplasia or malignancy   B. LARYNX, BIOPSY:  - Invasive moderately to poorly differentiated squamous cell carcinoma.  See comment    COMMENT:   B.  Immunohistochemical stains show that the tumor cells are positive  for CK7, p63 and CK 5/6, consistent with above interpretation.  Immunostain for CK20 is negative.  Dr. Saralyn Pilar reviewed the case and  concurs with the diagnosis.  Dr. Constance Holster was paged on 04/26/2018.  Immunohistochemical stain for p16 is pending and will be reported in an  addendum.   GROSS DESCRIPTION:   A: The  specimen is received fresh and consists of 2 pieces of tan-red  soft tissue, measuring 0.3 and 0.5 cm.  The specimen is entirely  submitted in 1 cassette.   B: The specimen is received fresh and consists of a 1.1 x 0.7 x 0.3 cm  aggregate of tan-pink soft tissue.  The specimen is entirely submitted  in 1 cassette.  Craig Staggers 04/22/2020)   Final Diagnosis performed by Jaquita Folds, MD.   Electronically  signed 04/25/2020  Technical component performed at [facility]. [facility], [place] [address] [place], [place] [address].   Professional component performed at [facility],  [place] [address], [place], [place] [address].   Immunohistochemistry Technical component (if applicable) was performed  at [facility]. [address], [place],  [place],  [address].   IMMUNOHISTOCHEMISTRY DISCLAIMER (if applicable):  Some of these immunohistochemical stains may have been developed and the  performance characteristics determine by [facility]. Some  may not have been cleared or approved by the U.S. Food and Drug  Administration. The FDA has determined that such clearance or approval  is not necessary. This test is used for clinical purposes. It  should not  be regarded as investigational or for research. This laboratory is  certified under the [place]  (CLIA-88) as qualified to perform high complexity clinical laboratory  testing.  The controls stained appropriately.   ADDENDUM:   B.   Immunohistochemical stain for p16 is negative in the tumor cells.   Assessment and Plan:  Laryngeal squamous cell carcinoma, p16 negative -The patient has newly diagnosed laryngeal cancer. -Diagnosis discussed with the patient.  He has no significant cervical adenopathy or definitive distant metastasis on CT scan. -Likely locally advanced disease. -The option of surgery (total laryngectomy and lymph node dissection  followed by adjuvant radiation) versus concurrent chemoradiation were discussed with the patient.  The patient is not interested in surgery. -Case to be presented at the head neck tumor conference. -The patient will need to see radiation oncology as an outpatient. -We will make arrangements in our office for the patient to begin concurrent chemoradiation in the very near future.  Aspiration pneumonia -The patient has completed a course of antibiotics. -Monitor respiratory status.  Protein calorie malnutrition -The patient has dysphagia secondary to laryngeal mass. -G-tube in place. -Dietitian currently following and will ask for dietitian follow-up in our office.  Tobacco abuse -He was counseled about smoking cessation.  Alcohol abuse -The patient has not had any alcohol in about 4 months. -Recommend continued abstinence from alcohol.   Thank you for this referral.   Attending Note  I personally saw the patient, reviewed the chart and examined the patient. The plan of care was discussed with the patient and the admitting team. I agree with the assessment and plan as documented above. Thank you very much for the consultation. Mr Burlingame is a pleasant patient with newly diagnosed Laryngeal cancer. Per Dr Janeice Robinson note, large mass completely obliterating endolarynx spilled over lateral pharyngeal wall on right side. He had tracheostomy done on the same day Dr Constance Holster has discussed about treatment options including total laryngectomy with bilateral neck disection and post op radiation vs CRT. Patient is averse to surgery in general, We have discussed that CRT can be reasonable option but does come with severe side effects as well We have briefly discussed about cisplatin and side effects including but not limited to fatigue, nausea, vomiting, increased risk of infections, hearing loss, nephrotoxicity etc. I explained it multiple times and encouraged him to think about it. I wonder if there  is some medical illiteracy and I also wonder if he grasps the seriousness of the situation. He should do PET CT outpatient and FU with [place] in clinic before we proceed with treatment.      Mikey Bussing, DNP, AGPCNP-BC, AOCNP

## 2020-04-30 NOTE — Progress Notes (Signed)
Speech Language Pathology Treatment: Dysphagia;Passy Muir Speaking valve  Patient Details Name: Mario Peters MRN: [id number] DOB: 11-13-1965 Today's Date: 04/30/2020 Time: 7867-6720 SLP Time Calculation (min) (ACUTE ONLY): 15 min  Assessment / Plan / Recommendation Clinical Impression  Pt was seen with PMV placed for treatment to facilitate communication. Although he's dysphonic, PMV increases vocal intensity enough to improve intelligibility particularly at the word and phrase level. Education regarding PMV was reviewed with pt and family (wife and daughter present), with emphasis on when and how to use it to maximize safety. Offered to train pt how to don/doff PMV himself, but he declined. Attempted to also see him with PO trials (water via swab), but pt declined this as well. He says that in addition to using swabs he has also been trying ice chips, but he assures me that he is not trying to swallow them. He says he uses his yankauer to remove the melted ice from his oral cavity, and based upon the amount of liquid in the canister, it seems likely that he is. Education was reinforced with pt and family about results of MBS and current recommendations. Also encouraged pt to continue to use his swallowing muscles as much as possible - even if this means starting to think about performing dry swallows throughout the day as much as possible. Pt will benefit from ongoing SLP services acutely and at next level of care to maximize swallowing function particularly if he plans to pursue chemo/XRT as opposed to surgery.    HPI HPI: Pt is a 55 y.o. male with medical history significant of EtOH abuse,  tobacco abuse, remote hx of seizures. He presented via EMS due to inability to get out of bed, weight loss, and productive cough x4 months and increased difficulty swallowing. CXR 3/30: Ill-defined left greater than right reticulonodular and ground-glass opacity, suspicious for bilateral pneumonia, potentially  atypical or viral pneumonia. CT soft tissue: Mildly hyperdense focus at the posterior aspect of the left vocal fold with associated soft tissue thickening. 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 airway is partially compromised.Trach recommended.  Pt was to have trach placed on Friday 4/1 but it was cancelled, is now tentatively scheduled for [place] 4/4.  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  There were no recognizable structures.  It spilled over along the lateral pharyngeal wall on the right side.  The left piriform sinus appeared to be clear." G-tube placed 4/6. ENT note 4/8: pathology revealed squamous cell carcinoma; Treatment options are going to include total laryngectomy with bilateral neck dissection and postop radiation. Other option is going to be chemotherapy with radiation as laryngeal preservation. Per ENT's not on 4/11 pt is more interested in chemo and radiation than surgery.      SLP Plan  Continue with current plan of care       Recommendations  Diet recommendations: NPO Medication Administration: Via alternative means      Patient may use Passy-Muir Speech Valve: During all therapies with supervision PMSV Supervision: Full         Oral Care Recommendations: Oral care QID Follow up Recommendations: Outpatient SLP SLP Visit Diagnosis: Dysphagia, pharyngeal phase (R13.13) Plan: Continue with current plan of  care       GO                Osie Bond., M.A. [facility] Pager [phone] Office [phone]  04/30/2020, 2:43 PM

## 2020-04-30 NOTE — Plan of Care (Signed)
  Problem: Activity: Goal: Ability to tolerate increased activity will improve Outcome: Not Progressing   Problem: Clinical Measurements: Goal: Ability to maintain a body temperature in the normal range will improve Outcome: Not Progressing   Problem: Respiratory: Goal: Ability to maintain adequate ventilation will improve Outcome: Not Progressing Goal: Ability to maintain a clear airway will improve Outcome: Not Progressing   Problem: Education: Goal: Knowledge of General Education information will improve Description: Including pain rating scale, medication(s)/side effects and non-pharmacologic comfort measures Outcome: Not Progressing   Problem: Health Behavior/Discharge Planning: Goal: Ability to manage health-related needs will improve Outcome: Not Progressing   Problem: Clinical Measurements: Goal: Ability to maintain clinical measurements within normal limits will improve Outcome: Not Progressing Goal: Will remain free from infection Outcome: Not Progressing Goal: Diagnostic test results will improve Outcome: Not Progressing Goal: Respiratory complications will improve Outcome: Not Progressing Goal: Cardiovascular complication will be avoided Outcome: Not Progressing   Problem: Activity: Goal: Risk for activity intolerance will decrease Outcome: Not Progressing

## 2020-05-01 ENCOUNTER — Other Ambulatory Visit: Payer: Self-pay

## 2020-05-01 ENCOUNTER — Telehealth: Payer: Self-pay | Admitting: Hematology and Oncology

## 2020-05-01 ENCOUNTER — Other Ambulatory Visit: Payer: Self-pay | Admitting: Hematology and Oncology

## 2020-05-01 DIAGNOSIS — C329 Malignant neoplasm of larynx, unspecified: Secondary | ICD-10-CM

## 2020-05-01 LAB — CBC WITH DIFFERENTIAL/PLATELET
Abs Immature Granulocytes: 0.04 10*3/uL (ref 0.00–0.07)
Basophils Absolute: 0.1 10*3/uL (ref 0.0–0.1)
Basophils Relative: 1 %
Eosinophils Absolute: 0.1 10*3/uL (ref 0.0–0.5)
Eosinophils Relative: 1 %
HCT: 44.3 % (ref 39.0–52.0)
Hemoglobin: 14.4 g/dL (ref 13.0–17.0)
Immature Granulocytes: 1 %
Lymphocytes Relative: 24 %
Lymphs Abs: 1.9 10*3/uL (ref 0.7–4.0)
MCH: 29.8 pg (ref 26.0–34.0)
MCHC: 32.5 g/dL (ref 30.0–36.0)
MCV: 91.5 fL (ref 80.0–100.0)
Monocytes Absolute: 1 10*3/uL (ref 0.1–1.0)
Monocytes Relative: 13 %
Neutro Abs: 4.6 10*3/uL (ref 1.7–7.7)
Neutrophils Relative %: 60 %
Platelets: 391 10*3/uL (ref 150–400)
RBC: 4.84 MIL/uL (ref 4.22–5.81)
RDW: 12.1 % (ref 11.5–15.5)
WBC: 7.6 10*3/uL (ref 4.0–10.5)
nRBC: 0 % (ref 0.0–0.2)

## 2020-05-01 LAB — BASIC METABOLIC PANEL
Anion gap: 6 (ref 5–15)
BUN: 21 mg/dL — ABNORMAL HIGH (ref 6–20)
CO2: 31 mmol/L (ref 22–32)
Calcium: 9 mg/dL (ref 8.9–10.3)
Chloride: 93 mmol/L — ABNORMAL LOW (ref 98–111)
Creatinine, Ser: 0.52 mg/dL — ABNORMAL LOW (ref 0.61–1.24)
GFR, Estimated: >60 mL/min (ref >60)
Glucose, Bld: 91 mg/dL (ref 70–99)
Potassium: 3.7 mmol/L (ref 3.5–5.1)
Sodium: 130 mmol/L — ABNORMAL LOW (ref 135–145)

## 2020-05-01 LAB — MAGNESIUM: Magnesium: 1.7 mg/dL (ref 1.7–2.4)

## 2020-05-01 LAB — GLUCOSE, CAPILLARY
Glucose-Capillary: 55 mg/dL — ABNORMAL LOW (ref 70–99)
Glucose-Capillary: 158 mg/dL — ABNORMAL HIGH (ref 70–99)
Glucose-Capillary: 132 mg/dL — ABNORMAL HIGH (ref 70–99)
Glucose-Capillary: 115 mg/dL — ABNORMAL HIGH (ref 70–99)
Glucose-Capillary: 87 mg/dL (ref 70–99)
Glucose-Capillary: 129 mg/dL — ABNORMAL HIGH (ref 70–99)

## 2020-05-01 NOTE — Progress Notes (Signed)
PET scan ordered 

## 2020-05-01 NOTE — Progress Notes (Signed)
Patient ID: Mario Peters, male   DOB: 1965/06/09, 55 y.o.   MRN: [id number]  PROGRESS NOTE    Mario Peters  GYJ:[id number] DOB: 07-04-1965 DOA: 04/17/2020 PCP: Patient, [name]   Brief Narrative:  55 year old male with history of alcohol and tobacco abuse presented with weakness, ongoing dysphagia and very poor oral intake for 4 months along with weight loss, hoarseness, cough and congestion.  He was diagnosed with sepsis secondary to aspiration pneumonia along with large laryngeal mass concerning for malignancy , severe dysphagia and risk of airway compromise.  ENT was consulted.  He underwent tracheostomy on 04/22/2020 along with direct laryngoscopy with biopsy.  He was briefly transferred to ICU but has been subsequently transferred out of ICU.  Assessment & Plan:   Severe sepsis: Present on admission.Aspiration pneumonia -Sepsis has resolved.  Cultures negative so far. -Status post treatment with cefepime for 7 days.  Also received Zithromax for 5 days.  Completed treatment.  New diagnosis of squamous cell carcinoma of larynx Acute respiratory failure with hypoxia and severe dysphagia. -Status post tracheostomy, direct laryngoscopy and biopsy. -Was transferred briefly to ICU but has been subsequently transferred out of ICU -Currently on 5 L oxygen via tracheostomy.  Wean off as able.  Pulmonary toileting.  Family learning. -Trach care as per ENT.  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 Feng/oncology appreciated: She will set up radiation oncology follow-up as well.  Outpatient follow-up with oncology/radiation oncology -Status post G-tube placement by IR on 04/24/2020.  Continue bolus tube feeds as per dietary recommendations.  Acute kidney injury -Resolved with hydration  Tobacco abuse -Counseled.  Alcohol abuse Alcoholic liver disease -Ultrasound was negative for cirrhosis of liver.  Stable.  Hypoalbuminemia Severe protein calorie  malnutrition -Follow nutrition recommendations.  Now on tube feeding.  Hyponatremia -Mild.  Improved  Hypomagnesemia -Improved, normalized.  Hypophosphatemia -Possibly likely from refeeding syndrome.  Improved.  Normalized.  DVT prophylaxis: Lovenox Code Status: Full Family Communication: Wife at the bedside. Disposition Plan: Status is: Inpatient  Remains inpatient appropriate because:Inpatient level of care appropriate due to severity of illness   Dispo: The patient is from: Home              Anticipated d/c is to: Home once arrangements for tracheostomy care, supplemental oxygen, tube feeds and home health have been made              Patient currently is medically stable to d/c.   Difficult to place patient No   Consultants: ENT/IR  Procedures: tracheostomy, direct laryngoscopy and biopsy   Antimicrobials:  Anti-infectives (From admission, onward)   Start     Dose/Rate Route Frequency Ordered Stop   04/24/20 1800  ceFAZolin (ANCEF) IVPB 2g/100 mL premix        2 g 200 mL/hr over 30 Minutes Intravenous  Once 04/24/20 1354 04/24/20 1657   04/24/20 1120  ceFAZolin (ANCEF) IVPB 2g/100 mL premix        2 g 200 mL/hr over 30 Minutes Intravenous  Once 04/24/20 1052 04/24/20 1050   04/18/20 1800  ceFEPIme (MAXIPIME) 2 g in sodium chloride 0.9 % 100 mL IVPB        2 g 200 mL/hr over 30 Minutes Intravenous Every 8 hours 04/18/20 1029 04/23/20 2359   04/18/20 1100  vancomycin (VANCOREADY) IVPB 750 mg/150 mL  Status:  Discontinued        750 mg 150 mL/hr over 60 Minutes Intravenous Every 12 hours 04/18/20 1029  04/18/20 1039   04/18/20 1000  ceFEPIme (MAXIPIME) 2 g in sodium chloride 0.9 % 100 mL IVPB  Status:  Discontinued        2 g 200 mL/hr over 30 Minutes Intravenous Every 12 hours 04/17/20 2153 04/18/20 1029   04/17/20 2300  azithromycin (ZITHROMAX) 500 mg in sodium chloride 0.9 % 250 mL IVPB  Status:  Discontinued        500 mg 250 mL/hr over 60 Minutes Intravenous  Every 24 hours 04/17/20 2253 04/22/20 0950   04/17/20 2153  vancomycin variable dose per unstable renal function (pharmacist dosing)  Status:  Discontinued         Does not apply See admin instructions 04/17/20 2153 04/18/20 1039   04/17/20 1930  vancomycin (VANCOREADY) IVPB 1000 mg/200 mL        1,000 mg 200 mL/hr over 60 Minutes Intravenous  Once 04/17/20 1900 04/17/20 2323   04/17/20 1915  ceFEPIme (MAXIPIME) 2 g in sodium chloride 0.9 % 100 mL IVPB        2 g 200 mL/hr over 30 Minutes Intravenous  Once 04/17/20 1900 04/17/20 2101   04/17/20 1915  metroNIDAZOLE (FLAGYL) IVPB 500 mg        500 mg 100 mL/hr over 60 Minutes Intravenous  Once 04/17/20 1900 04/17/20 2209        Subjective: Patient seen and examined.  Wife was at the bedside.  Patient denied any complaints.  He gives me thumbs up.  Still on 5 L of oxygen but feels comfortable.  No other overnight events.  objective: Vitals:   05/01/20 0500 05/01/20 0753 05/01/20 0910 05/01/20 1142  BP:  125/90  135/90  Pulse:  85 78 87  Resp:  18 18 (!) 23  Temp:  98.6 F (37 C)  98.6 F (37 C)  TempSrc:  Oral  Oral  SpO2:  100% 100% 100%  Weight: 52 kg     Height:        Intake/Output Summary (Last 24 hours) at 05/01/2020 1200 Last data filed at 04/30/2020 1500 Gross per 24 hour  Intake --  Output 200 ml  Net -200 ml   Filed Weights   04/28/20 0446 04/29/20 0456 05/01/20 0500  Weight: 50.6 kg 50.5 kg 52 kg    Examination:  General: Frail and debilitated.  Looks comfortable.  On trach collar with 5 L oxygen.   Cardiovascular: S1-S2 normal.  No added sounds. Respiratory: Bilateral good air entry.  Mostly conducted airway sounds.  Tracheostomy in place with minimal clear secretions. Gastrointestinal: Soft and nontender.  PEG tube site clean and dry. Ext: No edema or cyanosis. Neuro: No focal deficits.  Equal strength in all extremities. Musculoskeletal: No deformities or swelling.   Data Reviewed: I have personally  reviewed following labs and imaging studies  CBC: Recent Labs  Lab 04/25/20 0200 04/27/20 0258 04/28/20 0109 04/29/20 0116 05/01/20 1043  WBC 8.0 15.4* 15.4* 10.3 7.6  NEUTROABS 5.9 13.0* 13.1* 8.0* 4.6  HGB 11.5* 13.1 13.2 13.0 14.4  HCT 34.9* 38.5* 40.1 39.0 44.3  MCV 93.3 91.4 91.8 93.5 91.5  PLT 234 274 284 286 409   Basic Metabolic Panel: Recent Labs  Lab 04/24/20 1751 04/24/20 1809 04/25/20 0200 04/25/20 0200 04/26/20 0950 04/26/20 1626 04/27/20 0258 04/28/20 0109 04/29/20 0116 05/01/20 0524  NA 134*  --   --   --   --   --  133* 133* 135 130*  K 4.3  --   --   --   --   --  5.0 3.9 4.2 3.7  CL 97*  --   --   --   --   --  98 98 98 93*  CO2 33*  --   --   --   --   --  26 27 28 31   GLUCOSE 102*  --   --   --   --   --  116* 118* 162* 91  BUN 7  --   --   --   --   --  15 25* 33* 21*  CREATININE 0.39*  --   --   --   --   --  0.47* 0.49* 0.50* 0.52*  CALCIUM 7.7*  --   --   --   --   --  8.5* 8.7* 8.8* 9.0  MG  --   --  1.4*   < > 1.3* 2.1 2.0 1.7 1.8 1.7  PHOS  --  3.0 3.0  --  3.4 3.3 3.7  --   --   --    < > = values in this interval not displayed.   GFR: Estimated Creatinine Clearance: 77.6 mL/min (A) (by C-G formula based on SCr of 0.52 mg/dL (L)). Liver Function Tests: Recent Labs  Lab 04/27/20 0258 04/29/20 0116  AST 18 29  ALT 11 19  ALKPHOS 62 58  BILITOT 0.9 0.5  PROT 6.0* 6.5  ALBUMIN 2.2* 2.1*   No results for input(s): LIPASE, AMYLASE in the last 168 hours. No results for input(s): AMMONIA in the last 168 hours. Coagulation Profile: No results for input(s): INR, PROTIME in the last 168 hours. Cardiac Enzymes: No results for input(s): CKTOTAL, CKMB, CKMBINDEX, TROPONINI in the last 168 hours. BNP (last 3 results) No results for input(s): PROBNP in the last 8760 hours. HbA1C: No results for input(s): HGBA1C in the last 72 hours. CBG: Recent Labs  Lab 04/30/20 2023 04/30/20 2353 05/01/20 0443 05/01/20 0751 05/01/20 1141  GLUCAP  135* 96 55* 158* 132*   Lipid Profile: No results for input(s): CHOL, HDL, LDLCALC, TRIG, CHOLHDL, LDLDIRECT in the last 72 hours. Thyroid Function Tests: No results for input(s): TSH, T4TOTAL, FREET4, T3FREE, THYROIDAB in the last 72 hours. Anemia Panel: No results for input(s): VITAMINB12, FOLATE, FERRITIN, TIBC, IRON, RETICCTPCT in the last 72 hours. Sepsis Labs: No results for input(s): PROCALCITON, LATICACIDVEN in the last 168 hours.  Recent Results (from the past 240 hour(s))  MRSA PCR Screening     Status: None   Collection Time: 04/23/20  4:15 PM   Specimen: Nasopharyngeal  Result Value Ref Range Status   MRSA by PCR NEGATIVE NEGATIVE Final    Comment:        The GeneXpert MRSA Assay (FDA approved for NASAL specimens only), is one component of a comprehensive MRSA colonization surveillance program. It is not intended to diagnose MRSA infection nor to guide or monitor treatment for MRSA infections. Performed at [facility], [place] [address], [place], [place] [address]          Radiology Studies: No results found.      Scheduled Meds: . chlorhexidine  15 mL Mouth Rinse BID  . Chlorhexidine Gluconate Cloth  6 each Topical Daily  . enoxaparin (LOVENOX) injection  40 mg Subcutaneous Daily  . feeding supplement (OSMOLITE 1.5 CAL)  360 mL Per Tube QID  . mouth rinse  15 mL Mouth Rinse q12n4p  . nicotine  21 mg Transdermal Daily  . senna-docusate  1 tablet Per Tube  BID   Continuous Infusions:   Total time spent: 30 minutes in direct patient care.      Barb Merino, MD [facility] 05/01/2020, 12:00 PM

## 2020-05-01 NOTE — Telephone Encounter (Signed)
Scheduled appt per 4/13 sch msg. Called pt, no answer. Vm was full. Will mail an updated calendar to pt.

## 2020-05-01 NOTE — Progress Notes (Signed)
Trach care education done. Wife and sister at bedside. Sister very quick to learn basic trach care. Booklet for trach care left with both wife and sister along with a stand style mirror to assist patient with participation in self trach care. Basic supplies noted at bedside.  Questions answered and number for respiratory dept also left with family. Pt possibly would benefit from being referred to trach clinic.

## 2020-05-01 NOTE — Progress Notes (Signed)
PT Cancellation Note  Patient Details Name: Mario Peters MRN: [id number] DOB: 1965/12/04   Cancelled Treatment:    Reason Eval/Treat Not Completed: Other (comment) (Pt adamant he was not walking this am until after bath. Will return as able.)   Alvira Philips 05/01/2020, 11:19 AM Mckenzey Parcell M,PT Acute Rehab Services [phone] (pager)

## 2020-05-01 NOTE — Progress Notes (Signed)
OT Cancellation Note  Patient Details Name: Mario Peters MRN: [id number] DOB: 04-19-1965   Cancelled Treatment:    Reason Eval/Treat Not Completed: Patient declined, no reason specified (pt declined x2 times today stating "not right now" Pt has performed all ADL at sink with spouse. OT to continue to follow.)   Jefferey Pica, OTR/L Acute Rehabilitation Services Pager: [phone] Office: [phone]   Laveda Demedeiros C 05/01/2020, 5:52 PM

## 2020-05-02 LAB — GLUCOSE, CAPILLARY
Glucose-Capillary: 97 mg/dL (ref 70–99)
Glucose-Capillary: 81 mg/dL (ref 70–99)
Glucose-Capillary: 114 mg/dL — ABNORMAL HIGH (ref 70–99)
Glucose-Capillary: 62 mg/dL — ABNORMAL LOW (ref 70–99)
Glucose-Capillary: 105 mg/dL — ABNORMAL HIGH (ref 70–99)

## 2020-05-02 MED ORDER — HYDROCODONE-ACETAMINOPHEN 5-325 MG PO TABS: 1.0000 | ORAL_TABLET | Freq: Four times a day (QID) | ORAL | 0 refills | Status: AC | PRN

## 2020-05-02 MED ORDER — SENNOSIDES-DOCUSATE SODIUM 8.6-50 MG PO TABS: 1.0000 | ORAL_TABLET | Freq: Two times a day (BID) | ORAL | 0 refills | Status: AC

## 2020-05-02 MED ORDER — OSMOLITE 1.5 CAL PO LIQD: 360.0000 mL | Freq: Four times a day (QID) | ORAL | 0 refills | Status: DC

## 2020-05-02 MED ORDER — OSMOLITE 1.5 CAL PO LIQD: 474.0000 mL | Freq: Four times a day (QID) | ORAL | 0 refills | Status: DC

## 2020-05-02 MED ORDER — NICOTINE 21 MG/24HR TD PT24: 21.0000 mg | MEDICATED_PATCH | Freq: Every day | TRANSDERMAL | 0 refills | Status: DC

## 2020-05-02 MED ORDER — ACETAMINOPHEN 325 MG PO TABS: 650.0000 mg | ORAL_TABLET | Freq: Four times a day (QID) | ORAL | Status: AC | PRN

## 2020-05-02 MED ORDER — OSMOLITE 1.5 CAL PO LIQD
474.0000 mL | Freq: Four times a day (QID) | ORAL | Status: DC
  Administered 2020-05-02: 474 mL
  Filled 2020-05-02 (×3): qty 474

## 2020-05-02 NOTE — Progress Notes (Signed)
Occupational Therapy Treatment Patient Details Name: Mario Peters MRN: [id number] DOB: 1965/12/25 Today's Date: 05/02/2020    History of present illness Pt is an 55 y.o. male. Admitted 04/22/20 with difficulty with swallowing and weight loss (increased over the past 4 months) dehydration and hypotension as well as infection (pneumonia). 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  Tracheostomy tube (04/22/2020); Direct laryngoscopy (04/22/2020); and esophagoscopy (04/22/2020). PMH includes: Long history of smoking and ETOH, seizures, low health care literacy.   OT comments  Pt agreeable to OT session. Pt currently requires supervision at RW level for functional mobility and completion of ADL in the room. Pt on RA and maintained SpO2 >90% with PSMV. Continued to educate pt and family on energy conservation strategies with ADL/IADL and functional mobility. Pt verbalized understanding, family verbalized understanding. Pt will continue to benefit from skilled OT services to maximize safety and independence with ADL/IADL and functional mobility. Will continue to follow acutely and progress as tolerated.    Follow Up Recommendations  Home health OT    Equipment Recommendations  3 in 1 bedside commode    Recommendations for Other Services      Precautions / Restrictions Precautions Precautions: Fall Precaution Comments: trach Restrictions Weight Bearing Restrictions: No       Mobility Bed Mobility Overal bed mobility: Modified Independent             General bed mobility comments: increased time and effort, HOB elevated    Transfers Overall transfer level: Needs assistance Equipment used: None Transfers: Sit to/from Stand Sit to Stand: Supervision         General transfer comment: supervision for lines and safety    Balance Overall balance assessment: Needs assistance Sitting-balance support: No upper extremity supported;Feet supported Sitting balance-[name]:  Good     Standing balance support: No upper extremity supported;During functional activity Standing balance-[name]: Fair Standing balance comment: statically, can stand without UE support but dynamically needs UE support                           ADL either performed or assessed with clinical judgement   ADL Overall ADL's : Needs assistance/impaired Eating/Feeding: NPO   Grooming: Wash/dry face;Oral care;Standing;Min guard Grooming Details (indicate cue type and reason): continued to provide education on compensatory methods for grooming tasks as needed with pt and family verabalizing understanding             Lower Body Dressing: Sit to/from stand;Supervision/safety Lower Body Dressing Details (indicate cue type and reason): pt able to adjust/don LB clothing Toilet Transfer: Ambulation;RW;Supervision/safety   Toileting- Clothing Manipulation and Hygiene: Sit to/from stand;Supervision/safety Toileting - Clothing Manipulation Details (indicate cue type and reason): balanace and safety     Functional mobility during ADLs: Rolling walker;Supervision/safety General ADL Comments: continued to provide education regarding energy conservation strategies with ADL/IADL and functional mobility. Pt on RA throughout session, SpO2 >90% with PMSV     Vision   Vision Assessment?: No apparent visual deficits   Perception     Praxis      Cognition Arousal/Alertness: Awake/alert Behavior During Therapy: WFL for tasks assessed/performed Overall Cognitive Status: Within Functional Limits for tasks assessed                                 General Comments: donned PMV with pt communicating needs effectively and expressing desire to DC home  Exercises     Shoulder Instructions       General Comments Pt on RA during ADL, SpO2 >90%    Pertinent Vitals/ Pain       Pain Assessment: No/denies pain Pain Intervention(s): Limited activity within  patient's tolerance;Monitored during session  Home Living                                          Prior Functioning/Environment              Frequency  Min 2X/week        Progress Toward Goals  OT Goals(current goals can now be found in the care plan section)  Progress towards OT goals: Progressing toward goals  Acute Rehab OT Goals Patient Stated Goal: go home OT Goal Formulation: With patient/family Time For Goal Achievement: 05/07/20 Potential to Achieve Goals: Good ADL Goals Pt Will Perform Grooming: standing;with supervision Pt Will Perform Upper Body Dressing: with modified independence;sitting Pt Will Perform Lower Body Dressing: with supervision;sit to/from stand Pt Will Transfer to Toilet: with supervision;ambulating Pt Will Perform Toileting - Clothing Manipulation and hygiene: sit to/from stand;with supervision Additional ADL Goal #1: Pt will recall 3 ways of conserving energy during ADL with zero cues  Plan Discharge plan remains appropriate;Frequency remains appropriate    Co-evaluation                 AM-PAC OT "6 Clicks" Daily Activity     Outcome Measure   Help from another person eating meals?:  (NPO) Help from another person taking care of personal grooming?: A Little Help from another person toileting, which includes using toliet, bedpan, or urinal?: A Little Help from another person bathing (including washing, rinsing, drying)?: A Little Help from another person to put on and taking off regular upper body clothing?: None Help from another person to put on and taking off regular lower body clothing?: A Little 6 Click Score: 16    End of Session Equipment Utilized During Treatment: Gait belt;Rolling walker  OT Visit Diagnosis: Unsteadiness on feet (R26.81);Muscle weakness (generalized) (M62.81)   Activity Tolerance Patient tolerated treatment well   Patient Left in chair;with call bell/phone within reach;with  family/visitor present   Nurse Communication Mobility status        Time: 8159-4707 OT Time Calculation (min): 25 min  Charges: OT General Charges $OT Visit: 1 Visit OT Treatments $Self Care/Home Management : 23-37 mins  Helene Kelp OTR/L Acute Rehabilitation Services Office: [place] 05/02/2020, 10:41 AM

## 2020-05-02 NOTE — Progress Notes (Signed)
Patient ID: Mario Peters, male   DOB: [birth year], 55 y.o.   MRN: [id number]  PROGRESS NOTE    Mario Peters  QZR:[id number] DOB: 1965/07/11 DOA: 04/17/2020 PCP: Patient, [name]   Brief Narrative:  55 year old male with history of alcohol and tobacco abuse presented with weakness, ongoing dysphagia and very poor oral intake for 4 months along with weight loss, hoarseness, cough and congestion.  He was diagnosed with sepsis secondary to aspiration pneumonia along with large laryngeal mass concerning for malignancy , severe dysphagia and risk of airway compromise.  ENT was consulted.  He underwent tracheostomy on 04/22/2020 along with direct laryngoscopy with biopsy.  He was briefly transferred to ICU but has been subsequently transferred out of ICU.  Assessment & Plan:   Severe sepsis: Present on admission.Aspiration pneumonia -Sepsis has resolved.  Cultures negative so far. -Status post treatment with cefepime for 7 days.  Also received Zithromax for 5 days.  Completed treatment.  New diagnosis of squamous cell carcinoma of larynx Acute respiratory failure with hypoxia and severe dysphagia. -Status post tracheostomy, direct laryngoscopy and biopsy. -Was transferred briefly to ICU but has been subsequently transferred out of ICU -Currently on 5 L oxygen via tracheostomy.  Wean off as able.  Pulmonary toileting.  Family learning. -Trach care as per ENT.  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 Feng/oncology appreciated: She will set up radiation oncology follow-up as well.  Outpatient follow-up with oncology/radiation oncology -Status post G-tube placement by IR on 04/24/2020.  Continue bolus tube feeds as per dietary recommendations.  Acute kidney injury -Resolved with hydration  Tobacco abuse -Counseled.  Alcohol abuse Alcoholic liver disease -Ultrasound was negative for cirrhosis of liver.  Stable.  Hypoalbuminemia Severe protein calorie  malnutrition -Follow nutrition recommendations.  Now on tube feeding.  Hyponatremia -Mild.  Improved  Hypomagnesemia -Improved, normalized.  Hypophosphatemia -Possibly likely from refeeding syndrome.  Improved.  Normalized.  DVT prophylaxis: Lovenox Code Status: Full Family Communication: Wife and sister at the bedside. Disposition Plan: Status is: Inpatient  Remains inpatient appropriate because:Inpatient level of care appropriate due to severity of illness   Dispo: The patient is from: Home              Anticipated d/c is to: Home once arrangements for tracheostomy care, supplemental oxygen, tube feeds and home health have been made              Patient currently is medically stable to d/c.   Difficult to place patient No   Consultants: ENT/IR  Procedures: tracheostomy, direct laryngoscopy and biopsy   Antimicrobials:  Anti-infectives (From admission, onward)   Start     Dose/Rate Route Frequency Ordered Stop   04/24/20 1800  ceFAZolin (ANCEF) IVPB 2g/100 mL premix        2 g 200 mL/hr over 30 Minutes Intravenous  Once 04/24/20 1354 04/24/20 1657   04/24/20 1120  ceFAZolin (ANCEF) IVPB 2g/100 mL premix        2 g 200 mL/hr over 30 Minutes Intravenous  Once 04/24/20 1052 04/24/20 1050   04/18/20 1800  ceFEPIme (MAXIPIME) 2 g in sodium chloride 0.9 % 100 mL IVPB        2 g 200 mL/hr over 30 Minutes Intravenous Every 8 hours 04/18/20 1029 04/23/20 2359   04/18/20 1100  vancomycin (VANCOREADY) IVPB 750 mg/150 mL  Status:  Discontinued        750 mg 150 mL/hr over 60 Minutes Intravenous Every 12 hours  04/18/20 1029 04/18/20 1039   04/18/20 1000  ceFEPIme (MAXIPIME) 2 g in sodium chloride 0.9 % 100 mL IVPB  Status:  Discontinued        2 g 200 mL/hr over 30 Minutes Intravenous Every 12 hours 04/17/20 2153 04/18/20 1029   04/17/20 2300  azithromycin (ZITHROMAX) 500 mg in sodium chloride 0.9 % 250 mL IVPB  Status:  Discontinued        500 mg 250 mL/hr over 60 Minutes  Intravenous Every 24 hours 04/17/20 2253 04/22/20 0950   04/17/20 2153  vancomycin variable dose per unstable renal function (pharmacist dosing)  Status:  Discontinued         Does not apply See admin instructions 04/17/20 2153 04/18/20 1039   04/17/20 1930  vancomycin (VANCOREADY) IVPB 1000 mg/200 mL        1,000 mg 200 mL/hr over 60 Minutes Intravenous  Once 04/17/20 1900 04/17/20 2323   04/17/20 1915  ceFEPIme (MAXIPIME) 2 g in sodium chloride 0.9 % 100 mL IVPB        2 g 200 mL/hr over 30 Minutes Intravenous  Once 04/17/20 1900 04/17/20 2101   04/17/20 1915  metroNIDAZOLE (FLAGYL) IVPB 500 mg        500 mg 100 mL/hr over 60 Minutes Intravenous  Once 04/17/20 1900 04/17/20 2209        Subjective: Patient seen and examined.  Very much looking forward to go home.  Denies any complaints. Patient and family wants to have surgery now.  They know that they will follow-up with the cancer clinic and will subsequently be referred to surgery if they want it.  objective: Vitals:   05/01/20 2035 05/02/20 0016 05/02/20 0500 05/02/20 0746  BP: 120/72   98/67  Pulse: 90 81  78  Resp: 18 18  20   Temp: 98.2 F (36.8 C)   97.8 F (36.6 C)  TempSrc: Oral   Oral  SpO2: 94% 97%  99%  Weight:   54.1 kg   Height:       No intake or output data in the 24 hours ending 05/02/20 1039 Filed Weights   04/29/20 0456 05/01/20 0500 05/02/20 0500  Weight: 50.5 kg 52 kg 54.1 kg    Examination:  General: Frail and debilitated.  Looks comfortable.  On trach collar with 5 L oxygen.   Cardiovascular: S1-S2 normal.  No added sounds. Respiratory: Bilateral good air entry.  Mostly conducted airway sounds.  Tracheostomy in place with no secretions. Poor dentition. Gastrointestinal: Soft and nontender.  PEG tube site clean and dry. Ext: No edema or cyanosis. Neuro: No focal deficits.  Equal strength in all extremities. Musculoskeletal: No deformities or swelling.   Data Reviewed: I have personally  reviewed following labs and imaging studies  CBC: Recent Labs  Lab 04/27/20 0258 04/28/20 0109 04/29/20 0116 05/01/20 1043  WBC 15.4* 15.4* 10.3 7.6  NEUTROABS 13.0* 13.1* 8.0* 4.6  HGB 13.1 13.2 13.0 14.4  HCT 38.5* 40.1 39.0 44.3  MCV 91.4 91.8 93.5 91.5  PLT 274 284 286 026   Basic Metabolic Panel: Recent Labs  Lab 04/26/20 0950 04/26/20 1626 04/27/20 0258 04/28/20 0109 04/29/20 0116 05/01/20 0524  NA  --   --  133* 133* 135 130*  K  --   --  5.0 3.9 4.2 3.7  CL  --   --  98 98 98 93*  CO2  --   --  26 27 28 31   GLUCOSE  --   --  116* 118* 162* 91  BUN  --   --  15 25* 33* 21*  CREATININE  --   --  0.47* 0.49* 0.50* 0.52*  CALCIUM  --   --  8.5* 8.7* 8.8* 9.0  MG 1.3* 2.1 2.0 1.7 1.8 1.7  PHOS 3.4 3.3 3.7  --   --   --    GFR: Estimated Creatinine Clearance: 80.8 mL/min (A) (by C-G formula based on SCr of 0.52 mg/dL (L)). Liver Function Tests: Recent Labs  Lab 04/27/20 0258 04/29/20 0116  AST 18 29  ALT 11 19  ALKPHOS 62 58  BILITOT 0.9 0.5  PROT 6.0* 6.5  ALBUMIN 2.2* 2.1*   No results for input(s): LIPASE, AMYLASE in the last 168 hours. No results for input(s): AMMONIA in the last 168 hours. Coagulation Profile: No results for input(s): INR, PROTIME in the last 168 hours. Cardiac Enzymes: No results for input(s): CKTOTAL, CKMB, CKMBINDEX, TROPONINI in the last 168 hours. BNP (last 3 results) No results for input(s): PROBNP in the last 8760 hours. HbA1C: No results for input(s): HGBA1C in the last 72 hours. CBG: Recent Labs  Lab 05/01/20 1611 05/01/20 1956 05/01/20 2250 05/02/20 0341 05/02/20 0743  GLUCAP 115* 87 129* 97 81   Lipid Profile: No results for input(s): CHOL, HDL, LDLCALC, TRIG, CHOLHDL, LDLDIRECT in the last 72 hours. Thyroid Function Tests: No results for input(s): TSH, T4TOTAL, FREET4, T3FREE, THYROIDAB in the last 72 hours. Anemia Panel: No results for input(s): VITAMINB12, FOLATE, FERRITIN, TIBC, IRON, RETICCTPCT in the  last 72 hours. Sepsis Labs: No results for input(s): PROCALCITON, LATICACIDVEN in the last 168 hours.  Recent Results (from the past 240 hour(s))  MRSA PCR Screening     Status: None   Collection Time: 04/23/20  4:15 PM   Specimen: Nasopharyngeal  Result Value Ref Range Status   MRSA by PCR NEGATIVE NEGATIVE Final    Comment:        The GeneXpert MRSA Assay (FDA approved for NASAL specimens only), is one component of a comprehensive MRSA colonization surveillance program. It is not intended to diagnose MRSA infection nor to guide or monitor treatment for MRSA infections. Performed at [facility], [place] [address] [place], [place] [address]          Radiology Studies: No results found.      Scheduled Meds: . chlorhexidine  15 mL Mouth Rinse BID  . Chlorhexidine Gluconate Cloth  6 each Topical Daily  . enoxaparin (LOVENOX) injection  40 mg Subcutaneous Daily  . feeding supplement (OSMOLITE 1.5 CAL)  360 mL Per Tube QID  . mouth rinse  15 mL Mouth Rinse q12n4p  . nicotine  21 mg Transdermal Daily  . senna-docusate  1 tablet Per Tube BID   Continuous Infusions:   Total time spent: 30 minutes in direct patient care.      Barb Merino, MD [facility] 05/02/2020, 10:39 AM

## 2020-05-02 NOTE — Progress Notes (Signed)
Speech Language Pathology Treatment: Dysphagia;Passy Muir Speaking valve  Patient Details Name: Mario Peters MRN: [id number] DOB: 01-30-1965 Today's Date: 05/02/2020 Time: 6222-9798 SLP Time Calculation (min) (ACUTE ONLY): 31.22 min  Assessment / Plan / Recommendation Clinical Impression  Pt was seen for treatment with his sister and wife present. Pt reported that he has been having ice chips and using oral suction thereafter. Pt and his family were educated regarding the need for frequent oral care to reduce oral pathogens which would increase risk of aspiration-related complications. Pt consumed small ice chips without overt s/sx of aspiration, but exhibited coughing with larger ice chips. Pt stated that he would like to have chicken nuggets and Coke. Both parties were educated that the pt's aspiration risk is with the pharyngeal phase of the swallow d/t the cancer and not with the oral phase of the swallow. He was therefore advised that he may taste Coke and then spit it out/suction if he so desires since he "really wants to taste something". However, his significant aspiration risk was reiterated. Pt's sister stated that the family and pt have decided on the laryngectomy, but that she does not know anything about it. Pt and his family were educated regarding pre-op and post-op laryngectomy anatomy and the potential impact of these changes on swallowing and speech including the potential need for an electrolarynx. Imaging was used for facilitate education and all parties verbalized understanding. Pt's sister stated that their father's friend used an electrolarynx and the pt stated that he would not mind using one. Pt's sister was advised that she would likely be educated further by the MD(s) during follow up appointments. Pt tolerated PMSV for the duration of the session with stable vitals and no respiratory distress. Vocal quality remains hoarse, but intensity was improved compared to the last  session with this SLP. Pt was able to independently demonstrate donning and doffing of the PMSV and stated that he is more comfortable with it. Pt's sister demonstrated understanding regarding PMSV cleaning and care, but stated that she is still unclear regarding trach care. RT was contacted by the RD who arrived during the session and pt's sister expressed gratitude. Pt currently has discharge orders and it is recommended that SLP services be continued following discharge for further education and training with the anticipation that more direct intervention would be needed post-op if pt/family moves forward with laryngectomy.    HPI HPI: Pt is a 55 y.o. male with medical history significant of EtOH abuse,  tobacco abuse, remote hx of seizures. He presented via EMS due to inability to get out of bed, weight loss, and productive cough x4 months and increased difficulty swallowing. CXR 3/30: Ill-defined left greater than right reticulonodular and ground-glass opacity, suspicious for bilateral pneumonia, potentially atypical or viral pneumonia. CT soft tissue: Mildly hyperdense focus at the posterior aspect of the left vocal fold with associated soft tissue thickening. 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 airway is partially compromised.Trach recommended.  Pt was to have trach placed on Friday 4/1 but it was cancelled, is now tentatively scheduled for [place] 4/4.  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  There were no recognizable structures.  It spilled over along the  lateral pharyngeal wall on the right side.  The left piriform sinus  appeared to be clear." G-tube placed 4/6. ENT note 4/8: pathology revealed squamous cell carcinoma; Treatment options are going to include total laryngectomy with bilateral neck dissection and postop radiation. Other option is going to be chemotherapy with radiation as laryngeal preservation. Per ENT's not on 4/11 pt is more interested in chemo and radiation than surgery.      SLP Plan  Continue with current plan of care       Recommendations  Diet recommendations: NPO Medication Administration: Via alternative means      Patient may use Passy-Muir Speech Valve: During all therapies with supervision PMSV Supervision: Full         Oral Care Recommendations: Oral care QID Follow up Recommendations: Outpatient SLP SLP Visit Diagnosis: Dysphagia, pharyngeal phase (R13.13) Plan: Continue with current plan of care       Jazmyne Beauchesne I. Hardin Negus, [place], [place] Office number [phone] Pager [place] 05/02/2020, 3:02 PM

## 2020-05-02 NOTE — Progress Notes (Signed)
Nutrition Follow-up  DOCUMENTATION CODES:   Severe malnutrition in context of acute illness/injury  INTERVENTION:   Changes in TF regimen discussed with Wendi with Case Management and provider was notified. RD increased from 6 cans/cartons to 8 cans/cartons of Osmolite 1.5 per day.   Tube Feeding via G-tube:   Increase TF to Osmolite 1.5 474 mL QID (8 cans per day) Provides 2840 kcals, 120 g of protein and 1448 mL of free water  Add Free Water of 600 mL (10 - 60 mL syringe fulls) per day  Provided patient and his sister with "Bolus or Syringe Tube Feeding Instructions" handout from [employer]. Reviewed basics of tube care and maintenance as well as discussed TF administration. TF regimen written in on handout as well.   Sister has not yet assisted with a bolus feeding; discussed with RN. RN to assist sister with next bolus and educate prior to discharge.   RD assessment notes have been forwarded to [facility] for continued care post discharge  NUTRITION DIAGNOSIS:   Severe Malnutrition related to acute illness,dysphagia (laryngeal mass) as evidenced by severe fat depletion,severe muscle depletion,per patient/family report.  Being addressed via TF   GOAL:   Patient will meet greater than or equal to 90% of their needs  Progressing  MONITOR:   TF tolerance,Weight trends,Labs,I & O's  REASON FOR ASSESSMENT:   Consult Enteral/tube feeding initiation and management  ASSESSMENT:   55 yo male with a PMH of EtOH and tobacco abuse who presents with severe sepsis, AKI, severe dysphagia 2/2 laryngeal mass, and aspiration PNA.   Noted plan for discharge today. Patient's sister and patient's wife present today.   Pt tolerating bolus feedings Osmolite 1.5 360 mL (1.5 cans/cartons) per day. No GI complaints   Pt continues to complain that he is hungry. Pt was hungry on goal rate of continuous feedings and current bolus  feeding regimen. Discussed increasing TF regimen with pt and his family to hopefully alleviate this. All in agreement  All questions with regards to TF and G-tube care answered  SLP working with patient with regards to [place]; with regards to po diet, pt is allowed ice chips but must suction out, cannot swallow. Pt wanting to "taste something"; SLP says it is ok for patient to use a lollipop but again put suck it out, cannot swallow  Labs: reviewed Meds: reviewed   Diet Order:   Diet Order            Diet NPO time specified Except for: Sips with Meds  Diet effective midnight                 EDUCATION NEEDS:   Education needs have been addressed  Skin:  Skin Assessment: Reviewed RN Assessment  Last BM:  4/11  Height:   Ht Readings from Last 1 Encounters:  04/19/20 5\' 7"  (1.702 m)    Weight:   Wt Readings from Last 1 Encounters:  05/02/20 54.1 kg    Ideal Body Weight:  67.3 kg  BMI:  Body mass index is 18.68 kg/m.  Estimated Nutritional Needs:   Kcal:  1950-2150  Protein:  90-115 g  Fluid:  >2 L  Kerman Passey MS, RDN, LDN, CNSC Registered Dietitian III Clinical Nutrition RD Pager and On-Call Pager Number Located in [place]

## 2020-05-02 NOTE — Progress Notes (Signed)
SLP Cancellation Note  Patient Details Name: Kreed Kauffman MRN: [id number] DOB: [birth year]   Cancelled treatment:       Reason Eval/Treat Not Completed: Other (comment) Attempted to see pt but he was working with another provider. Will f/u as able.     Osie Bond., M.A. [facility] Pager [phone] Office [phone]  05/02/2020, 10:57 AM

## 2020-05-02 NOTE — Discharge Summary (Signed)
Physician Discharge Summary  Fuad Forget HCW:[id number] DOB: 1965/05/30 DOA: 04/17/2020  PCP: Patient, [name]  Admit date: 04/17/2020 Discharge date: 05/02/2020  Admitted From: Home Disposition: Home with home health  Recommendations for Outpatient Follow-up:  1. Follow up with PCP in 1-2 weeks 2. Follow-up as scheduled with cancer center.  Home Health: Supplies Equipment/Devices: Hospital bed, bedside commode, 3 and 1  Discharge Condition: Fair CODE STATUS: Full code Diet recommendation: NPO.  PEG tube feeding.  Discharge summary: 55 year old male with history of alcohol and tobacco abuse presented with weakness, ongoing dysphagia and very poor oral intake for 4 months along with weight loss, hoarseness, cough and congestion.  He was diagnosed with sepsis secondary to aspiration pneumonia along with large laryngeal mass concerning for malignancy , severe dysphagia and risk of airway compromise.  ENT was consulted.  He underwent tracheostomy on 04/22/2020 along with direct laryngoscopy with biopsy.  He was briefly transferred to ICU but has been subsequently transferred out of ICU.  Assessment & Plan of care:  Severe sepsis: Present on admission due to aspiration pneumonia -Sepsis has resolved.  Cultures negative so far. -Status post treatment with cefepime for 7 days.  Also received Zithromax for 5 days.  Completed treatment.  New diagnosis of squamous cell carcinoma of larynx Acute respiratory failure with hypoxia and severe dysphagia. -Status post tracheostomy, direct laryngoscopy and biopsy. -Was transferred briefly to ICU but has been subsequently transferred out of ICU -Currently on 5 L oxygen via tracheostomy.  Wean off as able.  Pulmonary toileting.  Family learning. -Trach care as per ENT.  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 Feng/oncology appreciated: She will set up radiation oncology follow-up as well.  Outpatient  follow-up with oncology/radiation oncology -Status post G-tube placement by IR on 04/24/2020.  Continue bolus tube feeds as per dietary recommendations.  Acute kidney injury/electrolyte abnormalities and refeeding syndrome/severe protein calorie malnutrition: -Resolved with hydration -Electrolytes normalized. -Increasing dose of tube feeding arranged at home.   Patient medically stabilized.  Currently on trach collar on 5 L oxygen is stable.  Tolerating PEG tube feeding without any problems. Patient's wife and sister able to learn trach care, feeding instructions and demonstrated knowledge at the bedside. Family and patient also agrees that surgery is best option for him and wants to talk about surgery in the next meeting with their cancer doctor. Eager to go home today.  Stable for discharge.  Short-term pain medication sent to the pharmacy.   Discharge Diagnoses:  Active Problems:   Sepsis ([place])   AKI (acute kidney injury) ([place])   Dehydration   CAP (community acquired pneumonia)   Laryngeal mass   Hyponatremia   Weight loss   Hypokalemia   Malnutrition ([facility])   Tobacco abuse   Hypophosphatemia   Hypomagnesemia   Protein-calorie malnutrition, severe   Laryngeal cancer [facility])    Discharge Instructions  Discharge Instructions    Increase activity slowly   Complete by: As directed    Increase activity slowly   Complete by: As directed      Allergies as of 05/02/2020   No Known Allergies     Medication List    TAKE these medications   acetaminophen 325 MG tablet Commonly known as: TYLENOL Take 2 tablets (650 mg total) by mouth every 6 (six) hours as needed for mild pain (or Fever >/= 101).   feeding supplement (OSMOLITE 1.5 CAL) Liqd Place 474 mLs into feeding tube 4 (four) times daily.  HYDROcodone-acetaminophen 5-325 MG tablet Commonly known as: NORCO/VICODIN Place 1 tablet into feeding tube every 6 (six) hours as needed for up to 5 days for moderate pain.    nicotine 21 mg/24hr patch Commonly known as: NICODERM CQ - dosed in mg/24 hours Place 1 patch (21 mg total) onto the skin daily.   senna-docusate 8.6-50 MG tablet Commonly known as: Senokot-S Place 1 tablet into feeding tube 2 (two) times daily.            Durable Medical Equipment  (From admission, onward)         Start     Ordered   05/02/20 1206  For home use only DME Tube feeding  Once       Comments: 8 carton of Osmolite 1.5 - 4 times per day (8a, 12p, 4p, 8p) - total of 8 cartons daily   05/02/20 1214   05/02/20 1145  For home use only DME 3 n 1  Once        05/02/20 1144   05/02/20 1144  For home use only DME Hospital bed  Once       Question Answer Comment  Length of Need 6 Months   Patient has (list medical condition): trach and peg tube that requires head of bed to be elevated   The above medical condition requires: Patient requires the ability to reposition frequently   Head must be elevated greater than: 30 degrees   Bed type Semi-electric   Support Surface: Gel Overlay      05/02/20 1144   05/02/20 1144  For home use only DME Walker rolling  Once       Question Answer Comment  Walker: With [place]   Patient needs a walker to treat with the following condition Decreased functional mobility and endurance      05/02/20 1144   04/30/20 1608  For home use only DME Trach supplies  (For Home Use Only DME Trach Supplies)  Once       Question Answer Comment  Trach Type [place]iley   Cuffed or Uncuffed Uncuffed   Fenestrated No   Size 6   Back Up Trach Type Shiley   Cuffed or Uncuffed Uncuffed   Fenestrated No   Size 6   Trach Supplies/Equipment for Home Use Medical Suction Machine   Trach Supplies/Equipment for Home Use Suction Catheters   Trach Supplies/Equipment for Home Use Tracheostomy Care Cleaning Kits   Trach Supplies/Equipment for Home Use Tube Holder/Collar   Trach Supplies/Equipment for Home Use Speaking Valve   Trach Supplies/Equipment for Home  Use Tracheostomy Drain Dressings   Trach Supplies/Equipment for Home Use Humidification (oxygen 5 liters via trach collar to provide specified % - if tracheostomy is capped with occlusive cap during day, a separate  order will need to be provided)   Suction Catheter Size 14 French (for size 6 or higher)   Cleaning Kits 60      04/30/20 1610   04/28/20 1549  For home use only DME Tube feeding pump  Once       Question:  Length of Need  Answer:  Lifetime   04/28/20 1548          No Known Allergies  Consultations:  Oncology  Intervention radiology for PEG tube  ENT tracheostomy and biopsy.   Procedures/Studies: CT ABDOMEN WO CONTRAST  Result Date: 04/23/2020 CLINICAL DATA:  Assess anatomy for gastrostomy tube placement EXAM: CT ABDOMEN WITHOUT CONTRAST TECHNIQUE: Multidetector CT imaging of  the abdomen was performed following the standard protocol without IV contrast. COMPARISON:  CT chest 04/18/2020 FINDINGS: Lower chest: Trace bilateral pleural effusions. Bilateral lower lobe atelectasis with superimposed peripheral consolidation in a peribronchovascular distribution. In the setting of diffuse lower lobe bronchial wall thickening, findings are concerning for aspiration. More mild peribronchovascular ground-glass attenuation opacities in the inferior aspect of the middle lobe and lingula. The visualized cardiac structures are normal in size. No pericardial effusion. Hepatobiliary: Normal hepatic contour and morphology. No discrete hepatic lesion. High density material present within the gallbladder lumen likely representing sludge and/or small stones. No biliary ductal dilatation. Pancreas: Unremarkable. No pancreatic ductal dilatation or surrounding inflammatory changes. Spleen: Normal in size without focal abnormality. Peripheral linear calcification along the splenic margin. Adrenals/Urinary Tract: Unremarkable adrenal glands and kidneys. No hydronephrosis or nephrolithiasis.  Stomach/Bowel: Gastric tube is present. The tip of the gastric tube is in the proximal duodenum. Normal gastric anatomy. There is mild colonic interposition at the splenic flexure. Excellent visualization of the colon on the scout radiograph. Vascular/Lymphatic: Limited evaluation in the absence of intravenous contrast. Aortic atherosclerosis is present. No aneurysm. No suspicious lymphadenopathy. Other: No abdominal wall hernia or abnormality. Musculoskeletal: No acute fracture or aggressive appearing lytic or blastic osseous lesion. IMPRESSION: 1. Anatomy is suitable for percutaneous gastrostomy tube placement. 2. Probable aspiration changes in the bilateral lower lobes, and to a lesser extent in the inferior middle lobe and lingula. 3. Trace bilateral pleural effusions. 4. The tip of the gastric tube is in the proximal duodenum. 5. Sludge and/or small stones in the gallbladder. 6.  Aortic Atherosclerosis (ICD10-170.0). Electronically Signed   By: Jacqulynn Cadet M.D.   On: 04/23/2020 07:05   CT SOFT TISSUE NECK WO CONTRAST  Result Date: 04/18/2020 CLINICAL DATA:  Fatigue, cough and weakness EXAM: CT NECK WITHOUT CONTRAST TECHNIQUE: Multidetector CT imaging of the neck was performed following the standard protocol without intravenous contrast. COMPARISON:  None. FINDINGS: Pharynx and larynx: There is a mildly hyperdense focus at the posterior aspect of the left vocal fold with associated soft tissue thickening (3:95). The epiglottis is normal. Pharynx is normal. Salivary glands: No inflammation, mass, or stone. Thyroid: Normal Lymph nodes: None enlarged or abnormal density. Vascular: Negative. Limited intracranial: Negative. Visualized orbits: Negative. Mastoids and visualized paranasal sinuses: Mild left maxillary mucosal disease. Skeleton: No acute or aggressive process. Upper chest: Hazy opacities in the left apex. Mild emphysema. Other: None. IMPRESSION: 1. Mildly hyperdense focus at the posterior aspect  of the left vocal fold with associated soft tissue thickening. 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 Correlation with direct visualization is recommended. 2. Hazy opacities in the left apex, which may indicate infection. Emphysema (ICD10-J43.9). Electronically Signed   By: Ulyses Jarred M.D.   On: 04/18/2020 00:28   CT CHEST WO CONTRAST  Result Date: 04/18/2020 CLINICAL DATA:  Follow-up abnormal chest x-ray EXAM: CT CHEST WITHOUT CONTRAST TECHNIQUE: Multidetector CT imaging of the chest was performed following the standard protocol without IV contrast. COMPARISON:  Chest x-ray from the previous day. FINDINGS: Cardiovascular: Somewhat limited due to lack of IV contrast. Mild atherosclerotic calcifications are noted in the thoracic aorta. No aneurysmal dilatation is noted. No cardiac enlargement is seen. Mild coronary calcifications are noted. The pulmonary artery as visualized appears within normal limits. Mediastinum/Nodes: Thoracic inlet shows some mild fullness in the region of the vocal cords better evaluated on recent CT of the neck. Thyroid is within normal limits. Scattered small mediastinal lymph nodes are noted. The  esophagus is within normal limits. Lungs/Pleura: Lungs are well aerated bilaterally with a combination of fine ground-glass opacities as well as more nodular tree-in-bud changes throughout the lower lobes left greater than right. These changes are consistent with diffuse pneumonia. There are worsened in the interval from the prior chest x-ray. Possibility of superimposed aspiration would deserve consideration as well as there is considerable inspissated material within the bronchial tree particularly in the left lower lobe. Upper Abdomen: Visualized upper abdomen shows no acute abnormality. Calcification along the margin of the spleen is noted. This may be related to prior trauma. Musculoskeletal: Degenerative changes of the thoracic spine are noted. IMPRESSION: Diffuse bilateral pulmonary  parenchymal changes consistent with multifocal pneumonia. Possibility of superimposed aspiration deserves consideration as well given inspissated material within the lower lobe bronchial tree particularly on the left. Correlate clinically as to any immunocompromised state. Fullness in the region of the vocal cords incompletely evaluated on this exam. Aortic Atherosclerosis (ICD10-I70.0). Electronically Signed   By: Inez Catalina M.D.   On: 04/18/2020 00:20   IR GASTROSTOMY TUBE MOD SED  Result Date: 04/24/2020 INDICATION: History of head neck cancer. Please perform percutaneous gastrostomy tube placement for enteric nutrition supplementation purposes. EXAM: PULL TROUGH GASTROSTOMY TUBE PLACEMENT COMPARISON:  CT abdomen and pelvis-04/22/2020 MEDICATIONS: Ancef 2 gm IV; Antibiotics were administered within 1 hour of the procedure. Glucagon 1 mg IV CONTRAST:  20 mL of Omnipaque 300 administered into the gastric lumen. ANESTHESIA/SEDATION: Moderate (conscious) sedation was employed during this procedure. A total of Versed 1 mg and Fentanyl 50 mcg was administered intravenously. Moderate Sedation Time: 35 minutes. The patient's level of consciousness and vital signs were monitored continuously by radiology nursing throughout the procedure under my direct supervision. FLUOROSCOPY TIME:  6 minutes (21 mGy) COMPLICATIONS: None immediate. PROCEDURE: Informed written consent was obtained from the patient following explanation of the procedure, risks, benefits and alternatives. A time out was performed prior to the initiation of the procedure. Ultrasound scanning was performed to demarcate the edge of the left lobe of the liver. Maximal barrier sterile technique utilized including caps, mask, sterile gowns, sterile gloves, large sterile drape, hand hygiene and Betadine prep. The left upper quadrant was sterilely prepped and draped. An oral gastric catheter was inserted into the stomach under fluoroscopy. The existing  nasogastric feeding tube was removed. The left costal margin and air opacified transverse colon were identified and avoided. Air was injected into the stomach for insufflation and visualization under fluoroscopy. Under sterile conditions a 17 gauge trocar needle was utilized to access the stomach percutaneously beneath the left subcostal margin after the overlying soft tissues were anesthetized with 1% Lidocaine with epinephrine. Needle position was confirmed within the stomach with aspiration of air and injection of small amount of contrast. A single T tack was deployed for gastropexy. Over an Amplatz guide wire, a 9-French sheath was inserted into the stomach. A snare device was utilized to capture the oral gastric catheter. The snare device was pulled retrograde from the stomach up the esophagus and out the oropharynx. The 20-French pull-through gastrostomy was connected to the snare device and pulled antegrade through the oropharynx down the esophagus into the stomach and then through the percutaneous tract external to the patient. The gastrostomy was assembled externally. Contrast injection confirms position in the stomach. Several spot radiographic images were obtained in various obliquities for documentation. The patient tolerated procedure well without immediate post procedural complication. FINDINGS: After successful fluoroscopic guided placement, the gastrostomy tube is appropriately positioned  with internal disc against the ventral aspect of the gastric lumen. IMPRESSION: Successful fluoroscopic insertion of a 20-French pull-through gastrostomy tube. The gastrostomy may be used immediately for medication administration and in 24 hrs for the initiation of feeds. Electronically Signed   By: Sandi Mariscal M.D.   On: 04/24/2020 12:17   DG Chest Port 1 View  Result Date: 04/22/2020 CLINICAL DATA:  55 year old male status post tracheostomy placement. EXAM: PORTABLE CHEST 1 VIEW COMPARISON:  Chest radiograph  dated 04/17/2020 and CT dated 09/17/2020 FINDINGS: Tracheostomy with tip approximately 4.5 cm above the carina. Enteric tube extends below the diaphragm with tip beyond the margin of the image. Bilateral pulmonary nodularity primarily involving the lower lung fields in keeping with known pneumonia. No significant pleural effusion. No pneumothorax. No acute osseous pathology. Old healed fracture deformity of the left humeral neck. IMPRESSION: Tracheostomy above the carina. Electronically Signed   By: Anner Crete M.D.   On: 04/22/2020 17:47   DG Chest Port 1 View  Result Date: 04/17/2020 CLINICAL DATA:  Weight loss cough EXAM: PORTABLE CHEST 1 VIEW COMPARISON:  None. FINDINGS: Hyperinflation. Ill-defined left greater than right reticulonodular and ground-glass opacity. No pleural effusion. Normal heart size. No pneumothorax. IMPRESSION: Ill-defined left greater than right reticulonodular and ground-glass opacity, suspicious for bilateral pneumonia, potentially atypical or viral pneumonia. Electronically Signed   By: Donavan Foil M.D.   On: 04/17/2020 19:14   DG ABD ACUTE 2+V W 1V CHEST  Result Date: 04/26/2020 CLINICAL DATA:  Abdominal pain EXAM: DG ABDOMEN ACUTE WITH 1 VIEW CHEST COMPARISON:  Chest radiograph April 22, 2020; CT abdomen April 22, 2020. FINDINGS: AP chest: Tracheostomy catheter tip is 5.3 cm above the carina. No pneumothorax. There are layering pleural effusions on each side with bibasilar atelectatic change. Apparent postoperative changes noted in the right base region. Heart is upper normal in size with pulmonary vascularity normal. No adenopathy. There is a healed fracture of the proximal left humerus with remodeling. Supine and upright abdomen: Gastrostomy catheter in region of stomach. There is no bowel dilatation or air-fluid level to suggest bowel obstruction. No free air. Vascular calcifications are noted in the pelvis. IMPRESSION: Gastrostomy catheter in region of stomach. No  evident bowel obstruction or free air. Ill-defined opacity consistent with effusions and atelectasis in the lung bases. No frank consolidation. Stable cardiac silhouette. Electronically Signed   By: Lowella Grip III M.D.   On: 04/26/2020 10:08   DG Swallowing Func-Speech Pathology  Result Date: 04/25/2020 Objective Swallowing Evaluation: Type of Study: MBS-Modified Barium Swallow Study  Patient Details Name: Channin Agustin MRN: [id number] Date of Birth: 1965-05-20 Today's Date: 04/25/2020 Time: SLP Start Time (ACUTE ONLY): 1100 -SLP Stop Time (ACUTE ONLY): 1130 SLP Time Calculation (min) (ACUTE ONLY): 30 min Past Medical History: Past Medical History: Diagnosis Date . Seizures ([place])  Past Surgical History: Past Surgical History: Procedure Laterality Date . DIRECT LARYNGOSCOPY N/A 04/22/2020  Procedure: DIRECT LARYNGOSCOPY WITH BIOPSY;  Surgeon: [name], MD;  Location: [place];  Service: ENT;  Laterality: N/A; . ESOPHAGOSCOPY  04/22/2020  Procedure: ESOPHAGOSCOPY;  Surgeon: [name], MD;  Location: [place];  Service: ENT;; . IR GASTROSTOMY TUBE MOD SED  04/24/2020 . TRACHEOSTOMY TUBE PLACEMENT N/A 04/22/2020  Procedure: TRACHEOSTOMY;  Surgeon: [name], MD;  Location: [facility] OR;  Service: ENT;  Laterality: N/A; HPI: Pt is a 55 y.o. male with medical history significant of EtOH abuse,  tobacco abuse, remote hx of seizures. He presented via EMS due to inability  to get out of bed, weight loss, and productive cough x4 months and increased difficulty swallowing. CXR 3/30: Ill-defined left greater than right reticulonodular and ground-glass opacity, suspicious for bilateral pneumonia, potentially atypical or viral pneumonia. CT soft tissue: Mildly hyperdense focus at the posterior aspect of the left vocal fold with associated soft tissue thickening. 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 Laryngoscopy 3/31: Oropharynx and hypopharynx were mostly clear although the entire endolarynx is replaced by a  papillomatous appearing tumor mass that spills over into the medial aspect of the pyriforms. airway is partially compromised.Trach recommended.  Pt was to have trach placed on Friday 4/1 but it was cancelled, is now tentatively scheduled for [place] 4/4.  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  There were no recognizable structures.  It spilled over along the lateral pharyngeal wall on the right side.  The left piriform sinus appeared to be clear."  Subjective: -- (alert, talking with PMV placed intermittently) Assessment / Plan / Recommendation CHL IP CLINICAL IMPRESSIONS 04/25/2020 Clinical Impression Pt participated in brief MBS - PMV placed for one of only two boluses given due to the level of impairment and severe aspiration. He demonstrated a profound dysphagia with no ability to close the laryngeal vestibule.  There was marginal movement of the hyoid, no epiglottic inversion, no approximation of the arytenoids to epiglottic base, and no identifiable onset of the pharyngeal swallow.  One bolus each of nectar and thin liquid was observed to spill immediately into the larynx, eliciting a cough, and pt was able to move some of the bolus back into the oral cavity for suctioning. What remained of the bolus was immediately aspirated, a portion of which was ejected from the trach tube.  Mr. Krantz watched the video in real time - we discussed his swallowing deficits and his inability to eat safely or comfortably at this time.  SLP will follow for further education.  Continue NPO with PEG for nutrition. SLP Visit Diagnosis Dysphagia, pharyngeal phase (R13.13) Attention and concentration deficit following -- Frontal lobe and executive function deficit following -- Impact on safety and function Severe aspiration risk   CHL IP TREATMENT RECOMMENDATION 04/25/2020  Treatment Recommendations Therapy as outlined in treatment plan below   No flowsheet data found. CHL IP DIET RECOMMENDATION 04/25/2020 SLP Diet Recommendations NPO Liquid Administration via -- Medication Administration -- Compensations -- Postural Changes --   CHL IP OTHER RECOMMENDATIONS 04/25/2020 Recommended Consults -- Oral Care Recommendations Oral care QID Other Recommendations Have oral suction available   CHL IP FOLLOW UP RECOMMENDATIONS 04/25/2020 Follow up Recommendations Other (comment)   CHL IP FREQUENCY AND DURATION 04/25/2020 Speech Therapy Frequency (ACUTE ONLY) min 2x/week Treatment Duration 2 weeks      CHL IP ORAL PHASE 04/25/2020 Oral Phase WFL Oral - Pudding Teaspoon -- Oral - Pudding Cup -- Oral - Honey Teaspoon -- Oral - Honey Cup -- Oral - Nectar Teaspoon -- Oral - Nectar Cup -- Oral - Nectar Straw -- Oral - Thin Teaspoon -- Oral - Thin Cup -- Oral - Thin Straw -- Oral - Puree -- Oral - Mech Soft -- Oral - Regular -- Oral - Multi-Consistency -- Oral - Pill -- Oral Phase - Comment --  CHL IP PHARYNGEAL PHASE 04/25/2020 Pharyngeal Phase Impaired Pharyngeal- Pudding Teaspoon -- Pharyngeal -- Pharyngeal- Pudding Cup -- Pharyngeal -- Pharyngeal- Honey Teaspoon -- Pharyngeal --  Pharyngeal- Honey Cup -- Pharyngeal -- Pharyngeal- Nectar Teaspoon Reduced epiglottic inversion;Reduced anterior laryngeal mobility;Reduced laryngeal elevation;Reduced airway/laryngeal closure;Penetration/Aspiration before swallow;Significant aspiration (Amount);Pharyngeal residue - pyriform;Pharyngeal residue - valleculae Pharyngeal Material enters airway, passes BELOW cords and not ejected out despite cough attempt by patient Pharyngeal- Nectar Cup -- Pharyngeal -- Pharyngeal- Nectar Straw -- Pharyngeal -- Pharyngeal- Thin Teaspoon -- Pharyngeal -- Pharyngeal- Thin Cup -- Pharyngeal -- Pharyngeal- Thin Straw Reduced epiglottic inversion;Reduced anterior laryngeal mobility;Reduced laryngeal elevation;Reduced airway/laryngeal  closure;Penetration/Aspiration before swallow;Significant aspiration (Amount);Pharyngeal residue - pyriform;Pharyngeal residue - valleculae Pharyngeal Material enters airway, passes BELOW cords and not ejected out despite cough attempt by patient Pharyngeal- Puree -- Pharyngeal -- Pharyngeal- Mechanical Soft -- Pharyngeal -- Pharyngeal- Regular -- Pharyngeal -- Pharyngeal- Multi-consistency -- Pharyngeal -- Pharyngeal- Pill -- Pharyngeal -- Pharyngeal Comment --  No flowsheet data found. Juan Quam Laurice 04/25/2020, 11:50 AM              US Abdomen Limited RUQ (LIVER/GB)  Result Date: 04/18/2020 CLINICAL DATA:  Alcohol abuse. EXAM: ULTRASOUND ABDOMEN LIMITED RIGHT UPPER QUADRANT COMPARISON:  None. FINDINGS: Gallbladder: Gallbladder sludge fills the majority of the lumen of the gallbladder. No gallstones or wall thickening visualized. No pericholecystic fluid. No sonographic Murphy sign noted by sonographer. Common bile duct: Diameter: 3 mm. Liver: No focal lesion identified. Within normal limits in parenchymal echogenicity. Portal vein is patent on color Doppler imaging with normal direction of blood flow towards the liver. Other: None. IMPRESSION: Gallbladder sludge fills the majority of the lumen of the gallbladder. No associate findings of acute cholecystitis or choledocholithiasis. Electronically Signed   By: Iven Finn M.D.   On: 04/18/2020 02:16    (Echo, Carotid, EGD, Colonoscopy, ERCP)    Subjective: Patient seen and examined.  His wife and sister at the bedside.  Denies any complaints.  Is very happy and looking forward to go home.   Discharge Exam: Vitals:   05/02/20 0746 05/02/20 1229  BP: 98/67 117/66  Pulse: 78 81  Resp: 20 15  Temp: 97.8 F (36.6 C) 97.8 F (36.6 C)  SpO2: 99% 100%   Vitals:   05/02/20 0016 05/02/20 0500 05/02/20 0746 05/02/20 1229  BP:   98/67 117/66  Pulse: 81  78 81  Resp: 18  20 15   Temp:   97.8 F (36.6 C) 97.8 F (36.6 C)  TempSrc:   Oral  Oral  SpO2: 97%  99% 100%  Weight:  54.1 kg    Height:        General: Pt is alert, awake, not in acute distress Currently on trach collar.  On 5 L oxygen.  Looks comfortable and delighted to go home. Cardiovascular: RRR, S1/S2 +, no rubs, no gallops Respiratory: CTA bilaterally, no wheezing, no rhonchi, some conducted upper airway sounds from tracheostomy. Abdominal: Soft, NT, ND, bowel sounds +, PEG tube site is clean and intact. Extremities: no edema, no cyanosis    The results of significant diagnostics from this hospitalization (including imaging, microbiology, ancillary and laboratory) are listed below for reference.     Microbiology: Recent Results (from the past 240 hour(s))  MRSA PCR Screening     Status: None   Collection Time: 04/23/20  4:15 PM   Specimen: Nasopharyngeal  Result Value Ref Range Status   MRSA by PCR NEGATIVE NEGATIVE Final    Comment:        The GeneXpert MRSA Assay (FDA approved for NASAL specimens only), is one component of a comprehensive MRSA colonization surveillance program.  It is not intended to diagnose MRSA infection nor to guide or monitor treatment for MRSA infections. Performed at [facility], [place] [address] [place],  [address]      Labs: BNP (last 3 results) No results for input(s): BNP in the last 8760 hours. Basic Metabolic Panel: Recent Labs  Lab 04/26/20 0950 04/26/20 1626 04/27/20 0258 04/28/20 0109 04/29/20 0116 05/01/20 0524  NA  --   --  133* 133* 135 130*  K  --   --  5.0 3.9 4.2 3.7  CL  --   --  98 98 98 93*  CO2  --   --  26 27 28 31   GLUCOSE  --   --  116* 118* 162* 91  BUN  --   --  15 25* 33* 21*  CREATININE  --   --  0.47* 0.49* 0.50* 0.52*  CALCIUM  --   --  8.5* 8.7* 8.8* 9.0  MG 1.3* 2.1 2.0 1.7 1.8 1.7  PHOS 3.4 3.3 3.7  --   --   --    Liver Function Tests: Recent Labs  Lab 04/27/20 0258 04/29/20 0116  AST 18 29  ALT 11 19  ALKPHOS 62 58  BILITOT 0.9 0.5  PROT 6.0* 6.5   ALBUMIN 2.2* 2.1*   No results for input(s): LIPASE, AMYLASE in the last 168 hours. No results for input(s): AMMONIA in the last 168 hours. CBC: Recent Labs  Lab 04/27/20 0258 04/28/20 0109 04/29/20 0116 05/01/20 1043  WBC 15.4* 15.4* 10.3 7.6  NEUTROABS 13.0* 13.1* 8.0* 4.6  HGB 13.1 13.2 13.0 14.4  HCT 38.5* 40.1 39.0 44.3  MCV 91.4 91.8 93.5 91.5  PLT 274 284 286 391   Cardiac Enzymes: No results for input(s): CKTOTAL, CKMB, CKMBINDEX, TROPONINI in the last 168 hours. BNP: Invalid input(s): POCBNP CBG: Recent Labs  Lab 05/01/20 1956 05/01/20 2250 05/02/20 0341 05/02/20 0743 05/02/20 1141  GLUCAP 87 129* 97 81 114*   D-Dimer No results for input(s): DDIMER in the last 72 hours. Hgb A1c No results for input(s): HGBA1C in the last 72 hours. Lipid Profile No results for input(s): CHOL, HDL, LDLCALC, TRIG, CHOLHDL, LDLDIRECT in the last 72 hours. Thyroid function studies No results for input(s): TSH, T4TOTAL, T3FREE, THYROIDAB in the last 72 hours.  Invalid input(s): FREET3 Anemia work up No results for input(s): VITAMINB12, FOLATE, FERRITIN, TIBC, IRON, RETICCTPCT in the last 72 hours. Urinalysis    Component Value Date/Time   COLORURINE AMBER (A) 04/17/2020 2133   APPEARANCEUR CLOUDY (A) 04/17/2020 2133   LABSPEC 1.014 04/17/2020 2133   PHURINE 5.0 04/17/2020 2133   GLUCOSEU NEGATIVE 04/17/2020 2133   HGBUR SMALL (A) 04/17/2020 2133   BILIRUBINUR NEGATIVE 04/17/2020 2133   KETONESUR NEGATIVE 04/17/2020 2133   PROTEINUR NEGATIVE 04/17/2020 2133   NITRITE NEGATIVE 04/17/2020 2133   LEUKOCYTESUR NEGATIVE 04/17/2020 2133   Sepsis Labs Invalid input(s): PROCALCITONIN,  WBC,  LACTICIDVEN Microbiology Recent Results (from the past 240 hour(s))  MRSA PCR Screening     Status: None   Collection Time: 04/23/20  4:15 PM   Specimen: Nasopharyngeal  Result Value Ref Range Status   MRSA by PCR NEGATIVE NEGATIVE Final    Comment:        The GeneXpert MRSA Assay  (FDA approved for NASAL specimens only), is one component of a comprehensive MRSA colonization surveillance program. It is not intended to diagnose MRSA infection nor to guide or monitor treatment for MRSA infections.  Performed at [facility], [place] [address] [place], [place] [address]      Time coordinating discharge:  35 minutes  SIGNED:   Barb Merino, MD  [facility] 05/02/2020, 1:26 PM

## 2020-05-02 NOTE — Progress Notes (Signed)
Physical Therapy Discharge Patient Details Name: Mario Peters MRN: [id number] DOB: 11-Feb-1965 Today's Date: 05/02/2020 Time: 7195-9747 PT Time Calculation (min) (ACUTE ONLY): 20 min  Patient discharged from PT services secondary to goals met and no further PT needs identified.  Please see latest therapy progress note for current level of functioning and progress toward goals.    Progress and discharge plan discussed with patient and/or caregiver: Patient/Caregiver agrees with plan  GP     Alvira Philips 05/02/2020, 1:06 PM  Voris Tigert M,PT Acute Rehab Services [phone] (pager)

## 2020-05-02 NOTE — TOC Transition Note (Addendum)
Transition of Care [facility]) - CM/SW Discharge Note   Patient Details  Name: Mario Peters MRN: [id number] Date of Birth: [birth year]  Transition of [facility]) CM/SW Contact:  Bartholomew Crews, RN Phone Number: [phone] 05/02/2020, 1:12 PM   Clinical Narrative:     Notified by MD of patient's readiness to transition home. Noted previous TOC team members had initiated DME for home. Spoke with Thedore Mins at [facility] - signed trach order faxed to [place]. Spoke with patient's sister, [name]. Patient will transition home to his sister Cathy's home in [place]. Mary requested additional DME [hospital bed, walker, 3N1] - orders placed - [employer] aware - delivery in process for today. Suction already delivered to bedside, and humidification to be delivered to room. Feeding supplies to be delivered today and additional teaching to be done. Trach supplies ordered with anticipated delivery next week - Nursing to send 7 days of trach supplies home with patient at discharge. Family to transport home. [name] stated that patient has a new patient appointment scheduled to set up with PCP. Message left with [facility] private duty nursing program of patient transitioning home. No further TOC needs identified at this time.   Final next level of care: Home w Home Health Services Barriers to Discharge: No Barriers Identified   Patient Goals and CMS Choice Patient states their goals for this hospitalization and ongoing recovery are:: dc home to sister, Cathy's home CMS Medicare.gov Compare Post Acute Care list provided to:: Patient Choice offered to / list presented to : NA  Discharge Placement                       Discharge Plan and Services   Discharge Planning Services: CM Consult Post Acute Care Choice: [place]          DME Arranged: 3-N-1,Hospital bed,Walker,Trach supplies,Suction,Tube feeding DME Agency: [employer] Date DME Agency Contacted: 05/02/20 Time DME Agency Contacted:  0600 Representative spoke with at DME Agency: [place]: [place] ([place] Medicaid private duty nursing) [place]: [place]        Social Determinants of Health (SDOH) Interventions     Readmission Risk Interventions No flowsheet data found.

## 2020-05-02 NOTE — Progress Notes (Signed)
Physical Therapy Treatment Patient Details Name: Mario Peters MRN: [id number] DOB: December 10, 1965 Today's Date: 05/02/2020    History of Present Illness Pt is an 55 y.o. male. Admitted 04/22/20 with difficulty with swallowing and weight loss (increased over the past 4 months) dehydration and hypotension as well as infection (pneumonia). 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  Tracheostomy tube (04/22/2020); Direct laryngoscopy (04/22/2020); and esophagoscopy (04/22/2020). PMH includes: Long history of smoking and ETOH, seizures, low health care literacy.    PT Comments    Pt admitted with above diagnosis. Pt was able to ambulate with supervision with RW and met all goals.  Family to assist pt at home. Will not need any further skilled PT.    Follow Up Recommendations  Supervision for mobility/OOB;No PT follow up     Equipment Recommendations  Rolling walker with 5" wheels;3in1 (PT)    Recommendations for Other Services       Precautions / Restrictions Precautions Precautions: Fall Precaution Comments: trach Restrictions Weight Bearing Restrictions: No    Mobility  Bed Mobility Overal bed mobility: Modified Independent             General bed mobility comments: increased time and effort, HOB elevated    Transfers Overall transfer level: Needs assistance Equipment used: None Transfers: Sit to/from Stand Sit to Stand: Supervision         General transfer comment: supervision for lines and safety  Ambulation/Gait Ambulation/Gait assistance: Supervision Gait Distance (Feet): 200 Feet Assistive device: Rolling walker (2 wheeled) Gait Pattern/deviations: Step-through pattern;Trunk flexed Gait velocity: slow and appropriate for situation   General Gait Details: shortened steps with slower pace (and wider BOS).  Encouraged pt to use RW at home as he is steady with this and family agrees.   Stairs             Wheelchair Mobility    Modified Rankin  (Stroke Patients Only)       Balance Overall balance assessment: Needs assistance Sitting-balance support: No upper extremity supported;Feet supported Sitting balance-[name]: Good     Standing balance support: No upper extremity supported;During functional activity Standing balance-[name]: Fair Standing balance comment: statically, can stand without UE support but dynamically needs UE support                            Cognition Arousal/Alertness: Awake/alert Behavior During Therapy: WFL for tasks assessed/performed Overall Cognitive Status: Within Functional Limits for tasks assessed                                 General Comments: donned PMV with pt communicating needs effectively and expressing desire to DC home      Exercises General Exercises - Lower Extremity Ankle Circles/Pumps: AROM;Both;5 reps;Supine Long Arc Quad: AROM;Both;10 reps;Seated    General Comments General comments (skin integrity, edema, etc.): Pt on RA with PMV when ambulating per RT.  VSS.  Pt on medical air at rest for humidity.      Pertinent Vitals/Pain Pain Assessment: No/denies pain Pain Intervention(s): Limited activity within patient's tolerance;Monitored during session    Home Living                      Prior Function            PT Goals (current goals can now be found in the care plan section) Acute  Rehab PT Goals Patient Stated Goal: go home PT Goal Formulation: All assessment and education complete, DC therapy Progress towards PT goals: Goals met/education completed, patient discharged from PT    Frequency    Min 3X/week      PT Plan Current plan remains appropriate    Co-evaluation              AM-PAC PT "6 Clicks" Mobility   Outcome Measure  Help needed turning from your back to your side while in a flat bed without using bedrails?: None Help needed moving from lying on your back to sitting on the side of a flat bed  without using bedrails?: None Help needed moving to and from a bed to a chair (including a wheelchair)?: None Help needed standing up from a chair using your arms (e.g., wheelchair or bedside chair)?: None Help needed to walk in hospital room?: None Help needed climbing 3-5 steps with a railing? : A Little 6 Click Score: 23    End of Session Equipment Utilized During Treatment: Gait belt Activity Tolerance: Patient tolerated treatment well Patient left: with call bell/phone within reach;in bed;with family/visitor present Nurse Communication: Mobility status PT Visit Diagnosis: Difficulty in walking, not elsewhere classified (R26.2)     Time: 1483-0735 PT Time Calculation (min) (ACUTE ONLY): 20 min  Charges:  $Gait Training: 8-22 mins                     Elizah Mierzwa M,PT Acute Rehab Services [phone] (pager)   Alvira Philips 05/02/2020, 1:03 PM

## 2020-05-02 NOTE — Progress Notes (Signed)
    Durable Medical Equipment  (From admission, onward)         Start     Ordered   05/02/20 1145  For home use only DME 3 n 1  Once        05/02/20 1144   05/02/20 1144  For home use only DME Hospital bed  Once       Question Answer Comment  Length of Need 6 Months   Patient has (list medical condition): trach and peg tube that requires head of bed to be elevated   The above medical condition requires: Patient requires the ability to reposition frequently   Head must be elevated greater than: 30 degrees   Bed type Semi-electric   Support Surface: Gel Overlay      05/02/20 1144   05/02/20 1144  For home use only DME Walker rolling  Once       Question Answer Comment  Walker: With [place]   Patient needs a walker to treat with the following condition Decreased functional mobility and endurance      05/02/20 1144   04/30/20 1608  For home use only DME Trach supplies  (For Home Use Only DME Trach Supplies)  Once       Question Answer Comment  Trach Type [place]   Cuffed or Uncuffed Uncuffed   Fenestrated No   Size 6   Back Up Trach Type Shiley   Cuffed or Uncuffed Uncuffed   Fenestrated No   Size 6   Trach Supplies/Equipment for Home Use Medical Suction Machine   Trach Supplies/Equipment for Home Use Suction Catheters   Trach Supplies/Equipment for Home Use Tracheostomy Care Cleaning Kits   Trach Supplies/Equipment for Home Use Tube Holder/Collar   Trach Supplies/Equipment for Home Use Speaking Valve   Trach Supplies/Equipment for Home Use Tracheostomy Drain Dressings   Trach Supplies/Equipment for Home Use Humidification (oxygen 5 liters via trach collar to provide specified % - if tracheostomy is capped with occlusive cap during day, a separate [place] order will need to be provided)   Suction Catheter Size 14 French (for size 6 or higher)   Cleaning Kits 60      04/30/20 1610   04/28/20 1549  For home use only DME Tube feeding pump  Once       Question:  Length of Need   Answer:  Lifetime   04/28/20 1548   04/28/20 1546  For home use only DME Tube feeding  Once       Comments: Osmolite 1.2 at 20 ml/hr goal rate of 70 ml/hr (1680 ml per day) Prosource TF 45 ml BID  Provides 2096 kcal, 115 gm protein, 1361 ml free water daily.  If/when transitioning to bolus feeds via PEG: 1.5 carton of Osmolite 1.5 - 4 times per day (8a, 12p, 4p, 8p) - total of 6 cartons daily Prosource TF 45 ml daily   04/28/20 1547

## 2020-05-08 ENCOUNTER — Encounter: Payer: Self-pay | Admitting: Radiation Oncology

## 2020-05-08 DIAGNOSIS — C320 Malignant neoplasm of glottis: Secondary | ICD-10-CM | POA: Insufficient documentation

## 2020-05-10 ENCOUNTER — Telehealth (HOSPITAL_COMMUNITY): Payer: Self-pay

## 2020-05-13 ENCOUNTER — Other Ambulatory Visit: Payer: Self-pay

## 2020-05-13 ENCOUNTER — Other Ambulatory Visit (HOSPITAL_COMMUNITY): Payer: Self-pay

## 2020-05-13 ENCOUNTER — Ambulatory Visit (INDEPENDENT_AMBULATORY_CARE_PROVIDER_SITE_OTHER): Payer: Self-pay | Admitting: Dentistry

## 2020-05-13 VITALS — BP 125/71 | HR 83 | Temp 98.2°F

## 2020-05-13 DIAGNOSIS — K045 Chronic apical periodontitis: Secondary | ICD-10-CM

## 2020-05-13 DIAGNOSIS — K029 Dental caries, unspecified: Secondary | ICD-10-CM

## 2020-05-13 DIAGNOSIS — C329 Malignant neoplasm of larynx, unspecified: Secondary | ICD-10-CM

## 2020-05-13 DIAGNOSIS — F40232 Fear of other medical care: Secondary | ICD-10-CM

## 2020-05-13 DIAGNOSIS — K0530 Chronic periodontitis, unspecified: Secondary | ICD-10-CM

## 2020-05-13 DIAGNOSIS — K08109 Complete loss of teeth, unspecified cause, unspecified class: Secondary | ICD-10-CM

## 2020-05-13 DIAGNOSIS — K011 Impacted teeth: Secondary | ICD-10-CM

## 2020-05-13 DIAGNOSIS — R634 Abnormal weight loss: Secondary | ICD-10-CM

## 2020-05-13 DIAGNOSIS — Z01818 Encounter for other preprocedural examination: Secondary | ICD-10-CM

## 2020-05-13 DIAGNOSIS — K083 Retained dental root: Secondary | ICD-10-CM

## 2020-05-13 MED ORDER — CHLORHEXIDINE GLUCONATE 0.12 % MT SOLN
15.0000 mL | Freq: Two times a day (BID) | OROMUCOSAL | 0 refills | Status: AC
  Filled 2020-05-13: qty 473, 16d supply, fill #0

## 2020-05-13 NOTE — Progress Notes (Signed)
Department of Dental Medicine     OUTPATIENT CONSULTATION  Service Date:   05/13/2020  Patient Name:  Mario Peters Date of Birth:   [birth year] Medical Record Number: [id number]  Referring Provider:             Eppie Gibson, MD   PLAN & RECOMMENDATIONS   > There are no current signs of acute dental infection including abscess, edema or erythema, or suspicious lesion requiring biopsy.  The patient does have severe decay, retained root tips, periodontal disease and multiple teeth with chronic periapical infections. >> Recommend full mouth extractions to decrease the risk of perioperative and postoperative systemic infection and/or other complications. >>> Plan to discuss with medical team and coordinate treatment as needed.   >>  Recommend the patient establish care at a dental office of their choice for routine dental care including replacement of missing teeth and exams. >>  Discussed in detail all treatment options with the patient and they are agreeable to the plan.   Thank you for consulting with [facility] and for the opportunity to participate in this patient's treatment.  Should you have any questions or concerns, please contact the [facility] at [phone].  05/13/2020      CONSULT NOTE   COVID 19 SCREENING: The patient denies symptoms concerning for COVID-19 infection including fever, chills, cough, or newly developed shortness of breath.   HISTORY OF PRESENT ILLNESS: >> Mario Peters is a very pleasant 55 y.o. male with h/o AKI, long-standing tobacco use, hyponatremia, malnutrition and hyperkalemia who was recently diagnosed with laryngeal cancer and is anticipating head and neck radiation therapy.  He has already lost a significant amount of weight and had a tracheostomy while he was admitted to the hospital 2 weeks ago.  Per his wife, he is still anticipating more surgery with Dr. Constance Holster.  He also needs to see medical and radiation oncology and  PET scan.  The patient presents today with his wife for a medically necessary dental consultation as part of their pre-radiation work-up.  DENTAL HISTORY: > The patient reports it has been since he was a kid since he has last seen a dentist.  Per his wife, he likes to use mouthrinses (particuarly Peridex) to clean his teeth.  He currently denies any dental/orofacial pain or sensitivity. >> Patient is able to manage oral secretions.  Patient denies fever, rigors and malaise.   CHIEF COMPLAINT:  Here for a pre-head and neck radiation dental evaluation.   Patient Active Problem List   Diagnosis Date Noted  . 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 Laryngeal cancer ([place])   . Protein-calorie malnutrition, severe 04/20/2020  . Hypophosphatemia 04/19/2020  . Hypomagnesemia 04/19/2020  . AKI (acute kidney injury) ([place]) 04/18/2020  . Dehydration 04/18/2020  . CAP (community acquired pneumonia) 04/18/2020  . 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 Hyponatremia 04/18/2020  . Weight loss 04/18/2020  . Hypokalemia 04/18/2020  . Malnutrition ([place]) 04/18/2020  . Tobacco abuse 04/18/2020  . Sepsis ([place]) 04/17/2020   Past Medical History:  Diagnosis Date  . Seizures ([place])    Past Surgical History:  Procedure Laterality Date  . DIRECT LARYNGOSCOPY N/A 04/22/2020   Procedure: DIRECT LARYNGOSCOPY WITH BIOPSY;  Surgeon: [name], MD;  Location: [facility];  Service: ENT;  Laterality: N/A;  . ESOPHAGOSCOPY  04/22/2020   Procedure: ESOPHAGOSCOPY;  Surgeon: [name], MD;  Location: [place] [facility];  Service: ENT;;  . IR GASTROSTOMY TUBE MOD SED  04/24/2020  . TRACHEOSTOMY TUBE PLACEMENT  N/A 04/22/2020   Procedure: TRACHEOSTOMY;  Surgeon: [name], MD;  Location: [facility];  Service: ENT;  Laterality: N/A;   No Known Allergies Current Outpatient Medications  Medication Sig Dispense Refill  . acetaminophen (TYLENOL) 325 MG tablet Take 2 tablets (650 mg total) by mouth every 6 (six) hours as needed for mild pain (or Fever >/=  101).    . nicotine (NICODERM CQ - DOSED IN MG/24 HOURS) 21 mg/24hr patch Place 1 patch (21 mg total) onto the skin daily. 28 patch 0  . Nutritional Supplements (FEEDING SUPPLEMENT, OSMOLITE 1.5 CAL,) LIQD Place 474 mLs into feeding tube 4 (four) times daily.  0  . senna-docusate (SENOKOT-S) 8.6-50 MG tablet Place 1 tablet into feeding tube 2 (two) times daily. 60 tablet 0   No current facility-administered medications for this visit.    LABS: Lab Results  Component Value Date   WBC 7.6 05/01/2020   HGB 14.4 05/01/2020   HCT 44.3 05/01/2020   MCV 91.5 05/01/2020   PLT 391 05/01/2020      Component Value Date/Time   NA 130 (L) 05/01/2020 0524   K 3.7 05/01/2020 0524   CL 93 (L) 05/01/2020 0524   CO2 31 05/01/2020 0524   GLUCOSE 91 05/01/2020 0524   BUN 21 (H) 05/01/2020 0524   CREATININE 0.52 (L) 05/01/2020 0524   CALCIUM 9.0 05/01/2020 0524   GFRNONAA >60 05/01/2020 0524   Lab Results  Component Value Date   INR 1.4 (H) 04/17/2020   No results found for: PTT  Social History   Socioeconomic History  . Marital status: Single    Spouse name: Not on file  . Number of children: Not on file  . Years of education: Not on file  . Highest education level: Not on file  Occupational History  . Not on file  Tobacco Use  . Smoking status: Current Every Day Smoker  . Smokeless tobacco: Never Used  Vaping Use  . Vaping Use: Never used  Substance and Sexual Activity  . Alcohol use: Not Currently  . Drug use: Never  . Sexual activity: Not on file  Other Topics Concern  . Not on file  Social History Narrative  . Not on file   Social Determinants of Health   Financial Resource Strain: Not on file  Food Insecurity: Not on file  Transportation Needs: Not on file  Physical Activity: Not on file  Stress: Not on file  Social Connections: Not on file  Intimate Partner Violence: Not on file   Family History  Problem Relation Age of Onset  . Hypertension Other      REVIEW OF SYSTEMS: Reviewed with the patient as per HPI. PSYCH: (+) Dental phobia   VITAL SIGNS: BP 125/71 (BP Location: Right Arm)   Pulse 83   Temp 98.2 F (36.8 C) (Oral)    PHYSICAL EXAM: >> General:  Well-developed, comfortable and in no apparent distress. >> Neurological:  Alert and oriented to person, place and  time. >> Extraoral:  Facial symmetry present without any edema or erythema.  No swelling or lymphadenopathy.  TMJ asymptomatic without clicks or crepitations. (+) Tracheostomy. >> Intraoral:  Soft tissues appear well-perfused and mucous membranes moist.  FOM and vestibules soft and not raised. Oral cavity without mass or lesion. No signs of infection, parulis, sinus tract, edema or erythema evident upon exam.   DENTAL EXAM: Hard tissue exam completed and charted.  >> Dentition:  Overall poor remaining dentition.  Missing teeth, rampant  caries, retained root tips.   >> The patient is maintaining poor oral hygiene.  >> Periodontal: Inflamed and erythematous gingival tissue. Generalized plaque accumulation. >> Caries: Rampant decay in all 4 quadrants  >> Retained Root Tips: #3, #4, #5, #6, #14, #20, #21, #22, #24, #25, #27, #28 and #31. >> Removable/Fixed Prosthodontics: Patient denies wearing partial dentures. >> Occlusion: Unable to assess molar occlusion.  Non-functional and supra-erupted teeth #13, #15, #16 and #32.   RADIOGRAPHIC EXAM: PAN and Periapical images (x4) exposed and interpreted.  >> Condyles seated bilaterally in fossas.  No evidence of abnormal pathology.  All visualized osseous structures appear WNL. #15 and #32 supra-erupted and mesially drifting. #1 impacted with crown of tooth present facing superiorly and no apparent root anatomy. #20, #21, #22, #24, #25, #27, #28 and #31 periapical radiolucencies.  >> Generalized severe horizontal bone loss consistent with severe periodontitis.   >> Missing teeth, retained root tips- #3, #4, #5, #6 and #14.  #13 severe decay approximating the pulp. #3, #4, #5, #6, #13 and #14 have periapical radiolucencies.   ASSESSMENT:  1. Laryngeal cancer 2. Preoperative dental consultation 3. Missing teeth 4. Caries 5. 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 Impacted tooth 9. Weight loss 10. Dental Phobia   PROCEDURES: 1. The common and significant side effects of radiation therapy to the head and neck were explained and discussed with the patient.  The discussion included side effects of trismus (limited opening), dysgeusia (loss of taste), xerostomia (dry mouth), radiation caries and osteoradionecrosis of the jaw.  I also discussed the importance of maintaining optimal oral hygiene and oral health before, during and after radiation to decrease the risk of developing radiation cavities and the need for any surgery such as extractions after therapy.   PLAN AND RECOMMENDATIONS: >  I discussed the risks, benefits, and complications of various scenarios with the patient in relationship to their medical and dental conditions, which included systemic infection such as osteoradionecrosis that could potentially occur either before, during or after their anticipated radiation therapy if dental/oral concerns are not addressed.  I explained that if any chronic or acute dental/oral infection(s) are addressed and subsequently not maintained following medical optimization and recovery, their risk of the previously mentioned complications are just as high and could potentially occur postoperatively.  I explained all significant findings of the dental consultation with the patient including severe decay on all remaining teeth, chronic infections and periodontal disease and the recommended care including extractions of all remaining teeth in order to optimize them for head and neck radiation from a dental standpoint.  The patient verbalized understanding of all findings, discussion, and  recommendations. >>  We then discussed various treatment options to include no treatment, multiple extractions with alveoloplasty, pre-prosthetic surgery as indicated, periodontal therapy, dental restorations, root canal therapy, crown and bridge therapy, implant therapy, and replacement of missing teeth as indicated.  The patient verbalized understanding of all options, and currently wishes to proceed with full mouth extractions with alveoloplasty as needed in the operating room under general anesthesia due to his high aspiration risk, extent of treatment needed and dental phobia. >>>  Plan to discuss all findings and recommendations with medical team and coordinate future care as needed.  Will need to plan extractions hopefully prior to starting radiation and will find out if/when he is anticipating surgery.  <> The patient tolerated today's visit well.  All questions and concerns were addressed and answered, and the patient departed in  stable condition.   I spent in excess of 120 minutes during the conduct of this consultation and >50% of this time involved direct face-to-face encounter for counseling and/or coordination of the patient's care. [place] Benson [place], D.M.D.

## 2020-05-13 NOTE — Progress Notes (Incomplete)
Head and Neck Cancer Location of Tumor / Histology: *** Squamous cell carcinoma of larynx, p16(-)  Patient presented with symptoms of: weakness, dysphagia, hoarseness, cough, anorexia and weight loss for ~4 months. Patient brought to ED on 04/17/2020 for work up and CT of neck and chest showed:  --IMPRESSION: (neck) 1. Mildly hyperdense focus at the posterior aspect of the left vocal fold with associated soft tissue thickening. 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 Correlation with direct visualization is recommended. 2. Hazy opacities in the left apex, which may indicate infection. --IMPRESSION: (chest) 1. Diffuse bilateral pulmonary parenchymal changes consistent with multifocal pneumonia. Possibility of superimposed aspiration deserves consideration as well given inspissated material within the lower lobe bronchial tree particularly on the left. Correlate clinically as to any immunocompromised state. 2. Fullness in the region of the vocal cords incompletely evaluated on this exam.  Biopsies revealed:  04/22/2020 FINAL MICROSCOPIC DIAGNOSIS:  A. ESOPHAGUS, UPPER CERVICAL, BIOPSY:  - Benign squamous mucosa  - Negative for dysplasia or malignancy  B. LARYNX, BIOPSY:  - Invasive moderately to poorly differentiated squamous cell carcinoma.  See comment  COMMENT:  B. Immunohistochemical stains show that the tumor cells are positive for CK7, p63 and CK 5/6, consistent with above interpretation. Immunostain for CK20 is negative. ADDENDUM:  B.  Immunohistochemical stain for p16 is negative in the tumor cells.   Nutrition Status Yes No Comments  Weight changes? []  []    Swallowing concerns? []  []    PEG? [x]  []  Placed 04/24/2020   Referrals Yes No Comments  Social Work? [x]  []    Dentistry? [x]  []    Swallowing therapy? [x]  []    Nutrition? [x]  []    Med/Onc? [x]  []  Dr. Arletha Pili Iruku   Safety Issues Yes No Comments  Prior radiation? []  []    Pacemaker/ICD? []  []    Possible current  pregnancy? []  [x]  N/A  Is the patient on methotrexate? []  []     Tobacco/Marijuana/Snuff/ETOH use: ***  Past/Anticipated interventions by otolaryngology, if any:  04/22/2020 Dr. [name] TRACHEOSTOMY, awake, direct laryngoscopy with biopsy, esophagoscopy with biopsy, under general anesthesia.  Past/Anticipated interventions by medical oncology, if any:  Under care of Dr. Arletha Pili Iruku 04/30/2020 -The patient has newly diagnosed laryngeal cancer. -Diagnosis discussed with the patient.  He has no significant cervical adenopathy or definitive distant metastasis on CT scan. -Likely locally advanced disease. -The option of surgery (total laryngectomy and lymph node dissection followed by adjuvant radiation) versus concurrent chemoradiation were discussed with the patient.  The patient is not interested in surgery. -Case to be presented at the head neck tumor conference. -The patient will need to see radiation oncology as an outpatient. -We will make arrangements in our office for the patient to begin concurrent chemoradiation in the very near future.  Scheduled to meet with Dr. Chryl Heck again tomorrow 05/15/2020    Current Complaints / other details:  ***PET scan scheduled for 05/22/2020

## 2020-05-13 NOTE — Patient Instructions (Signed)
[facility] Department of Dental Medicine Orrie Schubert B. Nicodemus Denk, D.M.D. Phone: [phone] Fax: [phone]   It was a pleasure seeing you today!  Please refer to the information below regarding your dental visit with us, and call us should you have any questions or concerns that may come up after you leave.   Thank you for giving us the opportunity to provide care for you.  If there is anything we can do for you, please let us know.    RADIATION THERAPY AND INFORMATION REGARDING YOUR TEETH   . XEROSTOMIA (DRY MOUTH):  Your salivary glands may be in the field of radiation.  Radiation may include all or only part of your salivary glands.  This will cause your saliva to dry up, and you will have a dry mouth.  The dry mouth will be for the rest of your life unless your radiation oncologist tells you otherwise.  Your saliva has many functions: 1. It wets your tongue for speaking. 2. It coats your teeth and the inside of your mouth for easier movement. 3. It helps with chewing and swallowing food. 4. It helps clean away harmful acid and toxic products made by the germs in your mouth, therefore it helps prevent cavities. 5. It kills some germs in your mouth and helps to prevent gum disease. 6. It helps to carry flavor to your taste buds.  Once you have lost your saliva, you will be at higher risk for tooth decay and gum disease.    What can be done to help improve your mouth when there's not enough saliva: >> Your dentist may give a recommendation for CLoSYS.  It will not bring back all of your saliva but may bring back some of it.  Also, your saliva may be thick and ropy or white and foamy.  It will not feel like it use to feel. >> You will need to swish with water every time your mouth feels dry.  YOU CANNOT suck on any cough drops, mints, lemon drops, candy, vitamin C or any other products.  You cannot use anything other than water to make your mouth feel less dry.  If you want to drink  anything else, you have to drink it all at once and brush afterwards.  Be sure to discuss the details of your diet habits with your dentist or hygienist.   . RADIATION CARIES:  This is decay (cavities) that happens very quickly once your mouth is very dry due to radiation therapy.  Normally, cavities take six months to two years to become a problem.  When you have dry mouth, cavities may take as little as eight weeks to cause you a problem.   >>  Dental check-ups every two months are necessary as long as you have a dry mouth. Radiation caries typically, but not always, start at your gum line where it is hard to see the cavity.  It is therefore also hard to fill these cavities adequately.  This high rate of cavities happens because your mouth no longer has saliva and therefore the acid made by the germs starts the decay process.  Whenever you eat anything the germs in your mouth change the food into acid.  The acid then burns a small hole in your tooth.  This small hole is the beginning of a cavity.  If this is not treated then it will grow bigger and become a cavity.  The way to avoid this hole getting bigger is to use fluoride every   evening as prescribed by your dentist following your radiation.   NOTE:  You have to make sure that your teeth are very clean before you use the fluoride.  This fluoride in turn will strengthen your teeth and prepare them for another day of fighting acid. >> If you develop radiation caries many times, the damage is so large that you will have to have all your teeth removed.  This could be a big problem if some of these teeth are in the field of radiation.  Further details of why this could be a big problem will follow (see Osteoradionecrosis below).   . DYSGEUSIA (LOSS OF TASTE):  This happens to varying degrees once you've had radiation therapy to your jaw region.  Many times taste is not completely lost, but becomes limited.  The loss of taste is mostly due to radiation  affecting your taste buds.  However, if you have no saliva in your mouth to carry the flavor to your taste buds, it would be difficult for your taste buds to taste anything.  That is why using water or a prescription for [place] prior to meals and during meal times may help with some of the taste.  Keep in mind that taste generally returns very slowly over the course of several months or several years after radiation therapy.  Don't give up hope.   . TRISMUS (LIMITED JAW OPENING):  According to your radiation oncologist, your TMJ or jaw joints are going to be partially or fully in the field of radiation.  This means that over time the muscles that help you open and close your mouth may get stiff.  This will potentially result in your not being able to open your mouth wide enough or as wide as you can open it now.    Let me give you an example of how slowly this happens and how unaware people are of it:   >>  A gentlemen that had radiation therapy two years ago came back to me complaining that bananas are just too large for him to be able to fit them in between his teeth.  He was not able to open wide enough to bite into a banana.  This happens slowly and over a period of time.  What we do to try and prevent this:   1. Your dentist will probably give you a stack of sticks called a trismus exercise device.  This stack will help remind your muscles and your jaw joints to open up to the same distance every day.  Use these sticks every morning when you wake up, or according to the instructions given by your dentist.    2. You must use these sticks for at least one to two years after radiation therapy.  The reason for that is because it happens so slowly and keeps going on for about two years after radiation therapy.  Your hospital dentist will help you monitor your mouth opening and make sure that it's not getting smaller after radiation.  Trismus Exercises: >>  Using the stack of sticks given to you by your  dentist, place the stack in your mouth and hold onto the other end for support. >>  Leave the sticks in your mouth while holding the other end.  Allow 30 seconds for muscle stretching. >>  Rest for a few seconds. >>  Repeat 3-5 times. >>  This exercise is recommended in the mornings and evenings unless otherwise instructed. >>  The exercise should be   done for a period of 2 YEARS after the end of radiation. >>  Your maximum jaw opening should be checked routinely at recall dental visits by your general dentist. >>  You should report any changes, soreness, or difficulties encountered when doing the exercises to your dentist.   . OSTEORADIONECROSIS (ORN):  This is a condition where your jaw bone after radiation therapy becomes very dry.  It has very little blood supply to keep it alive.  If you develop a cavity that turns into an abscess or an infection, then the jaw bone does not have enough blood supply to help fight the infection.  At this point it is very likely that the infection could cause the death of your jaw bone.  When you have dead bone it has to be removed.  Therefore, you might end up having to have surgery to remove part of your jaw bone, the part of the jaw bone that has been affected.     >>  Healing is also a problem if you are to have surgery (like a tooth extraction) in the areas where the bone has had radiation therapy.  If you have surgery, you need more blood supply to heal which is not available.  When blood supply and oxygen are not available, there is a chance for the bone to die. >>  Occasionally, ORN happens on its own with no obvious reason, but this is quite rare.  We believe that patients who continue to smoke and/or drink alcohol have a higher chance of having this problem. >>  Once your jaw bone has had radiation therapy, if there are any remaining teeth in that area, it is not recommended to have them pulled unless your dentist or oral surgeon is aware of your history of  radiation and believes it is safe.  >>  The risks for ORN either from infection or spontaneously occurring (with no reason) are life long.   QUESTIONS?  Call our office during office hours [phone].    

## 2020-05-14 ENCOUNTER — Ambulatory Visit: Payer: Medicaid Other | Admitting: Radiation Oncology

## 2020-05-14 ENCOUNTER — Ambulatory Visit: Payer: Medicaid Other

## 2020-05-15 ENCOUNTER — Telehealth: Payer: Self-pay | Admitting: Hematology and Oncology

## 2020-05-15 ENCOUNTER — Other Ambulatory Visit: Payer: Self-pay

## 2020-05-15 ENCOUNTER — Inpatient Hospital Stay: Payer: Medicaid Other | Attending: Hematology and Oncology | Admitting: Hematology and Oncology

## 2020-05-15 ENCOUNTER — Encounter: Payer: Self-pay | Admitting: Hematology and Oncology

## 2020-05-15 DIAGNOSIS — R634 Abnormal weight loss: Secondary | ICD-10-CM | POA: Diagnosis not present

## 2020-05-15 DIAGNOSIS — F17200 Nicotine dependence, unspecified, uncomplicated: Secondary | ICD-10-CM | POA: Diagnosis not present

## 2020-05-15 DIAGNOSIS — C329 Malignant neoplasm of larynx, unspecified: Secondary | ICD-10-CM | POA: Diagnosis not present

## 2020-05-15 DIAGNOSIS — J189 Pneumonia, unspecified organism: Secondary | ICD-10-CM | POA: Diagnosis not present

## 2020-05-15 DIAGNOSIS — Z430 Encounter for attention to tracheostomy: Secondary | ICD-10-CM | POA: Diagnosis not present

## 2020-05-15 NOTE — Assessment & Plan Note (Signed)
Likely from malignancy. Will refer to nutrition. Encouraged G tube feedings for now, since he was thought to be a risk for aspiration.

## 2020-05-15 NOTE — Assessment & Plan Note (Signed)
Cough and SOB are better Will evaluate the status of pneumonia on PET imaging Clinically he is improving

## 2020-05-15 NOTE — Telephone Encounter (Signed)
Scheduled follow-up appointment per 4/27 los. Patient is aware. 

## 2020-05-15 NOTE — Progress Notes (Signed)
Mario Peters CONSULT NOTE  Patient Care Team: Patient, [name] as PCP - General (General Practice)  CHIEF COMPLAINTS/PURPOSE OF CONSULTATION:  Laryngeal SCC.  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 He was seen by surgery in the hospital and recommendation was to consider total laryngectomy vs CRT. PET staging pending Given his improving PS and possibility of organ preservation, I would lean towards CRT if no evidence of distant metastatic disease We discussed about weekly cisplatin, discussed adverse effects including fatigue, nausea, vomiting, myelosuppression , nephrotoxicity, ototoxicity and neuropathy> He understands that some of the side effects could be permanent. He is agreeable and will consider all recommendations. He will meet Dr Isidore Moos to discuss recommendations as well. RTC with me in 2 weeks  Weight loss, unintentional Likely from malignancy. Will refer to nutrition. Encouraged G tube feedings for now, since he was thought to be a risk for aspiration.  CAP (community acquired pneumonia) Cough and SOB are better Will evaluate the status of pneumonia on PET imaging Clinically he is improving  Orders Placed This Encounter  Procedures  . Ambulatory Referral to [facility]    Referral Priority:   Routine    Referral Type:   Consultation    Referral Reason:   Specialty Services Required    Number of Visits Requested:   1  . Trach visit    Standing Status:   Future    Standing Expiration Date:   05/15/2021     HISTORY OF PRESENTING ILLNESS:  Mario Peters 55 y.o. male is here because of newly diagnosed laryngeal SCC  Mario Peters is a 55 year old male with a past medical history significant for alcohol tobacco abuse, and history of seizures.   The patient presented with severe dysphagia, cough, fatigue.  He has had significant weight loss recently and hoarseness of his voice.  Symptoms present for approximately 4 months.  CT of the chest on admission showed diffuse bilateral pulmonary parenchymal changes consistent with multifocal pneumonia possible related to aspiration.  CT of the neck showed mildly hyperdense focus at the posterior aspect of the left vocal fold with associated soft tissue thickening which could be laryngeal neoplasm.  He was seen by ENT and flex fiberoptic lower discopathy was performed showing a large partially obstructing laryngeal tumor.  He was taken to the OR on 04/22/2020 underwent tracheostomy and biopsy.  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  P16 negative.  He is status post G-tube placement by IR on 04/24/2020.  He was initially seen in the hospital. Recommendation was to proceed with outpatient PET CT and return to follow up with medical oncology outpatient. He is here to follow up outpatient, arrived in a wheel chair with his sister According to sister, he is doing really well. He recently even took a bike, does everything at home, no one can stop him Using G tube, 6-8 cans of osmolite 1.5 a day.Lost about 11 lbs since last visit. He also notices occasional greenish sputum from trach, but no worsening SOB, fevers or chills. Constipation likely from osmolite He will be seeing Dr Isidore Moos next week, PET scheduled for next week. Rest of the pertinent 10 point ROS reviewed and negative.  REVIEW OF SYSTEMS:   Constitutional: Denies fevers, chills or abnormal night sweats Eyes: Denies blurriness  of vision, double vision or watery eyes Ears, nose, mouth, throat, and face: Denies mucositis or sore throat Respiratory: Denies cough, dyspnea or wheezes Cardiovascular: Denies palpitation, chest discomfort or lower extremity swelling Gastrointestinal:  Denies nausea,  heartburn or change in bowel habits Skin: Denies abnormal skin rashes Lymphatics: Denies new lymphadenopathy or easy bruising Neurological:Denies numbness, tingling or new weaknesses Behavioral/Psych: Mood is stable, no new changes  All other systems were reviewed with the patient and are negative.  MEDICAL HISTORY:  Past Medical History:  Diagnosis Date  . Seizures ([place])     SURGICAL HISTORY: Past Surgical History:  Procedure Laterality Date  . DIRECT LARYNGOSCOPY N/A 04/22/2020   Procedure: DIRECT LARYNGOSCOPY WITH BIOPSY;  Surgeon: [name], MD;  Location: [place];  Service: ENT;  Laterality: N/A;  . ESOPHAGOSCOPY  04/22/2020   Procedure: ESOPHAGOSCOPY;  Surgeon: [name], MD;  Location: [place];  Service: ENT;;  . IR GASTROSTOMY TUBE MOD SED  04/24/2020  . TRACHEOSTOMY TUBE PLACEMENT N/A 04/22/2020   Procedure: TRACHEOSTOMY;  Surgeon: [name], MD;  Location: [place];  Service: ENT;  Laterality: N/A;    SOCIAL HISTORY: Social History   Socioeconomic History  . Marital status: Single    Spouse name: Not on file  . Number of children: Not on file  . Years of education: Not on file  . Highest education level: Not on file  Occupational History  . Not on file  Tobacco Use  . Smoking status: Current Every Day Smoker  . Smokeless tobacco: Never Used  Vaping Use  . Vaping Use: Never used  Substance and Sexual Activity  . Alcohol use: Not Currently  . Drug use: Never  . Sexual activity: Not on file  Other Topics Concern  . Not on file  Social History Narrative  . Not on file   Social Determinants of Health   Financial Resource Strain: Not on file  Food Insecurity: Not on file  Transportation Needs: Not on file  Physical Activity: Not on file  Stress: Not on file  Social Connections: Not on file  Intimate Partner Violence: Not on file    FAMILY HISTORY: Family History  Problem Relation Age of Onset  . Hypertension Other     ALLERGIES:  has No Known  Allergies.  MEDICATIONS:  Current Outpatient Medications  Medication Sig Dispense Refill  . acetaminophen (TYLENOL) 325 MG tablet Take 2 tablets (650 mg total) by mouth every 6 (six) hours as needed for mild pain (or Fever >/= 101).    . chlorhexidine (PERIDEX) 0.12 % solution Rinse as directed with 2mls twice daily for 14 days. 473 mL 0  . nicotine (NICODERM CQ - DOSED IN MG/24 HOURS) 21 mg/24hr patch Place 1 patch (21 mg total) onto the skin daily. 28 patch 0  . Nutritional Supplements (FEEDING SUPPLEMENT, OSMOLITE 1.5 CAL,) LIQD Place 474 mLs into feeding tube 4 (four) times daily.  0  . senna-docusate (SENOKOT-S) 8.6-50 MG tablet Place 1 tablet into feeding tube 2 (two) times daily. 60 tablet 0   No current facility-administered medications for this visit.    PHYSICAL EXAMINATION:  ECOG PERFORMANCE STATUS: 1 - Symptomatic but completely ambulatory  Vitals:   05/15/20 1452  BP: 97/68  Pulse: 88  Resp: 17  Temp: (!) 97 F (36.1 C)  SpO2: 100%   Filed Weights   05/15/20 1452  Weight: 106 lb 11.2 oz (48.4 kg)    General: Awake and alert, no distress.  In wheel chair.  Eyes:  no scleral icterus.   ENT:  There were no oropharyngeal lesions.   Neck: Tracheostomy midline.  NO concern for infection Respiratory: Diminished breath sounds, no adv sounds today. Cardiovascular:  Regular rate and rhythm, S1/S2, without murmur, rub or gallop.  There was no pedal edema.   GI:  abdomen was soft, flat, nontender, nondistended, without organomegaly, G-tube present.  No evidence of infection around G tube Neuro exam was nonfocal. Patient was alert and oriented.  Attention was good.   Language was appropriate.  Mood was normal without depression.  Speech was not pressured.  Thought content was not tangential.    LABORATORY DATA:  I have reviewed the data as listed Lab Results  Component Value Date   WBC 7.6 05/01/2020   HGB 14.4 05/01/2020   HCT 44.3 05/01/2020   MCV 91.5 05/01/2020    PLT 391 05/01/2020     Chemistry      Component Value Date/Time   NA 130 (L) 05/01/2020 0524   K 3.7 05/01/2020 0524   CL 93 (L) 05/01/2020 0524   CO2 31 05/01/2020 0524   BUN 21 (H) 05/01/2020 0524   CREATININE 0.52 (L) 05/01/2020 0524      Component Value Date/Time   CALCIUM 9.0 05/01/2020 0524   ALKPHOS 58 04/29/2020 0116   AST 29 04/29/2020 0116   ALT 19 04/29/2020 0116   BILITOT 0.5 04/29/2020 0116       RADIOGRAPHIC STUDIES: I have personally reviewed the radiological images as listed and agreed with the findings in the report. CT ABDOMEN WO CONTRAST  Result Date: 04/23/2020 CLINICAL DATA:  Assess anatomy for gastrostomy tube placement EXAM: CT ABDOMEN WITHOUT CONTRAST TECHNIQUE: Multidetector CT imaging of the abdomen was performed following the standard protocol without IV contrast. COMPARISON:  CT chest 04/18/2020 FINDINGS: Lower chest: Trace bilateral pleural effusions. Bilateral lower lobe atelectasis with superimposed peripheral consolidation in a peribronchovascular distribution. In the setting of diffuse lower lobe bronchial wall thickening, findings are concerning for aspiration. More mild peribronchovascular ground-glass attenuation opacities in the inferior aspect of the middle lobe and lingula. The visualized cardiac structures are normal in size. No pericardial effusion. Hepatobiliary: Normal hepatic contour and morphology. No discrete hepatic lesion. High density material present within the gallbladder lumen likely representing sludge and/or small stones. No biliary ductal dilatation. Pancreas: Unremarkable. No pancreatic ductal dilatation or surrounding inflammatory changes. Spleen: Normal in size without focal abnormality. Peripheral linear calcification along the splenic margin. Adrenals/Urinary Tract: Unremarkable adrenal glands and kidneys. No hydronephrosis or nephrolithiasis. Stomach/Bowel: Gastric tube is present. The tip of the gastric tube is in the proximal  duodenum. Normal gastric anatomy. There is mild colonic interposition at the splenic flexure. Excellent visualization of the colon on the scout radiograph. Vascular/Lymphatic: Limited evaluation in the absence of intravenous contrast. Aortic atherosclerosis is present. No aneurysm. No suspicious lymphadenopathy. Other: No abdominal wall hernia or abnormality. Musculoskeletal: No acute fracture or aggressive appearing lytic or blastic osseous lesion. IMPRESSION: 1. Anatomy is suitable for percutaneous gastrostomy tube placement. 2. Probable aspiration changes in the bilateral lower lobes, and to a lesser extent in the inferior middle lobe and lingula. 3. Trace bilateral pleural effusions. 4. The tip of the gastric tube is in the proximal duodenum. 5. Sludge and/or small stones in the gallbladder. 6.  Aortic Atherosclerosis (ICD10-170.0). Electronically Signed   By: Jacqulynn Cadet M.D.   On: 04/23/2020 07:05   CT SOFT TISSUE NECK WO CONTRAST  Result Date: 04/18/2020 CLINICAL DATA:  Fatigue, cough and weakness EXAM: CT NECK WITHOUT CONTRAST TECHNIQUE: Multidetector CT imaging of the neck was performed following the standard protocol without intravenous contrast. COMPARISON:  None. FINDINGS: Pharynx and larynx: There is a mildly hyperdense focus at the posterior aspect of the left vocal fold with associated soft tissue thickening (3:95). The epiglottis is normal. Pharynx is normal. Salivary glands: No inflammation, mass, or stone. Thyroid: Normal Lymph nodes: None enlarged or abnormal density. Vascular: Negative. Limited intracranial: Negative. Visualized orbits: Negative. Mastoids and visualized paranasal sinuses: Mild left maxillary mucosal disease. Skeleton: No acute or aggressive process. Upper chest: Hazy opacities in the left apex. Mild emphysema. Other: None. IMPRESSION: 1. Mildly hyperdense focus at the posterior aspect of the left vocal fold with associated soft tissue thickening. This could be a  laryngeal neoplasm. Correlation with direct visualization is recommended. 2. Hazy opacities in the left apex, which may indicate infection. Emphysema (ICD10-J43.9). Electronically Signed   By: Ulyses Jarred M.D.   On: 04/18/2020 00:28   CT CHEST WO CONTRAST  Result Date: 04/18/2020 CLINICAL DATA:  Follow-up abnormal chest x-ray EXAM: CT CHEST WITHOUT CONTRAST TECHNIQUE: Multidetector CT imaging of the chest was performed following the standard protocol without IV contrast. COMPARISON:  Chest x-ray from the previous day. FINDINGS: Cardiovascular: Somewhat limited due to lack of IV contrast. Mild atherosclerotic calcifications are noted in the thoracic aorta. No aneurysmal dilatation is noted. No cardiac enlargement is seen. Mild coronary calcifications are noted. The pulmonary artery as visualized appears within normal limits. Mediastinum/Nodes: Thoracic inlet shows some mild fullness in the region of the vocal cords better evaluated on recent CT of the neck. Thyroid is within normal limits. Scattered small mediastinal lymph nodes are noted. The esophagus is within normal limits. Lungs/Pleura: Lungs are well aerated bilaterally with a combination of fine ground-glass opacities as well as more nodular tree-in-bud changes throughout the lower lobes left greater than right. These changes are consistent with diffuse pneumonia. There are worsened in the interval from the prior chest x-ray. Possibility of superimposed aspiration would deserve consideration as well as there is considerable inspissated material within the bronchial tree particularly in the left lower lobe. Upper Abdomen: Visualized upper abdomen shows no acute abnormality. Calcification along the margin of the spleen is noted. This may be related to prior trauma. Musculoskeletal: Degenerative changes of the thoracic spine are noted. IMPRESSION: Diffuse bilateral pulmonary parenchymal changes consistent with multifocal pneumonia. Possibility of  superimposed aspiration deserves consideration as well given inspissated material within the lower lobe bronchial tree particularly on the left. Correlate clinically as to any immunocompromised state. Fullness in the region of the vocal cords incompletely evaluated on this exam. Aortic Atherosclerosis (ICD10-I70.0). Electronically Signed   By: Inez Catalina M.D.   On: 04/18/2020 00:20   IR GASTROSTOMY TUBE MOD SED  Result Date: 04/24/2020 INDICATION: History of head neck cancer. Please perform percutaneous gastrostomy tube placement for enteric nutrition supplementation purposes. EXAM: PULL TROUGH GASTROSTOMY TUBE PLACEMENT COMPARISON:  CT abdomen and pelvis-04/22/2020 MEDICATIONS: Ancef 2 gm IV; Antibiotics were administered within 1 hour of the procedure. Glucagon 1 mg IV CONTRAST:  20 mL of Omnipaque 300 administered into the gastric lumen. ANESTHESIA/SEDATION: Moderate (conscious) sedation was employed during this procedure. A total of Versed 1 mg and Fentanyl 50 mcg was administered intravenously. Moderate Sedation Time: 35 minutes. The patient's level of consciousness and vital signs were monitored continuously by radiology nursing throughout the procedure under my direct supervision. FLUOROSCOPY TIME:  6 minutes (21 mGy) COMPLICATIONS:  None immediate. PROCEDURE: Informed written consent was obtained from the patient following explanation of the procedure, risks, benefits and alternatives. A time out was performed prior to the initiation of the procedure. Ultrasound scanning was performed to demarcate the edge of the left lobe of the liver. Maximal barrier sterile technique utilized including caps, mask, sterile gowns, sterile gloves, large sterile drape, hand hygiene and Betadine prep. The left upper quadrant was sterilely prepped and draped. An oral gastric catheter was inserted into the stomach under fluoroscopy. The existing nasogastric feeding tube was removed. The left costal margin and air opacified  transverse colon were identified and avoided. Air was injected into the stomach for insufflation and visualization under fluoroscopy. Under sterile conditions a 17 gauge trocar needle was utilized to access the stomach percutaneously beneath the left subcostal margin after the overlying soft tissues were anesthetized with 1% Lidocaine with epinephrine. Needle position was confirmed within the stomach with aspiration of air and injection of small amount of contrast. A single T tack was deployed for gastropexy. Over an Amplatz guide wire, a 9-French sheath was inserted into the stomach. A snare device was utilized to capture the oral gastric catheter. The snare device was pulled retrograde from the stomach up the esophagus and out the oropharynx. The 20-French pull-through gastrostomy was connected to the snare device and pulled antegrade through the oropharynx down the esophagus into the stomach and then through the percutaneous tract external to the patient. The gastrostomy was assembled externally. Contrast injection confirms position in the stomach. Several spot radiographic images were obtained in various obliquities for documentation. The patient tolerated procedure well without immediate post procedural complication. FINDINGS: After successful fluoroscopic guided placement, the gastrostomy tube is appropriately positioned with internal disc against the ventral aspect of the gastric lumen. IMPRESSION: Successful fluoroscopic insertion of a 20-French pull-through gastrostomy tube. The gastrostomy may be used immediately for medication administration and in 24 hrs for the initiation of feeds. Electronically Signed   By: Sandi Mariscal M.D.   On: 04/24/2020 12:17   DG Chest Port 1 View  Result Date: 04/22/2020 CLINICAL DATA:  55 year old male status post tracheostomy placement. EXAM: PORTABLE CHEST 1 VIEW COMPARISON:  Chest radiograph dated 04/17/2020 and CT dated 09/17/2020 FINDINGS: Tracheostomy with tip  approximately 4.5 cm above the carina. Enteric tube extends below the diaphragm with tip beyond the margin of the image. Bilateral pulmonary nodularity primarily involving the lower lung fields in keeping with known pneumonia. No significant pleural effusion. No pneumothorax. No acute osseous pathology. Old healed fracture deformity of the left humeral neck. IMPRESSION: Tracheostomy above the carina. Electronically Signed   By: Anner Crete M.D.   On: 04/22/2020 17:47   DG Chest Port 1 View  Result Date: 04/17/2020 CLINICAL DATA:  Weight loss cough EXAM: PORTABLE CHEST 1 VIEW COMPARISON:  None. FINDINGS: Hyperinflation. Ill-defined left greater than right reticulonodular and ground-glass opacity. No pleural effusion. Normal heart size. No pneumothorax. IMPRESSION: Ill-defined left greater than right reticulonodular and ground-glass opacity, suspicious for bilateral pneumonia, potentially atypical or viral pneumonia. Electronically Signed   By: Donavan Foil M.D.   On: 04/17/2020 19:14   DG ABD ACUTE 2+V W 1V CHEST  Result Date: 04/26/2020 CLINICAL DATA:  Abdominal pain EXAM: DG ABDOMEN ACUTE WITH 1 VIEW CHEST COMPARISON:  Chest radiograph April 22, 2020; CT abdomen April 22, 2020. FINDINGS: AP chest: Tracheostomy catheter tip is 5.3 cm above the carina. No pneumothorax. There are layering pleural effusions on each side with bibasilar atelectatic  change. Apparent postoperative changes noted in the right base region. Heart is upper normal in size with pulmonary vascularity normal. No adenopathy. There is a healed fracture of the proximal left humerus with remodeling. Supine and upright abdomen: Gastrostomy catheter in region of stomach. There is no bowel dilatation or air-fluid level to suggest bowel obstruction. No free air. Vascular calcifications are noted in the pelvis. IMPRESSION: Gastrostomy catheter in region of stomach. No evident bowel obstruction or free air. Ill-defined opacity consistent with  effusions and atelectasis in the lung bases. No frank consolidation. Stable cardiac silhouette. Electronically Signed   By: Lowella Grip III M.D.   On: 04/26/2020 10:08   DG Swallowing Func-Speech Pathology  Result Date: 04/25/2020 Objective Swallowing Evaluation: Type of Study: MBS-Modified Barium Swallow Study  Patient Details Name: Seabron Dillin MRN: [id number] Date of Birth: 07/23/1965 Today's Date: 04/25/2020 Time: SLP Start Time (ACUTE ONLY): 1100 -SLP Stop Time (ACUTE ONLY): 1130 SLP Time Calculation (min) (ACUTE ONLY): 30 min Past Medical History: Past Medical History: Diagnosis Date . Seizures ([place])  Past Surgical History: Past Surgical History: Procedure Laterality Date . DIRECT LARYNGOSCOPY N/A 04/22/2020  Procedure: DIRECT LARYNGOSCOPY WITH BIOPSY;  Surgeon: [name], MD;  Location: [place];  Service: ENT;  Laterality: N/A; . ESOPHAGOSCOPY  04/22/2020  Procedure: ESOPHAGOSCOPY;  Surgeon: [name], MD;  Location: [place];  Service: ENT;; . IR GASTROSTOMY TUBE MOD SED  04/24/2020 . TRACHEOSTOMY TUBE PLACEMENT N/A 04/22/2020  Procedure: TRACHEOSTOMY;  Surgeon: [name], MD;  Location: [facility];  Service: ENT;  Laterality: N/A; HPI: Pt is a 55 y.o. male with medical history significant of EtOH abuse,  tobacco abuse, remote hx of seizures. He presented via EMS due to inability to get out of bed, weight loss, and productive cough x4 months and increased difficulty swallowing. CXR 3/30: Ill-defined left greater than right reticulonodular and ground-glass opacity, suspicious for bilateral pneumonia, potentially atypical or viral pneumonia. CT soft tissue: Mildly hyperdense focus at the posterior aspect of the left vocal fold with associated soft tissue thickening. 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 airway is partially compromised.Trach recommended.  Pt was to have trach placed on Friday 4/1 but it was cancelled, is now tentatively scheduled for [place] 4/4.  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  There were no recognizable structures.  It spilled over along the lateral pharyngeal wall on the right side.  The left piriform sinus appeared to be clear."  Subjective: -- (alert, talking with PMV placed intermittently) Assessment / Plan / Recommendation CHL IP CLINICAL IMPRESSIONS 04/25/2020 Clinical Impression Pt participated in brief MBS - PMV placed for one of only two boluses given due to the level of impairment and severe aspiration. He demonstrated a profound dysphagia with no ability to close the laryngeal vestibule.  There was marginal movement of the hyoid, no epiglottic inversion, no approximation of the arytenoids to epiglottic base, and no identifiable onset of the pharyngeal swallow.  One bolus each of nectar and thin liquid was observed to spill immediately into the larynx, eliciting a cough, and pt was able to move some of the bolus back into the oral cavity for suctioning. What remained of the bolus was  immediately aspirated, a portion of which was ejected from the trach tube.  Mr. Dunagan watched the video in real time - we discussed his swallowing deficits and his inability to eat safely or comfortably at this time.  SLP will follow for further education.  Continue NPO with PEG for nutrition. SLP Visit Diagnosis Dysphagia, pharyngeal phase (R13.13) Attention and concentration deficit following -- Frontal lobe and executive function deficit following -- Impact on safety and function Severe aspiration risk   CHL IP TREATMENT RECOMMENDATION 04/25/2020 Treatment Recommendations Therapy as outlined in treatment plan below   No  flowsheet data found. CHL IP DIET RECOMMENDATION 04/25/2020 SLP Diet Recommendations NPO Liquid Administration via -- Medication Administration -- Compensations -- Postural Changes --   CHL IP OTHER RECOMMENDATIONS 04/25/2020 Recommended Consults -- Oral Care Recommendations Oral care QID Other Recommendations Have oral suction available   CHL IP FOLLOW UP RECOMMENDATIONS 04/25/2020 Follow up Recommendations Other (comment)   CHL IP FREQUENCY AND DURATION 04/25/2020 Speech Therapy Frequency (ACUTE ONLY) min 2x/week Treatment Duration 2 weeks      CHL IP ORAL PHASE 04/25/2020 Oral Phase WFL Oral - Pudding Teaspoon -- Oral - Pudding Cup -- Oral - Honey Teaspoon -- Oral - Honey Cup -- Oral - Nectar Teaspoon -- Oral - Nectar Cup -- Oral - Nectar Straw -- Oral - Thin Teaspoon -- Oral - Thin Cup -- Oral - Thin Straw -- Oral - Puree -- Oral - Mech Soft -- Oral - Regular -- Oral - Multi-Consistency -- Oral - Pill -- Oral Phase - Comment --  CHL IP PHARYNGEAL PHASE 04/25/2020 Pharyngeal Phase Impaired Pharyngeal- Pudding Teaspoon -- Pharyngeal -- Pharyngeal- Pudding Cup -- Pharyngeal -- Pharyngeal- Honey Teaspoon -- Pharyngeal -- Pharyngeal- Honey Cup -- Pharyngeal -- Pharyngeal- Nectar Teaspoon Reduced epiglottic inversion;Reduced anterior laryngeal mobility;Reduced laryngeal elevation;Reduced airway/laryngeal closure;Penetration/Aspiration before swallow;Significant aspiration (Amount);Pharyngeal residue - pyriform;Pharyngeal residue - valleculae Pharyngeal Material enters airway, passes BELOW cords and not ejected out despite cough attempt by patient Pharyngeal- Nectar Cup -- Pharyngeal -- Pharyngeal- Nectar Straw -- Pharyngeal -- Pharyngeal- Thin Teaspoon -- Pharyngeal -- Pharyngeal- Thin Cup -- Pharyngeal -- Pharyngeal- Thin Straw Reduced epiglottic inversion;Reduced anterior laryngeal mobility;Reduced laryngeal elevation;Reduced airway/laryngeal closure;Penetration/Aspiration before swallow;Significant aspiration  (Amount);Pharyngeal residue - pyriform;Pharyngeal residue - valleculae Pharyngeal Material enters airway, passes BELOW cords and not ejected out despite cough attempt by patient Pharyngeal- Puree -- Pharyngeal -- Pharyngeal- Mechanical Soft -- Pharyngeal -- Pharyngeal- Regular -- Pharyngeal -- Pharyngeal- Multi-consistency -- Pharyngeal -- Pharyngeal- Pill -- Pharyngeal -- Pharyngeal Comment --  No flowsheet data found. Juan Quam Laurice 04/25/2020, 11:50 AM              US Abdomen Limited RUQ (LIVER/GB)  Result Date: 04/18/2020 CLINICAL DATA:  Alcohol abuse. EXAM: ULTRASOUND ABDOMEN LIMITED RIGHT UPPER QUADRANT COMPARISON:  None. FINDINGS: Gallbladder: Gallbladder sludge fills the majority of the lumen of the gallbladder. No gallstones or wall thickening visualized. No pericholecystic fluid. No sonographic Murphy sign noted by sonographer. Common bile duct: Diameter: 3 mm. Liver: No focal lesion identified. Within normal limits in parenchymal echogenicity. Portal vein is patent on color Doppler imaging with normal direction of blood flow towards the liver. Other: None. IMPRESSION: Gallbladder sludge fills the majority of the lumen of the gallbladder. No associate findings of acute cholecystitis or choledocholithiasis. Electronically Signed   By: Iven Finn M.D.   On: 04/18/2020 02:16    All questions were answered. The patient knows to call the clinic with any problems, questions or concerns.  I spent 40 minutes in the care of this patient including H and P, review of records, counseling and coordination of care. Coordinated with our NN, sent referrals to nutrition, will refer to speech therapy. He also needed help with trach care, will arrnage for this.    Benay Pike, MD 05/15/2020 5:08 PM

## 2020-05-15 NOTE — Assessment & Plan Note (Signed)
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 He was seen by surgery in the hospital and recommendation was to consider total laryngectomy vs CRT. PET staging pending Given his improving PS and possibility of organ preservation, I would lean towards CRT if no evidence of distant metastatic disease We discussed about weekly cisplatin, discussed adverse effects including fatigue, nausea, vomiting, myelosuppression , nephrotoxicity, ototoxicity and neuropathy> He understands that some of the side effects could be permanent. He is agreeable and will consider all recommendations. He will meet Dr Isidore Moos to discuss recommendations as well. RTC with me in 2 weeks

## 2020-05-16 ENCOUNTER — Other Ambulatory Visit: Payer: Self-pay

## 2020-05-16 DIAGNOSIS — C320 Malignant neoplasm of glottis: Secondary | ICD-10-CM

## 2020-05-20 NOTE — Progress Notes (Signed)
Head and Neck Cancer Location of Tumor / Histology:  Squamous cell carcinoma of larynx, p16(-)  Patient presented with symptoms of: on 04/17/2020 was brought to the ED due to sever dysphasia, cough, fatigue, and inability to get out of bed (all of which he had been experiencing for the past 4-5 months, became increasingly worse). He was admitted for dehydration, hypotension, and infection. CT scan w/o contrast revealed a laryngeal mass 04/17/2020 --IMPRESSION: 1. Mildly hyperdense focus at the posterior aspect of the left vocal fold with associated soft tissue thickening. 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 Correlation with direct visualization is recommended. 2. Hazy opacities in the left apex, which may indicate infection.  Biopsies revealed: 04/22/2020 FINAL MICROSCOPIC DIAGNOSIS:  A. ESOPHAGUS, UPPER CERVICAL, BIOPSY:  - Benign squamous mucosa  - Negative for dysplasia or malignancy  B. LARYNX, BIOPSY:  - Invasive moderately to poorly differentiated squamous cell carcinoma.  See comment  COMMENT:  B. Immunohistochemical stains show that the tumor cells are positive for CK7, p63 and CK 5/6, consistent with above interpretation. Immunostain for CK20 is negative. ADDENDUM:  B.  Immunohistochemical stain for p16 is negative in the tumor cells.   Nutrition Status Yes No Comments  Weight changes? [x]  []  Had significant weight loss prior to hospitalization, and continues to struggle to maintain  Swallowing concerns? [x]  []  Must take very small bites, and occasionally soft/pureed foods come back up through trach  PEG? [x]  []  Placed 04/24/2020   Referrals Yes No Comments  Social Work? [x]  []    Dentistry? [x]  []  05/13/2020 Saw Dr. Sandi Mariscal: The patient verbalized understanding of all options, and currently wishes to proceed with full mouth extractions with alveoloplasty as needed in the operating room under general anesthesia due to his high aspiration risk, extent of treatment  needed and dental phobia.  Swallowing therapy? [x]  []    Nutrition? [x]  []    Med/Onc? [x]  []  Dr. Arletha Pili Iruku   Safety Issues Yes No Comments  Prior radiation? []  [x]    Pacemaker/ICD? []  [x]    Possible current pregnancy? []  [x]  N/A  Is the patient on methotrexate? []  [x]     Tobacco/Marijuana/Snuff/ETOH use: Currently wearing nicotine patches to help stop smoking (previously smoked 1 pack/day). Previously drank alcohol daily, but has not had a drink ~6 months. Reports smoking marijuana occasionally   Past/Anticipated interventions by otolaryngology, if any:  04/22/2020 Dr. [name] --Tracheostomy --Awake, direct laryngoscopy with biopsy --Esophagoscopy with biopsy, under general anesthesia  Past/Anticipated interventions by medical oncology, if any:  Under care of Dr. Benay Pike 05/15/2020 --He was seen by surgery in the hospital and recommendation was to consider total laryngectomy vs CRT. --PET staging pending --Given his improving PS and possibility of organ preservation, I would lean towards CRT if no evidence of distant metastatic disease --We discussed about weekly cisplatin, discussed adverse effects including fatigue, nausea, vomiting, myelosuppression, nephrotoxicity, ototoxicity and neuropathy> He understands that some of the side effects could be permanent. --He is agreeable and will consider all recommendations. --He will meet Dr Isidore Moos to discuss recommendations as well. --RTC with me in 2 weeks (05/29/2020)  Current Complaints / other details:  Scheduled for PET scan on 05/22/2020 and modified barium swallow study on 05/24/2020

## 2020-05-21 ENCOUNTER — Other Ambulatory Visit: Payer: Self-pay

## 2020-05-21 ENCOUNTER — Ambulatory Visit
Admission: RE | Admit: 2020-05-21 | Discharge: 2020-05-21 | Disposition: A | Discharge status: Home / Self Care | Payer: Medicaid Other | Source: Ambulatory Visit | Attending: Radiation Oncology | Admitting: Radiation Oncology

## 2020-05-21 ENCOUNTER — Encounter: Payer: Self-pay | Admitting: General Practice

## 2020-05-21 VITALS — BP 117/67 | HR 90 | Temp 97.8°F | Resp 24 | Ht 67.0 in | Wt 115.2 lb

## 2020-05-21 DIAGNOSIS — C320 Malignant neoplasm of glottis: Secondary | ICD-10-CM | POA: Diagnosis present

## 2020-05-21 DIAGNOSIS — F17210 Nicotine dependence, cigarettes, uncomplicated: Secondary | ICD-10-CM | POA: Diagnosis not present

## 2020-05-21 DIAGNOSIS — J90 Pleural effusion, not elsewhere classified: Secondary | ICD-10-CM | POA: Insufficient documentation

## 2020-05-21 DIAGNOSIS — C329 Malignant neoplasm of larynx, unspecified: Secondary | ICD-10-CM

## 2020-05-21 DIAGNOSIS — I700 Atherosclerosis of aorta: Secondary | ICD-10-CM | POA: Diagnosis not present

## 2020-05-21 NOTE — Progress Notes (Signed)
Oncology Nurse Navigator Documentation  Met with patient during initial consult with Mr. Hitchens. He was accompanied by his sister, [name].  . Further introduced myself as his/their Navigator, explained my role as a member of the Care Team. . Provided New Patient Information packet: o Contact information for physician, this navigator, other members of the Care Team o Advance Directive information ([place] blue pamphlet with LCSW insert); provided [facility] AD booklet at his request,  o Fall Prevention Patient [place] sheet o Symptom Management Clinic information o [facility] campus map with highlight of [place] o SLP Information sheet . Provided and discussed educational handouts for PAC. Marland Kitchen Assisted with post-consult appt scheduling. . I assisted Mr. Hufstetler to clean his PEG tube and gave instructions to flush and change the dressing daily. I provided him with mesh briefs with the seam cut to help hold the PEG in place.  . They are aware that I will call the trach clinic to have an appointment set up and also Dr. Janeice Robinson office to discuss the option of surgery.  . They verbalized understanding of information provided. . I encouraged them to call with questions/concerns moving forward.  Harlow Asa, RN, BSN, OCN Head & Neck Oncology Nurse [place] [facility] [place] [phone]

## 2020-05-21 NOTE — Progress Notes (Addendum)
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 Laryngeal cancer ([place])  C32.9 Ambulatory referral to Social Work   Cancer Staging Glottis carcinoma [facility]) Staging form: Larynx - Glottis, AJCC 8th Edition - Clinical stage from 05/08/2020: cT3, cN1 - Unsigned Stage prefix: Initial diagnosis   CHIEF COMPLAINT: Here to discuss management of laryngeal cancer  HISTORY OF PRESENT ILLNESS::Mario Peters is a 55 y.o. male who presented to the ED on 04/07/2020 with four-month history of painful swallowing, fatigue, weight loss, and productive cough.  Subsequently, the patient saw Dr. Constance Holster, who performed a tracheostomy, direct laryngoscopy with biopsy, and esophagoscopy with biopsy.  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  Upper cervical esophagus was also biopsied but was negative for dysplasia or malignancy.  Pertinent imaging thus far includes soft tissue neck CT scan performed on 04/17/2020 revealing mildly hyperdense focus at the posterior aspect of the left vocal cord fold with associated soft tissue thickening.  There is also thickening in the soft tissues about the left neck.  This study was done without contrast.  PET scan is pending for tomorrow.   Nutrition Status Yes No Comments  Weight changes? [x]  []  Had significant weight loss prior to hospitalization, and continues to struggle to maintain  Swallowing concerns? [x]  []  Must take very small bites, and occasionally soft/pureed foods come back up through trach  PEG? [x]  []  Placed 04/24/2020   Referrals Yes No Comments  Social Work? [x]  []    Dentistry? [x]  []   05/13/2020 Saw Dr. Sandi Mariscal: The patient verbalized understanding of all options, and currently wishes to proceed with full mouth extractions with alveoloplasty as needed in the operating room under general anesthesia due to his high aspiration risk, extent of treatment needed and dental phobia.  Swallowing therapy? [x]  []    Nutrition? [x]  []    Med/Onc? [x]  []  Dr. Arletha Pili Iruku   Safety Issues Yes No Comments  Prior radiation? []  [x]    Pacemaker/ICD? []  [x]    Possible current pregnancy? []  [x]  N/A  Is the patient on methotrexate? []  [x]     Tobacco/Marijuana/Snuff/ETOH use: Currently wearing nicotine patches to help stop smoking (previously smoked 1 pack/day). Previously drank alcohol daily, but has not had a drink ~6 months. Reports smoking marijuana occasionally   Current Complaints / other details:  Scheduled for PET scan on 05/22/2020 and modified barium swallow study on 05/24/2020  He has met with medical oncology and is a candidate for weekly cisplatin concurrently if he pursues radiation therapy.  He is still undecided about whether he wants to pursue definitive surgery versus chemoradiation.  He is with a family member today who reports that he is a recovering addict of multiple drugs.   PREVIOUS RADIATION THERAPY: No  PAST MEDICAL HISTORY:  has a past medical history of Seizures ([place]).    PAST SURGICAL HISTORY: Past Surgical History:  Procedure Laterality Date  . DIRECT LARYNGOSCOPY N/A 04/22/2020   Procedure: DIRECT LARYNGOSCOPY WITH BIOPSY;  Surgeon: [name], MD;  Location: [facility];  Service: ENT;  Laterality: N/A;  . ESOPHAGOSCOPY  04/22/2020   Procedure: ESOPHAGOSCOPY;  Surgeon: [name], MD;  Location: [facility];  Service:  ENT;;  . IR GASTROSTOMY TUBE MOD SED  04/24/2020  . TRACHEOSTOMY TUBE PLACEMENT N/A 04/22/2020   Procedure: TRACHEOSTOMY;  Surgeon: [name], MD;  Location: [facility] OR;  Service: ENT;  Laterality: N/A;    FAMILY HISTORY: family history includes  Hypertension in an other family member.  SOCIAL HISTORY:  reports that he has been smoking. He has never used smokeless tobacco. He reports previous alcohol use. He reports that he does not use drugs.  ALLERGIES: Patient has no known allergies.  MEDICATIONS:  Current Outpatient Medications  Medication Sig Dispense Refill  . acetaminophen (TYLENOL) 325 MG tablet Take 2 tablets (650 mg total) by mouth every 6 (six) hours as needed for mild pain (or Fever >/= 101).    . chlorhexidine (PERIDEX) 0.12 % solution Rinse as directed with 73ms twice daily for 14 days. 473 mL 0  . nicotine (NICODERM CQ - DOSED IN MG/24 HOURS) 21 mg/24hr patch Place 1 patch (21 mg total) onto the skin daily. 28 patch 0  . Nutritional Supplements (FEEDING SUPPLEMENT, OSMOLITE 1.5 CAL,) LIQD Place 474 mLs into feeding tube 4 (four) times daily.  0  . senna-docusate (SENOKOT-S) 8.6-50 MG tablet Place 1 tablet into feeding tube 2 (two) times daily. 60 tablet 0   No current facility-administered medications for this encounter.   Facility-Administered Medications Ordered in Other Encounters  Medication Dose Route Frequency Provider Last Rate Last Admin  . fludeoxyglucose F - 18 (FDG) injection 58.61millicurie  56.83millicurie Intravenous Once SKerby Moors MD        REVIEW OF SYSTEMS:  Notable for that above.   PHYSICAL EXAM:  height is 5' 7"  (1.702 m) and weight is 115 lb 3.2 oz (52.3 kg). His temperature is 97.8 F (36.6 C). His blood pressure is 117/67 and his pulse is 90. His respiration is 24 (abnormal) and oxygen saturation is 98%.   General: Alert and oriented, in no acute distress HEENT: Head is normocephalic. Extraocular movements are intact. Oropharynx is notable for no lesions in the upper throat. Neck: Neck is notable for symmetric bilateral thickening of the neck tissues at level 2/ 3 - these tissues are tender to palpation Stoma/trach intact Heart: Regular in rate and rhythm with no murmurs, rubs, or  gallops. Chest: Bilateral rhonchi  Ext: No edema in his lower extremities Lymphatics: see Neck Exam   ECOG = 2  0 - Asymptomatic (Fully active, able to carry on all predisease activities without restriction)  1 - Symptomatic but completely ambulatory (Restricted in physically strenuous activity but ambulatory and able to carry out work of a light or sedentary nature. For example, light housework, office work)  2 - Symptomatic, <50% in bed during the day (Ambulatory and capable of all self care but unable to carry out any work activities. Up and about more than 50% of waking hours)  3 - Symptomatic, >50% in bed, but not bedbound (Capable of only limited self-care, confined to bed or chair 50% or more of waking hours)  4 - Bedbound (Completely disabled. Cannot carry on any self-care. Totally confined to bed or chair)  5 - Death   OEustace PenMM, Creech RH, Tormey DC, et al. [phone]. "Toxicity and response criteria of the [facility]". [place]col. 5 (6): 649-55   LABORATORY DATA:  Lab Results  Component Value Date   WBC 7.6 05/01/2020   HGB 14.4 05/01/2020   HCT 44.3 05/01/2020   MCV 91.5 05/01/2020   PLT 391  05/01/2020   CMP     Component Value Date/Time   NA 130 (L) 05/01/2020 0524   K 3.7 05/01/2020 0524   CL 93 (L) 05/01/2020 0524   CO2 31 05/01/2020 0524   GLUCOSE 91 05/01/2020 0524   BUN 21 (H) 05/01/2020 0524   CREATININE 0.52 (L) 05/01/2020 0524   CALCIUM 9.0 05/01/2020 0524   PROT 6.5 04/29/2020 0116   ALBUMIN 2.1 (L) 04/29/2020 0116   AST 29 04/29/2020 0116   ALT 19 04/29/2020 0116   ALKPHOS 58 04/29/2020 0116   BILITOT 0.5 04/29/2020 0116   GFRNONAA >60 05/01/2020 0524      Lab Results  Component Value Date   TSH 0.602 04/22/2020     RADIOGRAPHY: CT ABDOMEN WO CONTRAST  Result Date: 04/23/2020 CLINICAL DATA:  Assess anatomy for gastrostomy tube placement EXAM: CT ABDOMEN WITHOUT CONTRAST TECHNIQUE: Multidetector CT imaging of the  abdomen was performed following the standard protocol without IV contrast. COMPARISON:  CT chest 04/18/2020 FINDINGS: Lower chest: Trace bilateral pleural effusions. Bilateral lower lobe atelectasis with superimposed peripheral consolidation in a peribronchovascular distribution. In the setting of diffuse lower lobe bronchial wall thickening, findings are concerning for aspiration. More mild peribronchovascular ground-glass attenuation opacities in the inferior aspect of the middle lobe and lingula. The visualized cardiac structures are normal in size. No pericardial effusion. Hepatobiliary: Normal hepatic contour and morphology. No discrete hepatic lesion. High density material present within the gallbladder lumen likely representing sludge and/or small stones. No biliary ductal dilatation. Pancreas: Unremarkable. No pancreatic ductal dilatation or surrounding inflammatory changes. Spleen: Normal in size without focal abnormality. Peripheral linear calcification along the splenic margin. Adrenals/Urinary Tract: Unremarkable adrenal glands and kidneys. No hydronephrosis or nephrolithiasis. Stomach/Bowel: Gastric tube is present. The tip of the gastric tube is in the proximal duodenum. Normal gastric anatomy. There is mild colonic interposition at the splenic flexure. Excellent visualization of the colon on the scout radiograph. Vascular/Lymphatic: Limited evaluation in the absence of intravenous contrast. Aortic atherosclerosis is present. No aneurysm. No suspicious lymphadenopathy. Other: No abdominal wall hernia or abnormality. Musculoskeletal: No acute fracture or aggressive appearing lytic or blastic osseous lesion. IMPRESSION: 1. Anatomy is suitable for percutaneous gastrostomy tube placement. 2. Probable aspiration changes in the bilateral lower lobes, and to a lesser extent in the inferior middle lobe and lingula. 3. Trace bilateral pleural effusions. 4. The tip of the gastric tube is in the proximal  duodenum. 5. Sludge and/or small stones in the gallbladder. 6.  Aortic Atherosclerosis (ICD10-170.0). Electronically Signed   By: Jacqulynn Cadet M.D.   On: 04/23/2020 07:05   IR GASTROSTOMY TUBE MOD SED  Result Date: 04/24/2020 INDICATION: History of head neck cancer. Please perform percutaneous gastrostomy tube placement for enteric nutrition supplementation purposes. EXAM: PULL TROUGH GASTROSTOMY TUBE PLACEMENT COMPARISON:  CT abdomen and pelvis-04/22/2020 MEDICATIONS: Ancef 2 gm IV; Antibiotics were administered within 1 hour of the procedure. Glucagon 1 mg IV CONTRAST:  20 mL of Omnipaque 300 administered into the gastric lumen. ANESTHESIA/SEDATION: Moderate (conscious) sedation was employed during this procedure. A total of Versed 1 mg and Fentanyl 50 mcg was administered intravenously. Moderate Sedation Time: 35 minutes. The patient's level of consciousness and vital signs were monitored continuously by radiology nursing throughout the procedure under my direct supervision. FLUOROSCOPY TIME:  6 minutes (21 mGy) COMPLICATIONS: None immediate. PROCEDURE: Informed written consent was obtained from the patient following explanation of the procedure, risks, benefits and alternatives. A time out was performed prior to the initiation  of the procedure. Ultrasound scanning was performed to demarcate the edge of the left lobe of the liver. Maximal barrier sterile technique utilized including caps, mask, sterile gowns, sterile gloves, large sterile drape, hand hygiene and Betadine prep. The left upper quadrant was sterilely prepped and draped. An oral gastric catheter was inserted into the stomach under fluoroscopy. The existing nasogastric feeding tube was removed. The left costal margin and air opacified transverse colon were identified and avoided. Air was injected into the stomach for insufflation and visualization under fluoroscopy. Under sterile conditions a 17 gauge trocar needle was utilized to access the  stomach percutaneously beneath the left subcostal margin after the overlying soft tissues were anesthetized with 1% Lidocaine with epinephrine. Needle position was confirmed within the stomach with aspiration of air and injection of small amount of contrast. A single T tack was deployed for gastropexy. Over an Amplatz guide wire, a 9-French sheath was inserted into the stomach. A snare device was utilized to capture the oral gastric catheter. The snare device was pulled retrograde from the stomach up the esophagus and out the oropharynx. The 20-French pull-through gastrostomy was connected to the snare device and pulled antegrade through the oropharynx down the esophagus into the stomach and then through the percutaneous tract external to the patient. The gastrostomy was assembled externally. Contrast injection confirms position in the stomach. Several spot radiographic images were obtained in various obliquities for documentation. The patient tolerated procedure well without immediate post procedural complication. FINDINGS: After successful fluoroscopic guided placement, the gastrostomy tube is appropriately positioned with internal disc against the ventral aspect of the gastric lumen. IMPRESSION: Successful fluoroscopic insertion of a 20-French pull-through gastrostomy tube. The gastrostomy may be used immediately for medication administration and in 24 hrs for the initiation of feeds. Electronically Signed   By: Sandi Mariscal M.D.   On: 04/24/2020 12:17   DG Chest Port 1 View  Result Date: 04/22/2020 CLINICAL DATA:  55 year old male status post tracheostomy placement. EXAM: PORTABLE CHEST 1 VIEW COMPARISON:  Chest radiograph dated 04/17/2020 and CT dated 09/17/2020 FINDINGS: Tracheostomy with tip approximately 4.5 cm above the carina. Enteric tube extends below the diaphragm with tip beyond the margin of the image. Bilateral pulmonary nodularity primarily involving the lower lung fields in keeping with known  pneumonia. No significant pleural effusion. No pneumothorax. No acute osseous pathology. Old healed fracture deformity of the left humeral neck. IMPRESSION: Tracheostomy above the carina. Electronically Signed   By: Anner Crete M.D.   On: 04/22/2020 17:47   DG ABD ACUTE 2+V W 1V CHEST  Result Date: 04/26/2020 CLINICAL DATA:  Abdominal pain EXAM: DG ABDOMEN ACUTE WITH 1 VIEW CHEST COMPARISON:  Chest radiograph April 22, 2020; CT abdomen April 22, 2020. FINDINGS: AP chest: Tracheostomy catheter tip is 5.3 cm above the carina. No pneumothorax. There are layering pleural effusions on each side with bibasilar atelectatic change. Apparent postoperative changes noted in the right base region. Heart is upper normal in size with pulmonary vascularity normal. No adenopathy. There is a healed fracture of the proximal left humerus with remodeling. Supine and upright abdomen: Gastrostomy catheter in region of stomach. There is no bowel dilatation or air-fluid level to suggest bowel obstruction. No free air. Vascular calcifications are noted in the pelvis. IMPRESSION: Gastrostomy catheter in region of stomach. No evident bowel obstruction or free air. Ill-defined opacity consistent with effusions and atelectasis in the lung bases. No frank consolidation. Stable cardiac silhouette. Electronically Signed   By: Lowella Grip III  M.D.   On: 04/26/2020 10:08   DG Swallowing Func-Speech Pathology  Result Date: 04/25/2020 Objective Swallowing Evaluation: Type of Study: MBS-Modified Barium Swallow Study  Patient Details Name: Daemyn Gariepy MRN: [id number] Date of Birth: [birth year] Today's Date: 04/25/2020 Time: SLP Start Time (ACUTE ONLY): 1100 -SLP Stop Time (ACUTE ONLY): 1130 SLP Time Calculation (min) (ACUTE ONLY): 30 min Past Medical History: Past Medical History: Diagnosis Date . Seizures ([place])  Past Surgical History: Past Surgical History: Procedure Laterality Date . DIRECT LARYNGOSCOPY N/A 04/22/2020  Procedure: DIRECT  LARYNGOSCOPY WITH BIOPSY;  Surgeon: [name], MD;  Location: [facility];  Service: ENT;  Laterality: N/A; . ESOPHAGOSCOPY  04/22/2020  Procedure: ESOPHAGOSCOPY;  Surgeon: [name], MD;  Location: [facility];  Service: ENT;; . IR GASTROSTOMY TUBE MOD SED  04/24/2020 . TRACHEOSTOMY TUBE PLACEMENT N/A 04/22/2020  Procedure: TRACHEOSTOMY;  Surgeon: [name], MD;  Location: [facility] OR;  Service: ENT;  Laterality: N/A; HPI: Pt is a 55 y.o. male with medical history significant of EtOH abuse,  tobacco abuse, remote hx of seizures. He presented via EMS due to inability to get out of bed, weight loss, and productive cough x4 months and increased difficulty swallowing. CXR 3/30: Ill-defined left greater than right reticulonodular and ground-glass opacity, suspicious for bilateral pneumonia, potentially atypical or viral pneumonia. CT soft tissue: Mildly hyperdense focus at the posterior aspect of the left vocal fold with associated soft tissue thickening. 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 airway is partially compromised.Trach recommended.  Pt was to have trach placed on Friday 4/1 but it was cancelled, is now tentatively scheduled for [place] 4/4.  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  There were no recognizable structures.  It spilled over along the lateral pharyngeal wall on the right side.  The left piriform sinus appeared to be clear."  Subjective: -- (alert, talking with PMV placed intermittently) Assessment / Plan / Recommendation CHL IP CLINICAL IMPRESSIONS 04/25/2020 Clinical Impression Pt participated in brief MBS - PMV placed for one of  only two boluses given due to the level of impairment and severe aspiration. He demonstrated a profound dysphagia with no ability to close the laryngeal vestibule.  There was marginal movement of the hyoid, no epiglottic inversion, no approximation of the arytenoids to epiglottic base, and no identifiable onset of the pharyngeal swallow.  One bolus each of nectar and thin liquid was observed to spill immediately into the larynx, eliciting a cough, and pt was able to move some of the bolus back into the oral cavity for suctioning. What remained of the bolus was immediately aspirated, a portion of which was ejected from the trach tube.  Mr. Dax watched the video in real time - we discussed his swallowing deficits and his inability to eat safely or comfortably at this time.  SLP will follow for further education.  Continue NPO with PEG for nutrition. SLP Visit Diagnosis Dysphagia, pharyngeal phase (R13.13) Attention and concentration deficit following -- Frontal lobe and executive function deficit following -- Impact on safety and function Severe aspiration risk   CHL IP TREATMENT RECOMMENDATION 04/25/2020 Treatment Recommendations Therapy as outlined in treatment plan below   No flowsheet data found. CHL IP DIET  RECOMMENDATION 04/25/2020 SLP Diet Recommendations NPO Liquid Administration via -- Medication Administration -- Compensations -- Postural Changes --   CHL IP OTHER RECOMMENDATIONS 04/25/2020 Recommended Consults -- Oral Care Recommendations Oral care QID Other Recommendations Have oral suction available   CHL IP FOLLOW UP RECOMMENDATIONS 04/25/2020 Follow up Recommendations Other (comment)   CHL IP FREQUENCY AND DURATION 04/25/2020 Speech Therapy Frequency (ACUTE ONLY) min 2x/week Treatment Duration 2 weeks      CHL IP ORAL PHASE 04/25/2020 Oral Phase WFL Oral - Pudding Teaspoon -- Oral - Pudding Cup -- Oral - Honey Teaspoon -- Oral - Honey Cup -- Oral - Nectar Teaspoon -- Oral - Nectar Cup -- Oral - Nectar Straw --  Oral - Thin Teaspoon -- Oral - Thin Cup -- Oral - Thin Straw -- Oral - Puree -- Oral - Mech Soft -- Oral - Regular -- Oral - Multi-Consistency -- Oral - Pill -- Oral Phase - Comment --  CHL IP PHARYNGEAL PHASE 04/25/2020 Pharyngeal Phase Impaired Pharyngeal- Pudding Teaspoon -- Pharyngeal -- Pharyngeal- Pudding Cup -- Pharyngeal -- Pharyngeal- Honey Teaspoon -- Pharyngeal -- Pharyngeal- Honey Cup -- Pharyngeal -- Pharyngeal- Nectar Teaspoon Reduced epiglottic inversion;Reduced anterior laryngeal mobility;Reduced laryngeal elevation;Reduced airway/laryngeal closure;Penetration/Aspiration before swallow;Significant aspiration (Amount);Pharyngeal residue - pyriform;Pharyngeal residue - valleculae Pharyngeal Material enters airway, passes BELOW cords and not ejected out despite cough attempt by patient Pharyngeal- Nectar Cup -- Pharyngeal -- Pharyngeal- Nectar Straw -- Pharyngeal -- Pharyngeal- Thin Teaspoon -- Pharyngeal -- Pharyngeal- Thin Cup -- Pharyngeal -- Pharyngeal- Thin Straw Reduced epiglottic inversion;Reduced anterior laryngeal mobility;Reduced laryngeal elevation;Reduced airway/laryngeal closure;Penetration/Aspiration before swallow;Significant aspiration (Amount);Pharyngeal residue - pyriform;Pharyngeal residue - valleculae Pharyngeal Material enters airway, passes BELOW cords and not ejected out despite cough attempt by patient Pharyngeal- Puree -- Pharyngeal -- Pharyngeal- Mechanical Soft -- Pharyngeal -- Pharyngeal- Regular -- Pharyngeal -- Pharyngeal- Multi-consistency -- Pharyngeal -- Pharyngeal- Pill -- Pharyngeal -- Pharyngeal Comment --  No flowsheet data found. Juan Quam Laurice 04/25/2020, 11:50 AM                 IMPRESSION/PLAN: Laryngeal cancer  This is a delightful patient with head and neck cancer. I recommend definitive radiotherapy for this patient.  Anticipate concurrent chemotherapy.  His alternative would be surgery and I anticipate that this will be a laryngectomy.  We will  refer him back to otolaryngology so that he can have a conversation about the details risks benefits and side effects of surgery so he can make an informed decision.  I explained the patient and his family that this would most likely need to be followed by adjuvant radiation at least.  Of note PET scan is pending for full staging so recommendations were preliminary today..  We discussed the potential risks, benefits, and side effects of radiotherapy. We talked in detail about acute and late effects. We discussed that some of the most bothersome acute effects may be mucositis, dysgeusia, salivary changes, skin irritation, hair loss, dehydration, weight loss and fatigue. We talked about late effects which include but are not necessarily limited to dysphagia, hypothyroidism, nerve injury, vascular injury, spinal cord injury, xerostomia, trismus, neck edema, and potential injury to any of the tissues in the head and neck region. No guarantees of treatment were given. A consent form was signed and placed in the patient's medical record. The patient is enthusiastic about proceeding with treatment. I look forward to participating in the patient's care.    Simulation (treatment planning) will take place after the patient makes his decision regarding  modality of care.  He also anticipates full extractions in the near future by dentistry  We also discussed that the treatment of head and neck cancer is a multidisciplinary process to maximize treatment outcomes and quality of life. For this reason the following referrals have been or will be made:   Medical oncology to discuss chemotherapy    Dentistry for dental evaluation, possible extractions in the radiation fields, and /or advice on reducing risk of cavities, osteoradionecrosis, or other oral issues.  I do not anticipate [address] line to get close to his tooth roots but he has poor dentition and anticipates extractions in the near future, full extractions.    Nutritionist for nutrition support during and after treatment.   Speech language pathology for swallowing and/or speech therapy.   Social work for social support.    Physical therapy due to risk of lymphedema in neck and deconditioning.  Patient and family were educated that his cancer is likely related to smoking and alcohol use.  Urged to stop smoking marijuana which is the drug that he is currently using.   On date of service, in total, I spent 60 minutes on this encounter. Patient was seen in person.  __________________________________________   Eppie Gibson, MD  This document serves as a record of services personally performed by Eppie Gibson, MD. It was created on his behalf by Clerance Lav, a trained medical scribe. The creation of this record is based on the scribe's personal observations and the provider's statements to them. This document has been checked and approved by the attending [email or web address]

## 2020-05-21 NOTE — H&P (View-Only) (Signed)
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 Laryngeal cancer ([place])  C32.9 Ambulatory referral to Social Work   Cancer Staging Glottis carcinoma [facility]) Staging form: Larynx - Glottis, AJCC 8th Edition - Clinical stage from 05/08/2020: cT3, cN1 - Unsigned Stage prefix: Initial diagnosis   CHIEF COMPLAINT: Here to discuss management of laryngeal cancer  HISTORY OF PRESENT ILLNESS::Mario Peters is a 55 y.o. male who presented to the ED on 04/07/2020 with four-month history of painful swallowing, fatigue, weight loss, and productive cough.  Subsequently, the patient saw Dr. Constance Holster, who performed a tracheostomy, direct laryngoscopy with biopsy, and esophagoscopy with biopsy.  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  Upper cervical esophagus was also biopsied but was negative for dysplasia or malignancy.  Pertinent imaging thus far includes soft tissue neck CT scan performed on 04/17/2020 revealing mildly hyperdense focus at the posterior aspect of the left vocal cord fold with associated soft tissue thickening.  There is also thickening in the soft tissues about the left neck.  This study was done without contrast.  PET scan is pending for tomorrow.   Nutrition Status Yes No Comments  Weight changes? [x]  []  Had significant weight loss prior to hospitalization, and continues to struggle to maintain  Swallowing concerns? [x]  []  Must take very small bites, and occasionally soft/pureed foods come back up through trach  PEG? [x]  []  Placed 04/24/2020   Referrals Yes No Comments  Social Work? [x]  []    Dentistry? [x]  []   05/13/2020 Saw Dr. Sandi Mariscal: The patient verbalized understanding of all options, and currently wishes to proceed with full mouth extractions with alveoloplasty as needed in the operating room under general anesthesia due to his high aspiration risk, extent of treatment needed and dental phobia.  Swallowing therapy? [x]  []    Nutrition? [x]  []    Med/Onc? [x]  []  Dr. Arletha Pili Iruku   Safety Issues Yes No Comments  Prior radiation? []  [x]    Pacemaker/ICD? []  [x]    Possible current pregnancy? []  [x]  N/A  Is the patient on methotrexate? []  [x]     Tobacco/Marijuana/Snuff/ETOH use: Currently wearing nicotine patches to help stop smoking (previously smoked 1 pack/day). Previously drank alcohol daily, but has not had a drink ~6 months. Reports smoking marijuana occasionally   Current Complaints / other details:  Scheduled for PET scan on 05/22/2020 and modified barium swallow study on 05/24/2020  He has met with medical oncology and is a candidate for weekly cisplatin concurrently if he pursues radiation therapy.  He is still undecided about whether he wants to pursue definitive surgery versus chemoradiation.  He is with a family member today who reports that he is a recovering addict of multiple drugs.   PREVIOUS RADIATION THERAPY: No  PAST MEDICAL HISTORY:  has a past medical history of Seizures ([place]).    PAST SURGICAL HISTORY: Past Surgical History:  Procedure Laterality Date  . DIRECT LARYNGOSCOPY N/A 04/22/2020   Procedure: DIRECT LARYNGOSCOPY WITH BIOPSY;  Surgeon: [name], MD;  Location: [place];  Service: ENT;  Laterality: N/A;  . ESOPHAGOSCOPY  04/22/2020   Procedure: ESOPHAGOSCOPY;  Surgeon: [name], MD;  Location: [place];  Service:  ENT;;  . IR GASTROSTOMY TUBE MOD SED  04/24/2020  . TRACHEOSTOMY TUBE PLACEMENT N/A 04/22/2020   Procedure: TRACHEOSTOMY;  Surgeon: [name], MD;  Location: [facility] OR;  Service: ENT;  Laterality: N/A;    FAMILY HISTORY: family history includes  Hypertension in an other family member.  SOCIAL HISTORY:  reports that he has been smoking. He has never used smokeless tobacco. He reports previous alcohol use. He reports that he does not use drugs.  ALLERGIES: Patient has no known allergies.  MEDICATIONS:  Current Outpatient Medications  Medication Sig Dispense Refill  . acetaminophen (TYLENOL) 325 MG tablet Take 2 tablets (650 mg total) by mouth every 6 (six) hours as needed for mild pain (or Fever >/= 101).    . chlorhexidine (PERIDEX) 0.12 % solution Rinse as directed with 53ms twice daily for 14 days. 473 mL 0  . nicotine (NICODERM CQ - DOSED IN MG/24 HOURS) 21 mg/24hr patch Place 1 patch (21 mg total) onto the skin daily. 28 patch 0  . Nutritional Supplements (FEEDING SUPPLEMENT, OSMOLITE 1.5 CAL,) LIQD Place 474 mLs into feeding tube 4 (four) times daily.  0  . senna-docusate (SENOKOT-S) 8.6-50 MG tablet Place 1 tablet into feeding tube 2 (two) times daily. 60 tablet 0   No current facility-administered medications for this encounter.   Facility-Administered Medications Ordered in Other Encounters  Medication Dose Route Frequency Provider Last Rate Last Admin  . fludeoxyglucose F - 18 (FDG) injection 52.67millicurie  51.24millicurie Intravenous Once SKerby Moors MD        REVIEW OF SYSTEMS:  Notable for that above.   PHYSICAL EXAM:  height is 5' 7"  (1.702 m) and weight is 115 lb 3.2 oz (52.3 kg). His temperature is 97.8 F (36.6 C). His blood pressure is 117/67 and his pulse is 90. His respiration is 24 (abnormal) and oxygen saturation is 98%.   General: Alert and oriented, in no acute distress HEENT: Head is normocephalic. Extraocular movements are intact. Oropharynx is notable for no lesions in the upper throat. Neck: Neck is notable for symmetric bilateral thickening of the neck tissues at level 2/ 3 - these tissues are tender to palpation Stoma/trach intact Heart: Regular in rate and rhythm with no murmurs, rubs, or  gallops. Chest: Bilateral rhonchi  Ext: No edema in his lower extremities Lymphatics: see Neck Exam   ECOG = 2  0 - Asymptomatic (Fully active, able to carry on all predisease activities without restriction)  1 - Symptomatic but completely ambulatory (Restricted in physically strenuous activity but ambulatory and able to carry out work of a light or sedentary nature. For example, light housework, office work)  2 - Symptomatic, <50% in bed during the day (Ambulatory and capable of all self care but unable to carry out any work activities. Up and about more than 50% of waking hours)  3 - Symptomatic, >50% in bed, but not bedbound (Capable of only limited self-care, confined to bed or chair 50% or more of waking hours)  4 - Bedbound (Completely disabled. Cannot carry on any self-care. Totally confined to bed or chair)  5 - Death   OEustace PenMM, Creech RH, Tormey DC, et al. ([id number]. "Toxicity and response criteria of the [facility]". ABramwellOncol. 5 (6): 649-55   LABORATORY DATA:  Lab Results  Component Value Date   WBC 7.6 05/01/2020   HGB 14.4 05/01/2020   HCT 44.3 05/01/2020   MCV 91.5 05/01/2020   PLT 391  05/01/2020   CMP     Component Value Date/Time   NA 130 (L) 05/01/2020 0524   K 3.7 05/01/2020 0524   CL 93 (L) 05/01/2020 0524   CO2 31 05/01/2020 0524   GLUCOSE 91 05/01/2020 0524   BUN 21 (H) 05/01/2020 0524   CREATININE 0.52 (L) 05/01/2020 0524   CALCIUM 9.0 05/01/2020 0524   PROT 6.5 04/29/2020 0116   ALBUMIN 2.1 (L) 04/29/2020 0116   AST 29 04/29/2020 0116   ALT 19 04/29/2020 0116   ALKPHOS 58 04/29/2020 0116   BILITOT 0.5 04/29/2020 0116   GFRNONAA >60 05/01/2020 0524      Lab Results  Component Value Date   TSH 0.602 04/22/2020     RADIOGRAPHY: CT ABDOMEN WO CONTRAST  Result Date: 04/23/2020 CLINICAL DATA:  Assess anatomy for gastrostomy tube placement EXAM: CT ABDOMEN WITHOUT CONTRAST TECHNIQUE: Multidetector CT imaging of the  abdomen was performed following the standard protocol without IV contrast. COMPARISON:  CT chest 04/18/2020 FINDINGS: Lower chest: Trace bilateral pleural effusions. Bilateral lower lobe atelectasis with superimposed peripheral consolidation in a peribronchovascular distribution. In the setting of diffuse lower lobe bronchial wall thickening, findings are concerning for aspiration. More mild peribronchovascular ground-glass attenuation opacities in the inferior aspect of the middle lobe and lingula. The visualized cardiac structures are normal in size. No pericardial effusion. Hepatobiliary: Normal hepatic contour and morphology. No discrete hepatic lesion. High density material present within the gallbladder lumen likely representing sludge and/or small stones. No biliary ductal dilatation. Pancreas: Unremarkable. No pancreatic ductal dilatation or surrounding inflammatory changes. Spleen: Normal in size without focal abnormality. Peripheral linear calcification along the splenic margin. Adrenals/Urinary Tract: Unremarkable adrenal glands and kidneys. No hydronephrosis or nephrolithiasis. Stomach/Bowel: Gastric tube is present. The tip of the gastric tube is in the proximal duodenum. Normal gastric anatomy. There is mild colonic interposition at the splenic flexure. Excellent visualization of the colon on the scout radiograph. Vascular/Lymphatic: Limited evaluation in the absence of intravenous contrast. Aortic atherosclerosis is present. No aneurysm. No suspicious lymphadenopathy. Other: No abdominal wall hernia or abnormality. Musculoskeletal: No acute fracture or aggressive appearing lytic or blastic osseous lesion. IMPRESSION: 1. Anatomy is suitable for percutaneous gastrostomy tube placement. 2. Probable aspiration changes in the bilateral lower lobes, and to a lesser extent in the inferior middle lobe and lingula. 3. Trace bilateral pleural effusions. 4. The tip of the gastric tube is in the proximal  duodenum. 5. Sludge and/or small stones in the gallbladder. 6.  Aortic Atherosclerosis (ICD10-170.0). Electronically Signed   By: Jacqulynn Cadet M.D.   On: 04/23/2020 07:05   IR GASTROSTOMY TUBE MOD SED  Result Date: 04/24/2020 INDICATION: History of head neck cancer. Please perform percutaneous gastrostomy tube placement for enteric nutrition supplementation purposes. EXAM: PULL TROUGH GASTROSTOMY TUBE PLACEMENT COMPARISON:  CT abdomen and pelvis-04/22/2020 MEDICATIONS: Ancef 2 gm IV; Antibiotics were administered within 1 hour of the procedure. Glucagon 1 mg IV CONTRAST:  20 mL of Omnipaque 300 administered into the gastric lumen. ANESTHESIA/SEDATION: Moderate (conscious) sedation was employed during this procedure. A total of Versed 1 mg and Fentanyl 50 mcg was administered intravenously. Moderate Sedation Time: 35 minutes. The patient's level of consciousness and vital signs were monitored continuously by radiology nursing throughout the procedure under my direct supervision. FLUOROSCOPY TIME:  6 minutes (21 mGy) COMPLICATIONS: None immediate. PROCEDURE: Informed written consent was obtained from the patient following explanation of the procedure, risks, benefits and alternatives. A time out was performed prior to the initiation  of the procedure. Ultrasound scanning was performed to demarcate the edge of the left lobe of the liver. Maximal barrier sterile technique utilized including caps, mask, sterile gowns, sterile gloves, large sterile drape, hand hygiene and Betadine prep. The left upper quadrant was sterilely prepped and draped. An oral gastric catheter was inserted into the stomach under fluoroscopy. The existing nasogastric feeding tube was removed. The left costal margin and air opacified transverse colon were identified and avoided. Air was injected into the stomach for insufflation and visualization under fluoroscopy. Under sterile conditions a 17 gauge trocar needle was utilized to access the  stomach percutaneously beneath the left subcostal margin after the overlying soft tissues were anesthetized with 1% Lidocaine with epinephrine. Needle position was confirmed within the stomach with aspiration of air and injection of small amount of contrast. A single T tack was deployed for gastropexy. Over an Amplatz guide wire, a 9-French sheath was inserted into the stomach. A snare device was utilized to capture the oral gastric catheter. The snare device was pulled retrograde from the stomach up the esophagus and out the oropharynx. The 20-French pull-through gastrostomy was connected to the snare device and pulled antegrade through the oropharynx down the esophagus into the stomach and then through the percutaneous tract external to the patient. The gastrostomy was assembled externally. Contrast injection confirms position in the stomach. Several spot radiographic images were obtained in various obliquities for documentation. The patient tolerated procedure well without immediate post procedural complication. FINDINGS: After successful fluoroscopic guided placement, the gastrostomy tube is appropriately positioned with internal disc against the ventral aspect of the gastric lumen. IMPRESSION: Successful fluoroscopic insertion of a 20-French pull-through gastrostomy tube. The gastrostomy may be used immediately for medication administration and in 24 hrs for the initiation of feeds. Electronically Signed   By: Sandi Mariscal M.D.   On: 04/24/2020 12:17   DG Chest Port 1 View  Result Date: 04/22/2020 CLINICAL DATA:  55 year old male status post tracheostomy placement. EXAM: PORTABLE CHEST 1 VIEW COMPARISON:  Chest radiograph dated 04/17/2020 and CT dated 09/17/2020 FINDINGS: Tracheostomy with tip approximately 4.5 cm above the carina. Enteric tube extends below the diaphragm with tip beyond the margin of the image. Bilateral pulmonary nodularity primarily involving the lower lung fields in keeping with known  pneumonia. No significant pleural effusion. No pneumothorax. No acute osseous pathology. Old healed fracture deformity of the left humeral neck. IMPRESSION: Tracheostomy above the carina. Electronically Signed   By: Anner Crete M.D.   On: 04/22/2020 17:47   DG ABD ACUTE 2+V W 1V CHEST  Result Date: 04/26/2020 CLINICAL DATA:  Abdominal pain EXAM: DG ABDOMEN ACUTE WITH 1 VIEW CHEST COMPARISON:  Chest radiograph April 22, 2020; CT abdomen April 22, 2020. FINDINGS: AP chest: Tracheostomy catheter tip is 5.3 cm above the carina. No pneumothorax. There are layering pleural effusions on each side with bibasilar atelectatic change. Apparent postoperative changes noted in the right base region. Heart is upper normal in size with pulmonary vascularity normal. No adenopathy. There is a healed fracture of the proximal left humerus with remodeling. Supine and upright abdomen: Gastrostomy catheter in region of stomach. There is no bowel dilatation or air-fluid level to suggest bowel obstruction. No free air. Vascular calcifications are noted in the pelvis. IMPRESSION: Gastrostomy catheter in region of stomach. No evident bowel obstruction or free air. Ill-defined opacity consistent with effusions and atelectasis in the lung bases. No frank consolidation. Stable cardiac silhouette. Electronically Signed   By: Lowella Grip III  M.D.   On: 04/26/2020 10:08   DG Swallowing Func-Speech Pathology  Result Date: 04/25/2020 Objective Swallowing Evaluation: Type of Study: MBS-Modified Barium Swallow Study  Patient Details Name: Mario Peters MRN: [id number] Date of Birth: 12-31-1965 Today's Date: 04/25/2020 Time: SLP Start Time (ACUTE ONLY): 1100 -SLP Stop Time (ACUTE ONLY): 1130 SLP Time Calculation (min) (ACUTE ONLY): 30 min Past Medical History: Past Medical History: Diagnosis Date . Seizures ([place])  Past Surgical History: Past Surgical History: Procedure Laterality Date . DIRECT LARYNGOSCOPY N/A 04/22/2020  Procedure: DIRECT  LARYNGOSCOPY WITH BIOPSY;  Surgeon: [name], MD;  Location: [place];  Service: ENT;  Laterality: N/A; . ESOPHAGOSCOPY  04/22/2020  Procedure: ESOPHAGOSCOPY;  Surgeon: [name], MD;  Location: [place];  Service: ENT;; . IR GASTROSTOMY TUBE MOD SED  04/24/2020 . TRACHEOSTOMY TUBE PLACEMENT N/A 04/22/2020  Procedure: TRACHEOSTOMY;  Surgeon: [name], MD;  Location: [facility] OR;  Service: ENT;  Laterality: N/A; HPI: Pt is a 55 y.o. male with medical history significant of EtOH abuse,  tobacco abuse, remote hx of seizures. He presented via EMS due to inability to get out of bed, weight loss, and productive cough x4 months and increased difficulty swallowing. CXR 3/30: Ill-defined left greater than right reticulonodular and ground-glass opacity, suspicious for bilateral pneumonia, potentially atypical or viral pneumonia. CT soft tissue: Mildly hyperdense focus at the posterior aspect of the left vocal fold with associated soft tissue thickening. 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 airway is partially compromised.Trach recommended.  Pt was to have trach placed on Friday 4/1 but it was cancelled, is now tentatively scheduled for [place] 4/4.  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  There were no recognizable structures.  It spilled over along the lateral pharyngeal wall on the right side.  The left piriform sinus appeared to be clear."  Subjective: -- (alert, talking with PMV placed intermittently) Assessment / Plan / Recommendation CHL IP CLINICAL IMPRESSIONS 04/25/2020 Clinical Impression Pt participated in brief MBS - PMV placed for one of  only two boluses given due to the level of impairment and severe aspiration. He demonstrated a profound dysphagia with no ability to close the laryngeal vestibule.  There was marginal movement of the hyoid, no epiglottic inversion, no approximation of the arytenoids to epiglottic base, and no identifiable onset of the pharyngeal swallow.  One bolus each of nectar and thin liquid was observed to spill immediately into the larynx, eliciting a cough, and pt was able to move some of the bolus back into the oral cavity for suctioning. What remained of the bolus was immediately aspirated, a portion of which was ejected from the trach tube.  Mr. Kisner watched the video in real time - we discussed his swallowing deficits and his inability to eat safely or comfortably at this time.  SLP will follow for further education.  Continue NPO with PEG for nutrition. SLP Visit Diagnosis Dysphagia, pharyngeal phase (R13.13) Attention and concentration deficit following -- Frontal lobe and executive function deficit following -- Impact on safety and function Severe aspiration risk   CHL IP TREATMENT RECOMMENDATION 04/25/2020 Treatment Recommendations Therapy as outlined in treatment plan below   No flowsheet data found. CHL IP DIET  RECOMMENDATION 04/25/2020 SLP Diet Recommendations NPO Liquid Administration via -- Medication Administration -- Compensations -- Postural Changes --   CHL IP OTHER RECOMMENDATIONS 04/25/2020 Recommended Consults -- Oral Care Recommendations Oral care QID Other Recommendations Have oral suction available   CHL IP FOLLOW UP RECOMMENDATIONS 04/25/2020 Follow up Recommendations Other (comment)   CHL IP FREQUENCY AND DURATION 04/25/2020 Speech Therapy Frequency (ACUTE ONLY) min 2x/week Treatment Duration 2 weeks      CHL IP ORAL PHASE 04/25/2020 Oral Phase WFL Oral - Pudding Teaspoon -- Oral - Pudding Cup -- Oral - Honey Teaspoon -- Oral - Honey Cup -- Oral - Nectar Teaspoon -- Oral - Nectar Cup -- Oral - Nectar Straw --  Oral - Thin Teaspoon -- Oral - Thin Cup -- Oral - Thin Straw -- Oral - Puree -- Oral - Mech Soft -- Oral - Regular -- Oral - Multi-Consistency -- Oral - Pill -- Oral Phase - Comment --  CHL IP PHARYNGEAL PHASE 04/25/2020 Pharyngeal Phase Impaired Pharyngeal- Pudding Teaspoon -- Pharyngeal -- Pharyngeal- Pudding Cup -- Pharyngeal -- Pharyngeal- Honey Teaspoon -- Pharyngeal -- Pharyngeal- Honey Cup -- Pharyngeal -- Pharyngeal- Nectar Teaspoon Reduced epiglottic inversion;Reduced anterior laryngeal mobility;Reduced laryngeal elevation;Reduced airway/laryngeal closure;Penetration/Aspiration before swallow;Significant aspiration (Amount);Pharyngeal residue - pyriform;Pharyngeal residue - valleculae Pharyngeal Material enters airway, passes BELOW cords and not ejected out despite cough attempt by patient Pharyngeal- Nectar Cup -- Pharyngeal -- Pharyngeal- Nectar Straw -- Pharyngeal -- Pharyngeal- Thin Teaspoon -- Pharyngeal -- Pharyngeal- Thin Cup -- Pharyngeal -- Pharyngeal- Thin Straw Reduced epiglottic inversion;Reduced anterior laryngeal mobility;Reduced laryngeal elevation;Reduced airway/laryngeal closure;Penetration/Aspiration before swallow;Significant aspiration (Amount);Pharyngeal residue - pyriform;Pharyngeal residue - valleculae Pharyngeal Material enters airway, passes BELOW cords and not ejected out despite cough attempt by patient Pharyngeal- Puree -- Pharyngeal -- Pharyngeal- Mechanical Soft -- Pharyngeal -- Pharyngeal- Regular -- Pharyngeal -- Pharyngeal- Multi-consistency -- Pharyngeal -- Pharyngeal- Pill -- Pharyngeal -- Pharyngeal Comment --  No flowsheet data found. Juan Quam Laurice 04/25/2020, 11:50 AM                 IMPRESSION/PLAN: Laryngeal cancer  This is a delightful patient with head and neck cancer. I recommend definitive radiotherapy for this patient.  Anticipate concurrent chemotherapy.  His alternative would be surgery and I anticipate that this will be a laryngectomy.  We will  refer him back to otolaryngology so that he can have a conversation about the details risks benefits and side effects of surgery so he can make an informed decision.  I explained the patient and his family that this would most likely need to be followed by adjuvant radiation at least.  Of note PET scan is pending for full staging so recommendations were preliminary today..  We discussed the potential risks, benefits, and side effects of radiotherapy. We talked in detail about acute and late effects. We discussed that some of the most bothersome acute effects may be mucositis, dysgeusia, salivary changes, skin irritation, hair loss, dehydration, weight loss and fatigue. We talked about late effects which include but are not necessarily limited to dysphagia, hypothyroidism, nerve injury, vascular injury, spinal cord injury, xerostomia, trismus, neck edema, and potential injury to any of the tissues in the head and neck region. No guarantees of treatment were given. A consent form was signed and placed in the patient's medical record. The patient is enthusiastic about proceeding with treatment. I look forward to participating in the patient's care.    Simulation (treatment planning) will take place after the patient makes his decision regarding  modality of care.  He also anticipates full extractions in the near future by dentistry  We also discussed that the treatment of head and neck cancer is a multidisciplinary process to maximize treatment outcomes and quality of life. For this reason the following referrals have been or will be made:   Medical oncology to discuss chemotherapy    Dentistry for dental evaluation, possible extractions in the radiation fields, and /or advice on reducing risk of cavities, osteoradionecrosis, or other oral issues.  I do not anticipate [address] line to get close to his tooth roots but he has poor dentition and anticipates extractions in the near future, full extractions.    Nutritionist for nutrition support during and after treatment.   Speech language pathology for swallowing and/or speech therapy.   Social work for social support.    Physical therapy due to risk of lymphedema in neck and deconditioning.  Patient and family were educated that his cancer is likely related to smoking and alcohol use.  Urged to stop smoking marijuana which is the drug that he is currently using.   On date of service, in total, I spent 60 minutes on this encounter. Patient was seen in person.  __________________________________________   Eppie Gibson, MD  This document serves as a record of services personally performed by Eppie Gibson, MD. It was created on his behalf by Clerance Lav, a trained medical scribe. The creation of this record is based on the scribe's personal observations and the provider's statements to them. This document has been checked and approved by the attending provider.

## 2020-05-21 NOTE — Progress Notes (Signed)
[place] Work  Initial Assessment   Mario Peters is a 55 y.o. year old male contacted by phone. Spoke w sister/caregiver as patient is unable to fully participate on phone.  Clinical Social Work was referred by radiation oncology for assessment of psychosocial needs.   SDOH (Social Determinants of Health) assessments performed: Yes   Distress Screen completed: Yes ONCBCN DISTRESS SCREENING 05/21/2020  Screening Type Initial Screening  Distress experienced in past week (1-10) 10  Practical problem type Food  Emotional problem type Nervousness/Anxiety;Adjusting to illness  Spiritual/Religous concerns type Relating to God  Information Concerns Type Lack of info about treatment;Lack of info about complementary therapy choices  Physical Problem type Talking;Constipation/diarrhea;Changes in urination;Sexual problems  Physician notified of physical symptoms Yes  Referral to clinical psychology No  Referral to clinical social work Yes  Referral to dietition Yes  Referral to financial advocate Yes  Referral to support programs Yes  Referral to palliative care No      Family/Social Information:  . Housing Arrangement: patient lives with sister, "he has a house that is literally no good", feels living situation is not conducive to his recovery from substances.    Has feeding tube and trach. . Family members/support persons in your life? Sister very supportive.  Is married to wife who has multiple medical and mental health issues.   . Transportation concerns: Sister will transport  . 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 Income source: odd jobs . Financial concerns: Yes, current concerns o Type of concern: running out of supplies needed for trach and feeding tube care . Food access concerns: They have syringes to clean tube feeds, has 4 cases of tube feeding supplies which were  mailed to them.  Needs the wand that goes on the end of suction machine - this will be needed to maintain trach.  Sister is concerned that if saliva becomes thicker, this will be very difficult to maintain.  Suction  Machine overheats while in car coming to/from [facility] appointments.   . Religious or spiritual practice: none noted Medication Concerns: have not gotten all supplies needed to care for patient.    Services Currently in place:  Was referred to [facility] for feeding tube supplied, [place] private duty nursing for trach care.  Sister has been caring for him at home with limited supplies.    Coping/ Adjustment to diagnosis: . Patient understands treatment plan and what happens next? Newly diagnosed w laryngeal cancer, he is now able to take care of all his ADLs. Needs help with trach and tube feeding cleaning and maintenance.  Can walk and uses walker.  Uses wheelchair for long distances.  Uses walker at home. Has hospital bed and bedside commode.   . Concerns about diagnosis and/or treatment: per sister, his main concerns are supplies needed to care for his trach and feeding tube.  He was referred to [facility] for trach support and [facility] for needed DME.  Per sister, these referrals have not connected with him to date.  . Patient reported stressors: Adjusting to my illness, he is learning to be more independent and participate in his care . Hopes and priorities: manage his self care needs throughout treatment . Patient enjoys being outside . 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  Has quit smoking  since diagnosis w help of  nicotine patch    SUMMARY: Current SDOH Barriers:  . ADL IADL limitations and may not be linked w referrals made while inpatient per sister. Was referred for [facility] trach care nursing, have not heard from this agency to date.  Got DME ordered while inpatient.   Have some limited supplies for trach care, concerned about suction wands, do not have sufficient supply of these   Clinical Social Work Clinical Goal(s):  . patient will work with SW to address concerns related to access to care and supplies  Interventions: . Discussed common feeling and emotions when being diagnosed with cancer, and the importance of support during treatment . Informed patient of the support team roles and support services at [facility] . Provided CSW contact information and encouraged patient to call with any questions or concerns   Follow Up Plan: CSW will see patient on scheduled visit to [facility] [place] Patient verbalizes understanding of plan: Yes (sister)    Beverely Pace , [place], [place] Worker Phone:  [phone]

## 2020-05-22 ENCOUNTER — Telehealth: Payer: Self-pay | Admitting: *Deleted

## 2020-05-22 ENCOUNTER — Encounter: Payer: Self-pay | Admitting: Radiation Oncology

## 2020-05-22 ENCOUNTER — Encounter: Payer: Self-pay | Admitting: General Practice

## 2020-05-22 ENCOUNTER — Ambulatory Visit (HOSPITAL_COMMUNITY)
Admission: RE | Admit: 2020-05-22 | Discharge: 2020-05-22 | Disposition: A | Discharge status: Home / Self Care | Payer: Medicaid Other | Source: Ambulatory Visit | Attending: Hematology and Oncology | Admitting: Hematology and Oncology

## 2020-05-22 DIAGNOSIS — C329 Malignant neoplasm of larynx, unspecified: Secondary | ICD-10-CM | POA: Diagnosis present

## 2020-05-22 LAB — GLUCOSE, CAPILLARY: Glucose-Capillary: 85 mg/dL (ref 70–99)

## 2020-05-22 MED ORDER — FLUDEOXYGLUCOSE F - 18 (FDG) INJECTION: 5.7500 | Freq: Once | INTRAVENOUS | Status: DC

## 2020-05-22 NOTE — Telephone Encounter (Signed)
CALLED PATIENT TO INFORM OF NUTRITION APPT. FOR 05-27-20 @ 11:15 AM, SPOKE WITH PATIENT'S SISTER- MARY AND SHE IS AWARE OF THIS APPT. AND IS GOOD WITH IT

## 2020-05-22 NOTE — Progress Notes (Signed)
[place] CSW Progress Notes  Spoke w [facility] [phone]) re sister's concerns re DME/supplies needed for trach and tube feeding supplies.  They will reach out to sister to determine what is needed and address these needs as possible.  Left VM for Georgina Snell at [facility] re referral made from inpatient for trach care support nursing.  Per sister, they have not heard from [facility] to date.    Edwyna Shell, LCSW Clinical Social Worker Phone:  [phone]

## 2020-05-24 ENCOUNTER — Other Ambulatory Visit: Payer: Self-pay

## 2020-05-24 ENCOUNTER — Ambulatory Visit (HOSPITAL_COMMUNITY)
Admission: RE | Admit: 2020-05-24 | Discharge: 2020-05-24 | Disposition: A | Discharge status: Home / Self Care | Payer: Medicaid Other | Source: Ambulatory Visit | Attending: Hematology and Oncology | Admitting: Hematology and Oncology

## 2020-05-24 DIAGNOSIS — C320 Malignant neoplasm of glottis: Secondary | ICD-10-CM | POA: Insufficient documentation

## 2020-05-24 DIAGNOSIS — R1313 Dysphagia, pharyngeal phase: Secondary | ICD-10-CM | POA: Insufficient documentation

## 2020-05-24 NOTE — Progress Notes (Signed)
Modified Barium Swallow Progress Note  Patient Details  Name: Mario Peters MRN: [id number] Date of Birth: 10-Jan-1966  Today's Date: 05/24/2020  Modified Barium Swallow completed.  Full report located under Chart Review in the Imaging Section.  Brief recommendations include the following:  Clinical Impression  Pt continues to exhibit severe pharyngeal dysphagia marked by decreased anterior hyoid excursion, laryngeal elevation and incompetent epiglottic deflection in setting of laryngeal mass. MBS performed without PMV in place due to air trapping/back pressure noted after donned for 1 minute and pt does not wear valve at home except periods of phonation. Aspiration prior to the swallow of thin and duirng with significant vallecular and pyriform sinus residue. Verbal cues for multiple swallows and additional thin did not clear. Valve was placed briefly to provide additional pressure for cough. He was able to cough and expectorate pharyngeal residue into oral cavity. Pt to continue recommendation of NPO, use G-tube for nourishment and frequent oral care. Depending on plan of treatment, he will need follow up for dysphagia.   Swallow Evaluation Recommendations       SLP Diet Recommendations: NPO       Medication Administration: Via alternative means               Oral Care Recommendations: Oral care QID        Mario Peters 05/24/2020,3:03 PM   Mario Peters.Ed Risk analyst [phone] Office [phone]

## 2020-05-24 NOTE — Progress Notes (Signed)
Oncology Nurse Navigator Documentation  I've attempted several times today to contact Mr. Rivero' sister [name] to discuss his care and the recommendations of Dr. Isidore Moos and Dr. Chryl Heck. They both recommend Mr. Sawyer receive chemo/radiation to treat his cancer. He is scheduled with Dr. Constance Holster on 5/12 to discuss surgery as an option, but as above chemo/radiation may be his best option per Dr. Isidore Moos and Dr. Chryl Heck. Dr. Constance Holster is willing to discuss with Mr. Wittwer' sister on the phone his recommendation as well to help the process of scheduling treatments here at [facility] as quickly as possible. I am unable to leave a voicemail on Mr. Scholle' sisters phone due to her voice mail not being set up. Dr. Benson Norway of Dental medicine has also been contacted regarding possible dental extractions next week ahead of radiation treatment if needed and patient is agreeable.   Harlow Asa RN, BSN, OCN Head & Neck Oncology Nurse [place] at [facility] Phone # [phone]  Fax # [phone]

## 2020-05-27 ENCOUNTER — Other Ambulatory Visit: Payer: Self-pay | Admitting: Hematology and Oncology

## 2020-05-27 ENCOUNTER — Inpatient Hospital Stay: Payer: Medicaid Other | Attending: Radiation Oncology | Admitting: Nutrition

## 2020-05-27 ENCOUNTER — Other Ambulatory Visit (HOSPITAL_COMMUNITY): Payer: Self-pay

## 2020-05-27 ENCOUNTER — Other Ambulatory Visit: Payer: Self-pay

## 2020-05-27 DIAGNOSIS — C320 Malignant neoplasm of glottis: Secondary | ICD-10-CM

## 2020-05-27 MED ORDER — LORAZEPAM 0.5 MG PO TABS
0.5000 mg | ORAL_TABLET | Freq: Two times a day (BID) | ORAL | 0 refills | Status: AC | PRN
  Filled 2020-05-27: qty 30, 15d supply, fill #0

## 2020-05-27 MED ORDER — PROCHLORPERAZINE MALEATE 10 MG PO TABS: 10.0000 mg | ORAL_TABLET | Freq: Four times a day (QID) | ORAL | 1 refills | Status: DC | PRN

## 2020-05-27 MED ORDER — ONDANSETRON HCL 8 MG PO TABS: 8.0000 mg | ORAL_TABLET | Freq: Two times a day (BID) | ORAL | 1 refills | Status: DC | PRN

## 2020-05-27 MED ORDER — DEXAMETHASONE 4 MG PO TABS: 8.0000 mg | ORAL_TABLET | Freq: Every day | ORAL | 1 refills | Status: DC

## 2020-05-27 NOTE — Progress Notes (Signed)
START ON PATHWAY REGIMEN - Head and Neck     A cycle is every 7 days:     Cisplatin   **Always confirm dose/schedule in your pharmacy ordering system**  Patient Characteristics: Larynx, Preoperative or Nonsurgical Candidate, Stage III - IVB Disease Classification: Larynx AJCC T Category: T3 AJCC 8 Stage Grouping: IVA Therapeutic Status: Preoperative or Nonsurgical Candidate AJCC N Category: cN2c AJCC M Category: M0 Intent of Therapy: Curative Intent, Discussed with Patient

## 2020-05-27 NOTE — Progress Notes (Signed)
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 Chryl Heck and Dr. Isidore Moos.  Recommendations are for both chemotherapy and radiation therapy but he has not started any treatment yet.  Past medical history includes severe dysphagia status post PEG, trach, polysubstance abuse.  Medications include Senokot, Decadron, Ativan, Zofran, Compazine.  Labs were reviewed.  Height: 5 feet 7 inches. Weight: 116.6 pounds. BMI: 18.26.  I met with patient and his sister, [name].  Patient is status post MBS on May 6 with recommendations for n.p.o. and and G tube usage only.  Patient does not like having the feeding tube but he is tolerating tube feedings of Osmolite 1.5.  Sister reports patient had been eating up until MBS so was not doing maximum tube feeding.  She reports he is now back to goal rate of 8 cartons Osmolite 1.5 daily.  He is complaining of constipation.  She reports she also infuses 4-16 ounce bottles of water via PEG.  Estimated nutrition needs: 2350-2650 cal, 90-100-100 g protein, 2.6 L fluid. 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  Intervention: Educated on importance of continuing Osmolite 1.5 via feeding tube at goal rate of 8 cartons Osmolite 1.5 daily.  This provides 2840 cal, 120 g protein, 1448 mL free water. Flush feeding tube with 60 mL free water before and after boluses 4 times a day.  Give additional 240 mL free water 3 times daily. Total of 2648 mL free water. Educated on importance of remaining n.p.o. Educated on ordering tube feeding when needed.  Monitoring, evaluation, goals: Patient will tolerate adequate calories and protein to minimize weight loss throughout treatment.  Next visit: Monday, June 6 during infusion.  **Disclaimer: This note was dictated with voice recognition software. Similar sounding words can inadvertently be  transcribed and this note may contain transcription errors which may not have been corrected upon publication of note.**

## 2020-05-29 ENCOUNTER — Inpatient Hospital Stay (HOSPITAL_BASED_OUTPATIENT_CLINIC_OR_DEPARTMENT_OTHER): Payer: Medicaid Other | Admitting: Hematology and Oncology

## 2020-05-29 ENCOUNTER — Encounter: Payer: Self-pay | Admitting: Hematology and Oncology

## 2020-05-29 ENCOUNTER — Other Ambulatory Visit: Payer: Self-pay

## 2020-05-29 ENCOUNTER — Encounter (HOSPITAL_COMMUNITY): Payer: Self-pay | Admitting: Dentistry

## 2020-05-29 ENCOUNTER — Other Ambulatory Visit (HOSPITAL_COMMUNITY)
Admission: RE | Admit: 2020-05-29 | Discharge: 2020-05-29 | Disposition: A | Discharge status: Home / Self Care | Payer: Medicaid Other | Source: Ambulatory Visit | Attending: Hematology and Oncology | Admitting: Hematology and Oncology

## 2020-05-29 VITALS — BP 127/86 | HR 98 | Temp 97.5°F | Resp 18 | Ht 67.0 in | Wt 119.4 lb

## 2020-05-29 DIAGNOSIS — R5383 Other fatigue: Secondary | ICD-10-CM | POA: Diagnosis not present

## 2020-05-29 DIAGNOSIS — C329 Malignant neoplasm of larynx, unspecified: Secondary | ICD-10-CM | POA: Insufficient documentation

## 2020-05-29 DIAGNOSIS — R634 Abnormal weight loss: Secondary | ICD-10-CM | POA: Insufficient documentation

## 2020-05-29 DIAGNOSIS — Z930 Tracheostomy status: Secondary | ICD-10-CM | POA: Insufficient documentation

## 2020-05-29 DIAGNOSIS — Z1152 Encounter for screening for COVID-19: Secondary | ICD-10-CM | POA: Diagnosis not present

## 2020-05-29 DIAGNOSIS — Z8249 Family history of ischemic heart disease and other diseases of the circulatory system: Secondary | ICD-10-CM | POA: Diagnosis not present

## 2020-05-29 DIAGNOSIS — R1310 Dysphagia, unspecified: Secondary | ICD-10-CM | POA: Diagnosis not present

## 2020-05-29 DIAGNOSIS — Z01812 Encounter for preprocedural laboratory examination: Secondary | ICD-10-CM | POA: Insufficient documentation

## 2020-05-29 DIAGNOSIS — Z430 Encounter for attention to tracheostomy: Secondary | ICD-10-CM

## 2020-05-29 DIAGNOSIS — C320 Malignant neoplasm of glottis: Secondary | ICD-10-CM | POA: Diagnosis not present

## 2020-05-29 DIAGNOSIS — F17210 Nicotine dependence, cigarettes, uncomplicated: Secondary | ICD-10-CM | POA: Insufficient documentation

## 2020-05-29 DIAGNOSIS — Z79899 Other long term (current) drug therapy: Secondary | ICD-10-CM | POA: Insufficient documentation

## 2020-05-29 DIAGNOSIS — Z20822 Contact with and (suspected) exposure to covid-19: Secondary | ICD-10-CM | POA: Insufficient documentation

## 2020-05-29 LAB — SARS CORONAVIRUS 2 (TAT 6-24 HRS): SARS Coronavirus 2: NEGATIVE

## 2020-05-29 NOTE — Assessment & Plan Note (Signed)
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  After his last visit, he wanted to talk to the surgeon 1 more time to discuss the role of surgery.  He had a recent telephone discussion with Dr. Constance Holster and he did not want to proceed with laryngectomy hence we are preparing for chemotherapy and radiation concurrently with a curative intent.  We have discussed again about cisplatin, weekly regimen, mechanism of action, adverse effects including but not limited to fatigue, nausea, vomiting, ototoxicity, nephrotoxicity, increased risk of infections etc.  He understands that some of the side effects can be permanent and life-threatening.  Anticipate initiation of treatment on 526.  We have already made infusion appointment and coordinated with radiation oncology team.  We will arrange for a port, audiology referral and chemo education.  He will return to clinic for follow-up and labs the day before his first chemotherapy.

## 2020-05-29 NOTE — Progress Notes (Signed)
EKG: na CXR: na ECHO: denies Stress Test: denies Cardiac Cath: denies  Fasting Blood Sugar- na Checks Blood Sugar__na_ times a day  OSA/CPAP: No  ASA/Blood Thinners: No  Covid test 05/29/20 results pending  Anesthesia Review: No  Patient denies shortness of breath, fever, cough, and chest pain at PAT appointment.  Patient verbalized understanding of instructions provided today at the PAT appointment.  Patient asked to review instructions at home and day of surgery.

## 2020-05-29 NOTE — Progress Notes (Signed)
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  After his last visit, he wanted to talk to the surgeon 1 more time to discuss the role of surgery.  He had a recent telephone discussion with Dr. Constance Holster and he did not want to proceed with laryngectomy hence we are preparing for chemotherapy and radiation concurrently with a curative intent.  We have discussed again about cisplatin, weekly regimen, mechanism of action, adverse effects including but not limited to fatigue, nausea, vomiting, ototoxicity, nephrotoxicity, increased risk of infections etc.  He understands that some of the side effects can be permanent and life-threatening.  Anticipate initiation of treatment on 526.  We have already made infusion appointment and coordinated with radiation oncology team.  We will arrange for a port, audiology referral and chemo education.  He will return to clinic for follow-up and labs the day before his first chemotherapy.  Tracheostomy care ([place]) I did not see any evidence of infection around the tracheostomy tube today.  However I am concerned about the progression of tumor in his neck.  Orders Placed This Encounter  Procedures  . IR IMAGING GUIDED PORT INSERTION    Standing Status:   Future    Standing Expiration Date:   05/29/2021    Order  Specific Question:   Reason for Exam (SYMPTOM  OR DIAGNOSIS REQUIRED)    Answer:   chemotherapy administration    Order Specific Question:   Preferred Imaging Location?    Answer:   [facility]     HISTORY OF PRESENTING ILLNESS:   Mario Peters 55 y.o. male is here because of newly diagnosed laryngeal SCC  Oncology History Overview Note  Mario Peters is a 56 year old male with a past medical history significant for alcohol tobacco abuse, and history of seizures.  The patient presented with severe dysphagia, cough, fatigue.  He has had significant weight loss recently and hoarseness of his voice.  Symptoms present for approximately 4 months.  CT of the chest on admission showed diffuse bilateral pulmonary parenchymal changes consistent with multifocal pneumonia possible related to aspiration.  CT of the neck showed mildly hyperdense focus at the posterior aspect of the left vocal fold with associated soft tissue thickening which could be laryngeal neoplasm.  He was seen by ENT and flex fiberoptic lower discopathy was performed showing a large partially obstructing laryngeal tumor.  He was taken to the OR on 04/22/2020 underwent tracheostomy and biopsy.  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  P16 negative.   He is status post G-tube placement by IR on 04/24/2020.   He was initially seen in the hospital. Recommendation was to proceed with outpatient PET CT and return to follow up with medical oncology outpatient. PET CT without any clear evidence of metastatic disease.   Glottis carcinoma ([place])  05/08/2020 Initial Diagnosis   Glottis carcinoma ([place])   05/08/2020 Cancer Staging  Staging form: Larynx - Glottis, AJCC 8th Edition - Clinical stage from 05/08/2020: Stage IVA (cT3, cN2c, cM0) - Signed by Benay Pike, MD on 05/27/2020 Stage prefix: Initial diagnosis   06/03/2020 -  Chemotherapy    Patient is on Treatment Plan: HEAD/NECK  CISPLATIN Q7D       Interval History  Mario Peters is here for a follow up to discuss concurrent CRT. Since his last visit, he had a telephone appointment with Dr. Constance Holster her ENT surgeon who recommended that his options are total laryngectomy versus chemoradiation.  Patient is very anxious about having his voicebox removed and wanted to proceed with chemotherapy and radiation and hence he made this appointment for follow-up. He has gained some weight since his last visit, apparently he was eating some food by mouth which he was recommended not to due to risk of aspiration.  He is currently feeding himself via the G-tube.  He is still very hungry and wants to eat.  He says that his neck has swollen since the last visit and he was wondering if this is from the trach tube versus the tumor.  No pain issues.  No change in breathing.  Trach and PEG in place.  Rest of the pertinent 10 point ROS reviewed and negative.  REVIEW OF SYSTEMS:   Constitutional: Denies fevers, chills or abnormal night sweats Eyes: Denies blurriness of vision, double vision or watery eyes Ears, nose, mouth, throat, and face: Denies mucositis or sore throat Respiratory: Denies cough, dyspnea or wheezes Cardiovascular: Denies palpitation, chest discomfort or lower extremity swelling Gastrointestinal:  Denies nausea, heartburn or change in bowel habits Skin: Denies abnormal skin rashes Lymphatics: Denies new lymphadenopathy or easy bruising Neurological:Denies numbness, tingling or new weaknesses Behavioral/Psych: Mood is stable, no new changes  All other systems were reviewed with the patient and are negative.  MEDICAL HISTORY:  Past Medical History:  Diagnosis Date  . Seizures ([place])     SURGICAL HISTORY: Past Surgical History:  Procedure Laterality Date  . DIRECT LARYNGOSCOPY N/A 04/22/2020   Procedure: DIRECT LARYNGOSCOPY WITH BIOPSY;  Surgeon: [name], MD;  Location: [place];  Service: ENT;  Laterality: N/A;  .  ESOPHAGOSCOPY  04/22/2020   Procedure: ESOPHAGOSCOPY;  Surgeon: [name], MD;  Location: [place];  Service: ENT;;  . IR GASTROSTOMY TUBE MOD SED  04/24/2020  . TRACHEOSTOMY TUBE PLACEMENT N/A 04/22/2020   Procedure: TRACHEOSTOMY;  Surgeon: [name], MD;  Location: [place];  Service: ENT;  Laterality: N/A;    SOCIAL HISTORY: Social History   Socioeconomic History  . Marital status: Married    Spouse name: Not on file  . Number of children: Not on file  . Years of education: Not on file  . Highest education level: Not on file  Occupational History  . Not on file  Tobacco Use  . Smoking status: Current Every Day Smoker  . Smokeless tobacco: Never Used  Vaping Use  . Vaping Use: Never used  Substance and Sexual Activity  . Alcohol use: Not Currently  . Drug use: Never  . Sexual activity: Not on file  Other Topics Concern  . Not on file  Social History Narrative  . Not on file   Social Determinants of Health   Financial Resource Strain: Not on file  Food Insecurity: Not on file  Transportation Needs: Not on file  Physical Activity: Not on file  Stress: Not on file  Social Connections: Not on file  Intimate Partner Violence: Not on  file    FAMILY HISTORY: Family History  Problem Relation Age of Onset  . Hypertension Other     ALLERGIES:  has No Known Allergies.  MEDICATIONS:  Current Outpatient Medications  Medication Sig Dispense Refill  . acetaminophen (TYLENOL) 325 MG tablet Take 2 tablets (650 mg total) by mouth every 6 (six) hours as needed for mild pain (or Fever >/= 101).    . chlorhexidine (PERIDEX) 0.12 % solution Rinse as directed with 47mls twice daily for 14 days. 473 mL 0  . nicotine (NICODERM CQ - DOSED IN MG/24 HOURS) 21 mg/24hr patch Place 1 patch (21 mg total) onto the skin daily. 28 patch 0  . Nutritional Supplements (FEEDING SUPPLEMENT, OSMOLITE 1.5 CAL,) LIQD Place 474 mLs into feeding tube 4 (four) times daily.  0  . dexamethasone (DECADRON) 4 MG  tablet Take 2 tablets (8 mg total) by mouth daily. Take daily x 3 days starting the day after cisplatin chemotherapy. Take with food. 30 tablet 1  . LORazepam (ATIVAN) 0.5 MG tablet Take 1 tablet (0.5 mg total) by mouth every 12 (twelve) hours as needed for anxiety. 30 tablet 0  . ondansetron (ZOFRAN) 8 MG tablet Take 1 tablet (8 mg total) by mouth 2 (two) times daily as needed. Start on the third day after cisplatin chemotherapy. 30 tablet 1  . prochlorperazine (COMPAZINE) 10 MG tablet Take 1 tablet (10 mg total) by mouth every 6 (six) hours as needed (Nausea or vomiting). 30 tablet 1  . senna-docusate (SENOKOT-S) 8.6-50 MG tablet Place 1 tablet into feeding tube 2 (two) times daily. 60 tablet 0   No current facility-administered medications for this visit.    PHYSICAL EXAMINATION:  ECOG PERFORMANCE STATUS: 1 - Symptomatic but completely ambulatory  Vitals:   05/29/20 0852  BP: 127/86  Pulse: 98  Resp: 18  Temp: (!) 97.5 F (36.4 C)   Filed Weights   05/29/20 0852  Weight: 119 lb 6.4 oz (54.2 kg)    Physical Exam Constitutional:      Appearance: Normal appearance.  HENT:     Head: Normocephalic and atraumatic.     Mouth/Throat:     Mouth: Mucous membranes are moist.  Neck:     Comments: Trach in place. Cardiovascular:     Rate and Rhythm: Normal rate and regular rhythm.     Pulses: Normal pulses.     Heart sounds: Normal heart sounds.  Pulmonary:     Effort: Pulmonary effort is normal.     Breath sounds: Normal breath sounds.  Abdominal:     General: Abdomen is flat.     Palpations: Abdomen is soft.     Comments: G-tube in place  Musculoskeletal:        General: No swelling or tenderness.  Lymphadenopathy:     Cervical: Cervical adenopathy (Bilateral adenopathy appears much more prominent than last visit suggestive of tumor progression) present.  Skin:    General: Skin is warm and dry.  Neurological:     General: No focal deficit present.     Mental Status: He  is alert.  Psychiatric:        Mood and Affect: Mood normal.        Behavior: Behavior normal.       LABORATORY DATA:  I have reviewed the data as listed Lab Results  Component Value Date   WBC 7.6 05/01/2020   HGB 14.4 05/01/2020   HCT 44.3 05/01/2020   MCV 91.5 05/01/2020   PLT 391  05/01/2020     Chemistry      Component Value Date/Time   NA 130 (L) 05/01/2020 0524   K 3.7 05/01/2020 0524   CL 93 (L) 05/01/2020 0524   CO2 31 05/01/2020 0524   BUN 21 (H) 05/01/2020 0524   CREATININE 0.52 (L) 05/01/2020 0524      Component Value Date/Time   CALCIUM 9.0 05/01/2020 0524   ALKPHOS 58 04/29/2020 0116   AST 29 04/29/2020 0116   ALT 19 04/29/2020 0116   BILITOT 0.5 04/29/2020 0116       RADIOGRAPHIC STUDIES: I have personally reviewed the radiological images as listed and agreed with the findings in the report. NM PET Image Initial (PI) Skull Base To Thigh  Result Date: 05/23/2020 CLINICAL DATA:  Initial treatment strategy for head and neck carcinoma. Squamous cell carcinoma the LEFT glottis. Tracheostomy 05/02/2020. EXAM: NUCLEAR MEDICINE PET SKULL BASE TO THIGH TECHNIQUE: 5.7 mCi F-18 FDG was injected intravenously. Full-ring PET imaging was performed from the skull base to thigh after the radiotracer. CT data was obtained and used for attenuation correction and anatomic localization. Fasting blood glucose: 85 mg/dl COMPARISON:  Neck CT 04/19/2030 FINDINGS: Mediastinal blood pool activity: SUV max 1.74 Liver activity: SUV max 2.7 NECK: Intense metabolic activity in the glottic and supraglottic tissue with SUV max equal 11.5. Activity involves LEFT and RIGHT glottis. Activity extends superiorly on the RIGHT to the vallecula with SUV max equal 9.5. Additionally, there is asymmetric hypermetabolic activity associated with the LEFT palatine tonsil region with SUV max equal 6. 2 on image 22. Bilateral hypermetabolic level 2 lymph nodes beneath the sternocleidomastoid muscles nodes  are difficult to define on noncontrast exam but activity is intense with SUV max equal 6.3 on the LEFT (image 35) and with SUV max equal 10.2 on the RIGHT ( image 37). Incidental CT findings: Metabolic activity associated with tracheostomy tube tract consider inflammatory. CHEST: Precarinal node measures 10 mm (image 71/4 ) with SUV max equal 2.6 which is slightly above background blood pool activity. No hilar activity. Small 3 mm RIGHT upper lobe nodule (image 70/CT series 4) with no metabolic activity. Small subpleural nodule in the RIGHT lower lobe measures 2 mm image 81. Smudgy subpleural nodule RIGHT upper lobe measuring 3 mm image 64. There is mild ground-glass densities in the RIGHT lower lobe (image 94) which appear infectious. Near complete resolution of diffuse bilateral nodularity seen on CT 03/21/2020. Incidental CT findings: none ABDOMEN/PELVIS: No hypermetabolic activity in the liver. No hypermetabolic abdominopelvic lymph nodes. Benign activity associated with the percutaneous gastrostomy tube. Incidental CT findings: none SKELETON: No focal hypermetabolic activity to suggest skeletal metastasis. Incidental CT findings: none IMPRESSION: 1. Intense metabolic activity associated with the bilateral glottis and supraglottic tissue extending superiorly on the RIGHT to the level of the vallecula. 2. Asymmetric hypermetabolic activity associated with the LEFT palatine tonsil region. 3. Bilateral hypermetabolic level II lymph nodes beneath the sternocleidomastoid muscles consistent with metastatic adenopathy. 4. Metabolic activity associated with the tracheostomy tube track is favored benign. 5. Small subcarinal lymph node with mild metabolic activity is favored benign. 6. Three small pulmonary nodules in the RIGHT lung are indeterminate. Extensive pulmonary nodularity on CT from 04/17/2020 as no clear resolved. Recommend attention on routine surveillance. Electronically Signed   By: Suzy Bouchard M.D.    On: 05/23/2020 08:59   DG Swallowing Func-Speech Pathology  Result Date: 05/24/2020 Objective Swallowing Evaluation: Type of Study: MBS-Modified Barium Swallow Study  Patient Details Name: Khole  Lovett MRN: [id number] Date of Birth: July 17, 1965 Today's Date: 05/24/2020 Time: SLP Start Time (ACUTE ONLY): 1136 -SLP Stop Time (ACUTE ONLY): 1202 SLP Time Calculation (min) (ACUTE ONLY): 26 min Past Medical History: Past Medical History: Diagnosis Date . Seizures ([place])  Past Surgical History: Past Surgical History: Procedure Laterality Date . DIRECT LARYNGOSCOPY N/A 04/22/2020  Procedure: DIRECT LARYNGOSCOPY WITH BIOPSY;  Surgeon: [name], MD;  Location: [place];  Service: ENT;  Laterality: N/A; . ESOPHAGOSCOPY  04/22/2020  Procedure: ESOPHAGOSCOPY;  Surgeon: [name], MD;  Location: [place];  Service: ENT;; . IR GASTROSTOMY TUBE MOD SED  04/24/2020 . TRACHEOSTOMY TUBE PLACEMENT N/A 04/22/2020  Procedure: TRACHEOSTOMY;  Surgeon: [name], MD;  Location: [place];  Service: ENT;  Laterality: N/A; HPI: Pt seen for outpatient MBS accompanied by his sister. PMH: EtOH abuse,  tobacco abuse, remote hx of seizures. Pt hospitalized 04/2018 and laryngeal neoplasm suspected and pathology confirmed squamous cell carcinoma. Received trach 4/4 and has G-tube.  Pt's sister reports pt has an appointment next week to decide on surgery/laryngectomy and/or radiation. Trach in place. MBS 04/25/20 revealed severe aspiration and NPO recommended. Per pt's sister, "a doctor recommended to start food/liquid" and pt has been eating and drinking for 8 days. He has been coughing with po's and MBS recommended today.  Subjective: -- (alert, talking with PMV placed intermittently) Assessment / Plan / Recommendation CHL IP CLINICAL IMPRESSIONS 05/24/2020 Clinical Impression Pt continues to exhibit severe pharyngeal dysphagia marked by decreased anterior hyoid excursion, laryngeal elevation and incompetent epiglottic deflection in setting of laryngeal mass.  MBS performed without PMV in place due to air trapping/back pressure noted after donned for 1 minute and pt does not wear valve at home except periods of phonation. Aspiration prior to the swallow of thin and duirng with significant vallecular and pyriform sinus residue. Verbal cues for multiple swallows and additional thin did not clear. Valve was placed briefly to provide additional pressure for cough. He was able to cough and expectorate pharyngeal residue into oral cavity. Pt to continue recommendation of NPO, use G-tube for nourishment and frequent oral care. Depending on plan of treatment, he will need follow up for dysphagia. SLP Visit Diagnosis Dysphagia, pharyngeal phase (R13.13) Attention and concentration deficit following -- Frontal lobe and executive function deficit following -- Impact on safety and function Severe aspiration risk   CHL IP TREATMENT RECOMMENDATION 05/24/2020 Treatment Recommendations Defer treatment plan to f/u with SLP   No flowsheet data found. CHL IP DIET RECOMMENDATION 05/24/2020 SLP Diet Recommendations NPO Liquid Administration via -- Medication Administration Via alternative means Compensations -- Postural Changes --   CHL IP OTHER RECOMMENDATIONS 05/24/2020 Recommended Consults -- Oral Care Recommendations Oral care QID Other Recommendations --   CHL IP FOLLOW UP RECOMMENDATIONS 05/24/2020 Follow up Recommendations Outpatient SLP   CHL IP FREQUENCY AND DURATION 04/25/2020 Speech Therapy Frequency (ACUTE ONLY) min 2x/week Treatment Duration 2 weeks      CHL IP ORAL PHASE 05/24/2020 Oral Phase WFL Oral - Pudding Teaspoon -- Oral - Pudding Cup -- Oral - Honey Teaspoon -- Oral - Honey Cup -- Oral - Nectar Teaspoon -- Oral - Nectar Cup -- Oral - Nectar Straw -- Oral - Thin Teaspoon -- Oral - Thin Cup -- Oral - Thin Straw -- Oral - Puree -- Oral - Mech Soft -- Oral - Regular -- Oral - Multi-Consistency -- Oral - Pill -- Oral Phase - Comment --  CHL IP PHARYNGEAL PHASE 05/24/2020 Pharyngeal Phase  Impaired Pharyngeal- Pudding  Teaspoon -- Pharyngeal -- Pharyngeal- Pudding Cup -- Pharyngeal -- Pharyngeal- Honey Teaspoon -- Pharyngeal -- Pharyngeal- Honey Cup -- Pharyngeal -- Pharyngeal- Nectar Teaspoon -- Pharyngeal -- Pharyngeal- Nectar Cup NT Pharyngeal -- Pharyngeal- Nectar Straw -- Pharyngeal -- Pharyngeal- Thin Teaspoon -- Pharyngeal -- Pharyngeal- Thin Cup Penetration/Aspiration before swallow;Pharyngeal residue - valleculae;Pharyngeal residue - pyriform;Reduced epiglottic inversion;Reduced anterior laryngeal mobility;Reduced laryngeal elevation Pharyngeal Material enters airway, passes BELOW cords without attempt by patient to eject out (silent aspiration) Pharyngeal- Thin Straw -- Pharyngeal -- Pharyngeal- Puree NT Pharyngeal -- Pharyngeal- Mechanical Soft -- Pharyngeal -- Pharyngeal- Regular Pharyngeal residue - valleculae;Pharyngeal residue - pyriform;Reduced anterior laryngeal mobility;Reduced laryngeal elevation Pharyngeal -- Pharyngeal- Multi-consistency -- Pharyngeal -- Pharyngeal- Pill -- Pharyngeal -- Pharyngeal Comment --  CHL IP CERVICAL ESOPHAGEAL PHASE 05/24/2020 Cervical Esophageal Phase WFL Pudding Teaspoon -- Pudding Cup -- Honey Teaspoon -- Honey Cup -- Nectar Teaspoon -- Nectar Cup -- Nectar Straw -- Thin Teaspoon -- Thin Cup -- Thin Straw -- Puree -- Mechanical Soft -- Regular -- Multi-consistency -- Pill -- Cervical Esophageal Comment -- Mario Peters 05/24/2020, 3:02 PM      Orbie Pyo Colvin Caroli.Ed Actor Pager [phone] Office [phone]         I reviewed the PET/CT imaging independently as well as the results.  There is no clear evidence of metastatic disease.  I spent 30 minutes in the care of this patient including history, physical, review of records including PET images, discussion about chemotherapy with cisplatin, mechanism of action, adverse effects including but not limited to fatigue, nausea, vomiting, increased risk of infections,  ototoxicity, nephrotoxicity etc.  I have tried to coordinate his care with radiation oncology team, our schedulers, ENT team as well. All questions were answered. The patient knows to call the clinic with any problems, questions or concerns.     Benay Pike, MD 05/29/2020 1:04 PM

## 2020-05-29 NOTE — Assessment & Plan Note (Signed)
I did not see any evidence of infection around the tracheostomy tube today.  However I am concerned about the progression of tumor in his neck.

## 2020-05-30 ENCOUNTER — Ambulatory Visit (HOSPITAL_COMMUNITY): Payer: Medicaid Other | Admitting: Certified Registered Nurse Anesthetist

## 2020-05-30 ENCOUNTER — Encounter (HOSPITAL_COMMUNITY): Payer: Self-pay | Admitting: Dentistry

## 2020-05-30 ENCOUNTER — Ambulatory Visit (HOSPITAL_COMMUNITY)
Admission: RE | Admit: 2020-05-30 | Discharge: 2020-05-30 | Disposition: A | Discharge status: Home / Self Care | Payer: Medicaid Other | Source: Ambulatory Visit | Attending: Dentistry | Admitting: Dentistry

## 2020-05-30 ENCOUNTER — Other Ambulatory Visit (HOSPITAL_COMMUNITY): Payer: Self-pay

## 2020-05-30 ENCOUNTER — Encounter (HOSPITAL_COMMUNITY)
Admission: RE | Disposition: A | Discharge status: Home / Self Care | Payer: Self-pay | Source: Ambulatory Visit | Attending: Dentistry

## 2020-05-30 ENCOUNTER — Telehealth: Payer: Self-pay | Admitting: Hematology and Oncology

## 2020-05-30 DIAGNOSIS — K083 Retained dental root: Secondary | ICD-10-CM | POA: Diagnosis not present

## 2020-05-30 DIAGNOSIS — Z930 Tracheostomy status: Secondary | ICD-10-CM | POA: Insufficient documentation

## 2020-05-30 DIAGNOSIS — C320 Malignant neoplasm of glottis: Secondary | ICD-10-CM | POA: Diagnosis not present

## 2020-05-30 DIAGNOSIS — K056 Periodontal disease, unspecified: Secondary | ICD-10-CM | POA: Diagnosis not present

## 2020-05-30 DIAGNOSIS — K0530 Chronic periodontitis, unspecified: Secondary | ICD-10-CM

## 2020-05-30 DIAGNOSIS — K045 Chronic apical periodontitis: Secondary | ICD-10-CM

## 2020-05-30 DIAGNOSIS — K029 Dental caries, unspecified: Secondary | ICD-10-CM

## 2020-05-30 HISTORY — DX: Malignant (primary) neoplasm, unspecified: C80.1

## 2020-05-30 HISTORY — PX: MULTIPLE EXTRACTIONS WITH ALVEOLOPLASTY: SHX5342

## 2020-05-30 LAB — POCT I-STAT, CHEM 8
BUN: 8 mg/dL (ref 6–20)
Calcium, Ion: 1.08 mmol/L — ABNORMAL LOW (ref 1.15–1.40)
Chloride: 97 mmol/L — ABNORMAL LOW (ref 98–111)
Creatinine, Ser: 0.3 mg/dL — ABNORMAL LOW (ref 0.61–1.24)
Glucose, Bld: 75 mg/dL (ref 70–99)
HCT: 28 % — ABNORMAL LOW (ref 39.0–52.0)
Hemoglobin: 9.5 g/dL — ABNORMAL LOW (ref 13.0–17.0)
Potassium: 4.4 mmol/L (ref 3.5–5.1)
Sodium: 133 mmol/L — ABNORMAL LOW (ref 135–145)
TCO2: 27 mmol/L (ref 22–32)

## 2020-05-30 SURGERY — MULTIPLE EXTRACTION WITH ALVEOLOPLASTY
Anesthesia: General | Site: Mouth

## 2020-05-30 MED ORDER — HYDROCODONE-ACETAMINOPHEN 5-325 MG PO TABS
1.0000 | ORAL_TABLET | Freq: Four times a day (QID) | ORAL | 0 refills | Status: AC | PRN
  Filled 2020-05-30: qty 12, 3d supply, fill #0

## 2020-05-30 MED ORDER — 0.9 % SODIUM CHLORIDE (POUR BTL) OPTIME
TOPICAL | Status: DC | PRN
  Administered 2020-05-30: 1000 mL

## 2020-05-30 MED ORDER — LIDOCAINE 2% (20 MG/ML) 5 ML SYRINGE
INTRAMUSCULAR | Status: AC
  Filled 2020-05-30: qty 5

## 2020-05-30 MED ORDER — BUPIVACAINE-EPINEPHRINE 0.5% -1:200000 IJ SOLN
INTRAMUSCULAR | Status: DC | PRN
  Administered 2020-05-30: 5.1 mL

## 2020-05-30 MED ORDER — ONDANSETRON HCL 4 MG/2ML IJ SOLN
INTRAMUSCULAR | Status: AC
  Filled 2020-05-30: qty 2

## 2020-05-30 MED ORDER — STERILE WATER FOR IRRIGATION IR SOLN
Status: DC | PRN
  Administered 2020-05-30: 1000 mL

## 2020-05-30 MED ORDER — LIDOCAINE-EPINEPHRINE 2 %-1:100000 IJ SOLN
INTRAMUSCULAR | Status: AC
  Filled 2020-05-30: qty 10.2

## 2020-05-30 MED ORDER — FENTANYL CITRATE (PF) 250 MCG/5ML IJ SOLN
INTRAMUSCULAR | Status: AC
  Filled 2020-05-30: qty 5

## 2020-05-30 MED ORDER — PROPOFOL 10 MG/ML IV BOLUS
INTRAVENOUS | Status: AC
  Filled 2020-05-30: qty 20

## 2020-05-30 MED ORDER — MIDAZOLAM HCL 2 MG/2ML IJ SOLN
INTRAMUSCULAR | Status: AC
  Filled 2020-05-30: qty 2

## 2020-05-30 MED ORDER — DEXAMETHASONE SODIUM PHOSPHATE 10 MG/ML IJ SOLN
INTRAMUSCULAR | Status: DC | PRN
  Administered 2020-05-30: 10 mg via INTRAVENOUS

## 2020-05-30 MED ORDER — ORAL CARE MOUTH RINSE: 15.0000 mL | Freq: Once | OROMUCOSAL | Status: AC

## 2020-05-30 MED ORDER — LACTATED RINGERS IV SOLN: INTRAVENOUS | Status: DC

## 2020-05-30 MED ORDER — CEFAZOLIN SODIUM-DEXTROSE 2-4 GM/100ML-% IV SOLN
2.0000 g | INTRAVENOUS | Status: AC
  Administered 2020-05-30: 2 g via INTRAVENOUS
  Filled 2020-05-30: qty 100

## 2020-05-30 MED ORDER — PROPOFOL 10 MG/ML IV BOLUS
INTRAVENOUS | Status: DC | PRN
  Administered 2020-05-30: 40 mg via INTRAVENOUS

## 2020-05-30 MED ORDER — FENTANYL CITRATE (PF) 250 MCG/5ML IJ SOLN
INTRAMUSCULAR | Status: DC | PRN
  Administered 2020-05-30 (×2): 25 ug via INTRAVENOUS
  Administered 2020-05-30 (×2): 50 ug via INTRAVENOUS

## 2020-05-30 MED ORDER — LIDOCAINE 2% (20 MG/ML) 5 ML SYRINGE
INTRAMUSCULAR | Status: DC | PRN
  Administered 2020-05-30: 100 mg via INTRAVENOUS

## 2020-05-30 MED ORDER — BUPIVACAINE-EPINEPHRINE (PF) 0.5% -1:200000 IJ SOLN
INTRAMUSCULAR | Status: AC
  Filled 2020-05-30: qty 3.6

## 2020-05-30 MED ORDER — DEXAMETHASONE SODIUM PHOSPHATE 10 MG/ML IJ SOLN
INTRAMUSCULAR | Status: AC
  Filled 2020-05-30: qty 1

## 2020-05-30 MED ORDER — MIDAZOLAM HCL 2 MG/2ML IJ SOLN
INTRAMUSCULAR | Status: DC | PRN
  Administered 2020-05-30: 2 mg via INTRAVENOUS

## 2020-05-30 MED ORDER — ONDANSETRON HCL 4 MG/2ML IJ SOLN
INTRAMUSCULAR | Status: DC | PRN
  Administered 2020-05-30: 4 mg via INTRAVENOUS

## 2020-05-30 MED ORDER — CHLORHEXIDINE GLUCONATE 0.12 % MT SOLN
15.0000 mL | Freq: Once | OROMUCOSAL | Status: AC
  Administered 2020-05-30: 15 mL via OROMUCOSAL
  Filled 2020-05-30: qty 15

## 2020-05-30 MED ORDER — LIDOCAINE-EPINEPHRINE 2 %-1:100000 IJ SOLN: INTRAMUSCULAR | Status: DC | PRN

## 2020-05-30 MED ORDER — PROPOFOL 500 MG/50ML IV EMUL
INTRAVENOUS | Status: DC | PRN
  Administered 2020-05-30: 100 ug/kg/min via INTRAVENOUS

## 2020-05-30 SURGICAL SUPPLY — 32 items
ALCOHOL 70% 16 OZ (MISCELLANEOUS) ×2 IMPLANT
BLADE SURG 15 STRL LF DISP TIS (BLADE) ×1 IMPLANT
BLADE SURG 15 STRL SS (BLADE) ×1
COVER SURGICAL LIGHT HANDLE (MISCELLANEOUS) ×2 IMPLANT
COVER WAND RF STERILE (DRAPES) ×2 IMPLANT
GAUZE 4X4 16PLY RFD (DISPOSABLE) ×2 IMPLANT
GAUZE PACKING FOLDED 2  STR (GAUZE/BANDAGES/DRESSINGS) ×1
GAUZE PACKING FOLDED 2 STR (GAUZE/BANDAGES/DRESSINGS) ×1 IMPLANT
GLOVE BIO SURGEON STRL SZ 6.5 (GLOVE) ×2 IMPLANT
GLOVE SURG SS PI 6.0 STRL IVOR (GLOVE) ×2 IMPLANT
GOWN STRL REUS W/ TWL LRG LVL3 (GOWN DISPOSABLE) ×2 IMPLANT
GOWN STRL REUS W/TWL LRG LVL3 (GOWN DISPOSABLE) ×2
KIT BASIN OR (CUSTOM PROCEDURE TRAY) ×2 IMPLANT
KIT TURNOVER KIT B (KITS) ×2 IMPLANT
MANIFOLD NEPTUNE II (INSTRUMENTS) ×2 IMPLANT
NS IRRIG 1000ML POUR BTL (IV SOLUTION) ×2 IMPLANT
PACK EENT II TURBAN DRAPE (CUSTOM PROCEDURE TRAY) ×2 IMPLANT
PAD ARMBOARD 7.5X6 YLW CONV (MISCELLANEOUS) ×2 IMPLANT
SPONGE SURGIFOAM ABS GEL 100 (HEMOSTASIS) IMPLANT
SPONGE SURGIFOAM ABS GEL 12-7 (HEMOSTASIS) IMPLANT
SPONGE SURGIFOAM ABS GEL SZ50 (HEMOSTASIS) IMPLANT
SUCTION FRAZIER HANDLE 10FR (MISCELLANEOUS) ×1
SUCTION TUBE FRAZIER 10FR DISP (MISCELLANEOUS) ×1 IMPLANT
SUT CHROMIC 3 0 PS 2 (SUTURE) ×4 IMPLANT
SUT CHROMIC 4 0 P 3 18 (SUTURE) IMPLANT
SYR 50ML SLIP (SYRINGE) ×2 IMPLANT
SYR BULB IRRIG 60ML STRL (SYRINGE) ×2 IMPLANT
TOWEL GREEN STERILE FF (TOWEL DISPOSABLE) ×2 IMPLANT
TUBE CONNECTING 12X1/4 (SUCTIONS) ×2 IMPLANT
WATER STERILE IRR 1000ML POUR (IV SOLUTION) ×2 IMPLANT
WATER TABLETS ICX (MISCELLANEOUS) ×2 IMPLANT
YANKAUER SUCT BULB TIP NO VENT (SUCTIONS) ×2 IMPLANT

## 2020-05-30 NOTE — Interval H&P Note (Signed)
History and Physical Interval Note:  05/30/2020 12:35 PM  Mario Peters  has presented today for surgery, with the diagnosis of dental caries.  The various methods of treatment have been discussed with the patient and family. After consideration of risks, benefits and other options for treatment, the patient has consented to  Procedure(s): MULTIPLE EXTRACTION WITH ALVEOLOPLASTY (N/A) as a surgical intervention.  The patient's history has been reviewed, patient examined, no change in status, stable for surgery.  I have reviewed the patient's chart and labs.  Questions were answered to the patient's satisfaction.     [name]

## 2020-05-30 NOTE — Anesthesia Preprocedure Evaluation (Addendum)
Anesthesia Evaluation  Patient identified by MRN, date of birth, ID band Patient awake    Reviewed: Allergy & Precautions, NPO status , Patient's Chart, lab work & pertinent test results  History of Anesthesia Complications Negative for: history of anesthetic complications  Airway Mallampati: Trach      Comment: Laryngeal ca Dental   Pulmonary neg pulmonary ROS, Patient abstained from smoking., former smoker,    Pulmonary exam normal        Cardiovascular negative cardio ROS Normal cardiovascular exam     Neuro/Psych Seizures -,     GI/Hepatic negative GI ROS, Neg liver ROS,   Endo/Other  negative endocrine ROS  Renal/GU negative Renal ROS     Musculoskeletal negative musculoskeletal ROS (+)   Abdominal   Peds  Hematology negative hematology ROS (+)   Anesthesia Other Findings   Reproductive/Obstetrics                           Anesthesia Physical Anesthesia Plan  ASA: III  Anesthesia Plan: General   Post-op Pain Management:    Induction: Intravenous  PONV Risk Score and Plan: 2 and Ondansetron and Dexamethasone  Airway Management Planned: Tracheostomy  Additional Equipment:   Intra-op Plan:   Post-operative Plan:   Informed Consent: I have reviewed the patients History and Physical, chart, labs and discussed the procedure including the risks, benefits and alternatives for the proposed anesthesia with the patient or authorized representative who has indicated his/her understanding and acceptance.     Dental advisory given  Plan Discussed with: Anesthesiologist and CRNA  Anesthesia Plan Comments:        Anesthesia Quick Evaluation

## 2020-05-30 NOTE — Transfer of Care (Signed)
Immediate Anesthesia Transfer of Care Note  Patient: Mario Peters  Procedure(s) Performed: MULTIPLE EXTRACTION WITH ALVEOLOPLASTY (N/A Mouth)  Patient Location: PACU  Anesthesia Type:General  Level of Consciousness: drowsy and patient cooperative  Airway & Oxygen Therapy: Patient Spontanous Breathing  Post-op Assessment: Report given to RN and Post -op Vital signs reviewed and stable  Post vital signs: Reviewed and stable  Last Vitals:  Vitals Value Taken Time  BP 101/63 05/30/20 1412  Temp    Pulse 84 05/30/20 1414  Resp 16 05/30/20 1414  SpO2 98 % 05/30/20 1414  Vitals shown include unvalidated device data.  Last Pain:  Vitals:   05/30/20 1100  TempSrc:   PainSc: 0-No pain         Complications: No complications documented.

## 2020-05-30 NOTE — Telephone Encounter (Signed)
Scheduled per 05/10 staff message, spoke with [facility]. Patient will be notified of upcoming appointments.

## 2020-05-30 NOTE — Op Note (Signed)
Department of Dental Medicine    OPERATIVE REPORT  DATE OF SURGERY:  05/30/2020  PATIENT NAME:   Mario Peters DATE OF BIRTH:   08-May-1965 MEDICAL RECORD NUMBER: [id number]  SURGEON:  Addam Goeller B. Benson Norway, D.M.D.  ASSISTANT:  Molli Posey, DAII  PREOPERATIVE DIAGNOSES:  Dental caries, periodontal disease Patient Active Problem List   Diagnosis Date Noted  . Tracheostomy care ([place]) 05/15/2020  . 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 Laryngeal cancer ([place])   . Protein-calorie malnutrition, severe 04/20/2020  . Hypophosphatemia 04/19/2020  . Hypomagnesemia 04/19/2020  . AKI (acute kidney injury) ([place]) 04/18/2020  . Dehydration 04/18/2020  . CAP (community acquired pneumonia) 04/18/2020  . 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 Hyponatremia 04/18/2020  . Weight loss, unintentional 04/18/2020  . Hypokalemia 04/18/2020  . Malnutrition ([place]) 04/18/2020  . Tobacco abuse 04/18/2020  . Sepsis ([place]) 04/17/2020    POSTOPERATIVE DIAGNOSES: Same  PROCEDURES PERFORMED: 1. Extractions of teeth numbers 3, 4, 5, 6, 13, 15, 16, 20, 21, 22, 24, 25, 27, 28, 31 and 32  ANESTHESIA: IV sedation  MEDICATIONS: 1. Ancef 2 g IV prior to invasive dental procedures. 2. Local anesthesia with a total utilization of 3 cartridges of 34 mg of lidocaine with 0.018 mg of epinephrine/ea.  SPECIMENS: 16 teeth that were extracted and discarded.  DRAINS/CULTURES: None  COMPLICATIONS: None  ESTIMATED BLOOD LOSS: 5 mL  INTRAVENOUS FLUIDS: 10 mL of Lactated ringers solution.  INDICATIONS: The patient was recently diagnosed with laryngeal cancer.  A medically necessary dental consult was then requested to evaluate the patient for any dental/orofacial infection and their overall oral health.  The patient was examined and subsequently treatment planned for multiple extractions of grossly decayed/infected teeth.  This treatment plan was made to decrease the perioperative and postoperative risks and complications  associated with dental/orofacial infection from affecting the patient's systemic health.  OPERATIVE FINDINGS:  The patient was examined in operating room number 9.  The indicated teeth were identified and verified for extraction. The patient was noted be affected by severe dental decay, chronic periodontitis and chronic apical periodontitis.  DESCRIPTION OF PROCEDURE: The patient was identified in the holding area and brought to the main operating room number 9 by the anesthesia team. The patient was then placed in the supine position on the operating table.  IV sedation was then induced per the anesthesia team. The patient was then prepped and draped in the usual sterile fashion for dental medicine procedures.  A timeout was performed. The patient was identified and procedures were verified.  A 4x4 gauze was placed as a throat pack placed at the back of the oral cavity. The oral cavity was then thoroughly examined with the findings noted above. The patient was then ready for the dental medicine procedure as follows:  ROUTINE EXTRACTIONS: Local anesthesia was administered sequentially with a total utilization of 3 cartridges each containing 34 mg of lidocaine with 0.018 mg of epinephrine/ea.  Location of anesthesia included upper right and upper left infiltration + palatal, and lower right and lower left infiltration, lingual and right side long buccal nerve block.  The maxillary left and right quadrants were first approached. The teeth were then subluxated with a series of straight elevators. Tooth numbers 3, 4, 5, 6, 13, 15 and 16 were then removed with a 150 forceps and Ronguers without complications. A 15-blade incision was made distal to #13 across alveolar ridge to locate potential retained root tip #14, however only sound bone was observed and no retained  root tip was present. The surgical sites were then irrigated with copious amounts of sterile saline. The tissues were approximated and trimmed  appropriately.  The surgical sites were closed using 3-0 chromic gut sutures as follows: 1 simple interrupted suture used to close incision site on the upper left. Good hemostasis achieved.  The mandibular left and right quadrants were then approached. The teeth were subluxated with a series of straight elevators. Tooth numbers 20, 21, 22, 24, 25, 27, 28, 31 and 32 were then removed utilizing a #23, Ronguers and 151 forceps. The tissues were approximated and trimmed appropriately. The surgical sites were then irrigated with copious amounts of sterile saline.  Good hemostasis achieved.  END OF PROCEDURE: Thorough oral irrigation with sterile saline was performed. The patient was examined for complications, and seeing none, the dental medicine procedure was deemed to be complete. The 4x4 gauze used to protect the throat was removed at this time. An oral airway was then placed at the request of the anesthesia team. A series of 4 x 4 gauze were placed in the mouth to aid hemostasis as needed. The patient was then handed over to the anesthesia team for final disposition. After an appropriate amount of time, the patient was taken to the postanesthsia care unit in stable condition. All counts were correct for the dental medicine procedure.   Wyanet Benson Norway, D.M.D.

## 2020-05-30 NOTE — Discharge Instructions (Signed)
[facility] Department of Dental Medicine Nehemie Casserly B. Becci Batty, D.M.D. Phone: [phone] Fax: [phone]    MOUTH CARE AFTER SURGERY   FACTS: Ice used in ice bag helps keep the swelling down, and can help lessen the pain. It is easier to treat pain BEFORE it happens. Spitting disturbs the clot and may cause bleeding to start again, or to get worse. Smoking delays healing and can cause complications. Sharing prescriptions can be dangerous.  Do not take medications not recently prescribed for you. Antibiotics may stop birth control pills from working.  Use other means of birth control while on antibiotics. Warm salt water rinses after the first 24 hours will help lessen the swelling:  Use 1/2 teaspoonful of table salt per oz.of water.  DO NOT: Do not spit.   Do not drink through a straw. Strongly advised not to smoke, dip snuff or chew tobacco at least for 3 days. Do not eat sharp or crunchy foods.  Avoid the area of surgery when chewing. Do not stop your antibiotics before your instructions say to do so. Do not eat hot foods until bleeding has stopped.  If you need to, let your food cool down to room temperature.  EXPECT: Some swelling, especially first 2-3 days. Soreness or discomfort in varying degrees.  Follow your dentist's instructions about how to handle pain before it starts. Pinkish saliva or light blood in saliva, or on your pillow in the morning.  This can last around 24 hours. Bruising inside or outside the mouth.  This may not show up until 2-3 days after surgery.  Don't worry, it will go away in time. Pieces of "bone" may work themselves loose.  It's OK.  If they bother you, let us know.    WHAT TO DO IMMEDIATELY AFTER SURGERY: Bite on gauze with steady pressure for 30-45 minutes at a time.  Switch out the gauze after 30-45 minutes for clean gauze, and continue this for 1-2 hours or until bleeding subsides. Do not chew on the gauze. Do not lie down flat.  Raise  your head support especially for the first 24 hours. Apply ice to your face on the side of the surgery.  You may apply it 20 minutes on and a few minutes off.  Ice for 8-12 hours.  You may use ice up to 24 hours. Before the numbness wears off, take a pain pill as instructed. Prescription pain medication is not always required.  SWELLING: Expect swelling for the first couple of days.  It should get better after that. If swelling increases 3 days or so after surgery, let us know as soon as possible.  FEVER: Take Tylenol every 4 hours if needed to lower your temperature, especially if it is at 100F or higher. Drink lots of fluids. If the fever does not go away, let us know.  BREATHING TROUBLE: Any unusual difficulty breathing means you have to have someone bring you to the emergency room ASAP.  BLEEDING: Light oozing is expected for 24 hours or so. Prop head up with pillows. Do not spit. Do not confuse bright red fresh flowing blood with lots of saliva colored with a little bit of blood. If you notice some bleeding, place gauze or a tea bag where it is bleeding and apply CONSTANT pressure by biting down for 1 hour.  Avoid talking during this time.  Do not remove the gauze or tea bag during this hour to "check" the bleeding. If you notice bright RED bleeding FLOWING out   of particular area, and filling the floor of your mouth, put a wad of gauze on that area, bite down firmly and constantly.  Call us immediately.  If we're closed, have someone bring you to the emergency room.  ORAL HYGIENE: Brush your teeth as usual after meals and before bedtime. Use a soft toothbrush around the area of surgery. DO NOT AVOID BRUSHING.  Otherwise bacteria(germs) will grow and may delay healing or encourage infection. Since you cannot spit, just gently rinse and let the water flow out of your mouth. DO NOT SWISH HARD.  EATING: Cool liquids are a good point to start.  Increase to soft foods as  tolerated.   PRESCRIPTIONS: Follow the directions for your prescriptions exactly as written. If your doctor gave you a narcotic pain medication, do not drive, operate machinery or drink alcohol when on that medication.   QUESTIONS? Call our office during office hours [phone] or call the Emergency Room at [phone].  

## 2020-05-30 NOTE — Progress Notes (Signed)
Oncology Nurse Navigator Documentation  Dr. Chryl Heck requested an audiology exam be completed before Mario Peters starts chemotherapy treatment. I scheduled him for an appointment at Dr. Janeice Robinson office on 06/07/20 at 11:40. I called and spoke to his sister [name] and she is now aware and added the appointment date and time to her calendar. She knows to call me if she has any other questions or concerns.   Harlow Asa RN, BSN, OCN Head & Neck Oncology Nurse [facility] at [facility] Phone # [phone]  Fax # [phone]

## 2020-05-31 ENCOUNTER — Ambulatory Visit (HOSPITAL_COMMUNITY)
Admission: RE | Admit: 2020-05-31 | Discharge: 2020-05-31 | Disposition: A | Discharge status: Home / Self Care | Payer: Medicaid Other | Source: Ambulatory Visit | Attending: Acute Care | Admitting: Acute Care

## 2020-05-31 ENCOUNTER — Other Ambulatory Visit: Payer: Self-pay

## 2020-05-31 ENCOUNTER — Encounter (HOSPITAL_COMMUNITY): Payer: Self-pay | Admitting: Dentistry

## 2020-05-31 ENCOUNTER — Other Ambulatory Visit: Payer: Medicaid Other

## 2020-05-31 DIAGNOSIS — Z930 Tracheostomy status: Secondary | ICD-10-CM | POA: Insufficient documentation

## 2020-05-31 DIAGNOSIS — C760 Malignant neoplasm of head, face and neck: Secondary | ICD-10-CM | POA: Diagnosis not present

## 2020-05-31 DIAGNOSIS — K029 Dental caries, unspecified: Secondary | ICD-10-CM

## 2020-05-31 DIAGNOSIS — Z430 Encounter for attention to tracheostomy: Secondary | ICD-10-CM | POA: Insufficient documentation

## 2020-05-31 DIAGNOSIS — Z87891 Personal history of nicotine dependence: Secondary | ICD-10-CM | POA: Diagnosis not present

## 2020-05-31 DIAGNOSIS — K045 Chronic apical periodontitis: Secondary | ICD-10-CM

## 2020-05-31 DIAGNOSIS — E46 Unspecified protein-calorie malnutrition: Secondary | ICD-10-CM | POA: Diagnosis not present

## 2020-05-31 DIAGNOSIS — K0530 Chronic periodontitis, unspecified: Secondary | ICD-10-CM

## 2020-05-31 DIAGNOSIS — K083 Retained dental root: Secondary | ICD-10-CM

## 2020-05-31 DIAGNOSIS — R1310 Dysphagia, unspecified: Secondary | ICD-10-CM | POA: Diagnosis not present

## 2020-05-31 NOTE — Anesthesia Postprocedure Evaluation (Signed)
Anesthesia Post Note  Patient: Mario Peters  Procedure(s) Performed: MULTIPLE EXTRACTION WITH ALVEOLOPLASTY (N/A Mouth)     Patient location during evaluation: PACU Anesthesia Type: General Level of consciousness: awake and alert Pain management: pain level controlled Vital Signs Assessment: post-procedure vital signs reviewed and stable Respiratory status: spontaneous breathing, nonlabored ventilation, respiratory function stable and patient connected to nasal cannula oxygen Cardiovascular status: blood pressure returned to baseline and stable Postop Assessment: no apparent nausea or vomiting Anesthetic complications: no   No complications documented.  Last Vitals:  Vitals:   05/30/20 1445 05/30/20 1500  BP: 128/75 127/77  Pulse: 70 74  Resp: 14 16  Temp:  36.7 C  SpO2: 93% 93%    Last Pain:  Vitals:   05/30/20 1445  TempSrc:   PainSc: [place]

## 2020-05-31 NOTE — Consult Note (Signed)
Reason for visit/cc:   Trach care   HPI  This is a 55 year old male w/ h/o laryngeal mass first identified back on 3/31 in setting of wt loss and dysphagia. He underwent trach on 4/4 along w/ tissue bx confirming cancer. He has yet to start radiotherapy or chemotherapy and his most recent PET scan shows intense metabolic activity "associated with the bilateral glottis and supraglottic tissue extending superiorly on the RIGHT to the level of the vallecula. Asymmetric hypermetabolic activity associated with the leftpalatine tonsil region. Bilateral hypermetabolic level II lymph nodes beneath the sternocleidomastoid muscles consistent with metastatic adenopathy. Metabolic activity associated with the tracheostomy tube track is favored benign. Small subcarinal lymph node with mild metabolic activity is favored benign. Three small pulmonary nodules in the RIGHT lung are indeterminate".  He presents today to get enrolled in trach clinic.   Past Medical History:  Diagnosis Date  . Cancer ([place])    Seizures ([place])      Past Surgical History:        IR GASTROSTOMY TUBE MOD SED  04/24/2020   MULTIPLE EXTRACTIONS WITH ALVEOLOPLASTY N/A 05/30/2020   TRACHEOSTOMY TUBE PLACEMENT N/A 04/22/2020     No Known Allergies    Family History  Problem Relation Age of Onset  . Hypertension Other    Social History   Socioeconomic History  . Marital status: Married    Spouse name:        Tobacco Use  . Smoking status: Former Smoker    Packs/day: 2.00  . Smokeless tobacco: Never Used   Substance and Sexual Activity  . 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 Drug use: Not Currently    Types: Marijuana   Social History Narrative  . Not on file   Review of Systems  Constitutional: Positive for weight loss.  HENT: Negative.   Eyes: Negative.   Respiratory: Positive for cough.   Cardiovascular: Negative.   Gastrointestinal: Negative.   Genitourinary: Negative.    Musculoskeletal: Negative.   Skin: Negative.   Neurological: Negative.   Endo/Heme/Allergies: Negative.   Psychiatric/Behavioral: Negative.     Exam   General this is a 55 year old male. He is ambulatory and appears in no distress however he does appear frail and slightly malnurished to me  HENT NCAT the 6 cuffless trach is unremarkable. His phonation quality is poor and raspy at best. He does not tolerate capping for any extended period of time. Does have sig rush of air when he removes his PMV Pulm clear no accessory use  Card rrr no MRG abd soft not tender Ext warm and dry  Neuro intact   Impression/plan  Head and neck cancer Supraglottic and subglottic obstruction  Trach dependence  Protein calorie malnutrition  Dysphagia   Discussion  He is stable from trach stand-point and has yet to start therapy. At this point he has significant dyspnea and air trapping when the trach is occluded for any extended period of time which I think is likely due to his subglottic obstruction. I have instructed him to only use his PMV for short periods of time for communication and then remove. I am hopeful w/ radiation and time this may improve but that will depend on how he responds to his cancer treatment.  Another issue is that he continues to eat even though he has proven dysphagia. His sister who was with him reported to me that he is still eating fairly  frequently and demonstrates frequent episodes of choking. I discussed with him the importance of dysphagia precautions if his goal is to go forward with cancer therapy. He appears malnurished to me and I think another hospitalization for an aspiration PNA would make the likelihood of him tolerating chemo in the future pretty low. I do not think he has much in the way of reserves.   Plan Continue routine trach care ROV 10 weeks for trach change Instructed to follow dysphagia precautions if his desire is to be aggressive about his cancer  treatment.   My time 41 minutes Erick Colace ACNP-BC [place] Pager # [phone] OR # [phone] if no answer

## 2020-05-31 NOTE — Progress Notes (Addendum)
Tracheostomy Procedure Note  Mario Peters [id number] Dec 17, 1965  Pre Procedure Tracheostomy Information  Trach Brand: Shiley [id number] Size: 6.0 Style: Uncuffed Secured by: Velcro   Procedure: Trach change and Trach Cleaning    Post Procedure Tracheostomy Information  Trach BrandCristy Hilts [id number] Size: 6.0 Style: Uncuffed Secured by: Velcro   Post Procedure Evaluation:  ETCO2 positive color change from yellow to purple : Yes.   Vital signs:VSS Patients current condition: stable Complications: No apparent complications Trach site exam: clean and dry Wound care done: 4 x 4 gauze drain Patient did tolerate procedure well.   Education: none  Prescription needs: None    Additional needs: New PMV given to patient today

## 2020-06-03 ENCOUNTER — Other Ambulatory Visit: Payer: Self-pay

## 2020-06-03 ENCOUNTER — Ambulatory Visit
Admission: RE | Admit: 2020-06-03 | Discharge: 2020-06-03 | Disposition: A | Discharge status: Home / Self Care | Payer: Medicaid Other | Source: Ambulatory Visit | Attending: Radiation Oncology | Admitting: Radiation Oncology

## 2020-06-03 ENCOUNTER — Telehealth: Payer: Self-pay | Admitting: Dentistry

## 2020-06-03 ENCOUNTER — Ambulatory Visit: Admission: RE | Admit: 2020-06-03 | Payer: Medicaid Other | Source: Ambulatory Visit | Admitting: Radiation Oncology

## 2020-06-03 VITALS — BP 133/77 | HR 66 | Temp 97.1°F | Resp 20 | Ht 67.0 in | Wt 118.4 lb

## 2020-06-03 DIAGNOSIS — C320 Malignant neoplasm of glottis: Secondary | ICD-10-CM | POA: Insufficient documentation

## 2020-06-03 DIAGNOSIS — Z510 Encounter for antineoplastic radiation therapy: Secondary | ICD-10-CM | POA: Diagnosis present

## 2020-06-03 DIAGNOSIS — C329 Malignant neoplasm of larynx, unspecified: Secondary | ICD-10-CM

## 2020-06-03 DIAGNOSIS — C760 Malignant neoplasm of head, face and neck: Secondary | ICD-10-CM

## 2020-06-03 MED ORDER — SODIUM CHLORIDE 0.9% FLUSH
10.0000 mL | Freq: Once | INTRAVENOUS | Status: AC
Start: 2020-06-03 — End: 2020-06-03
  Administered 2020-06-03: 10 mL via INTRAVENOUS

## 2020-06-03 NOTE — Telephone Encounter (Signed)
06/03/20  @   0935  Attempted to contact the patient's sister, Kirklin Mcduffee, to see how Damian was doing after his dental surgery last Thursday and to schedule a postoperative visit in our clinic prior to his radiation start date.  No answer, voicemail box full.   Maybeury Benson Norway, D.M.D.

## 2020-06-03 NOTE — Progress Notes (Signed)
Has armband been applied?  No.  Does patient have an allergy to IV contrast dye?: No.   Has patient ever received premedication for IV contrast dye?: No.   Does patient take metformin?: No.  Date of lab work: May 30, 2020 BUN: 8 CR: 0.30 EGFR: 71 (per lab technician that was able to calculate value manually)   IV site: forearm right, condition patent and no redness  Has IV site been added to flowsheet?  Yes.    Vitals:   06/03/20 0839  BP: 133/77  Pulse: 66  Resp: 20  Temp: (!) 97.1 F (36.2 C)  SpO2: 100%     

## 2020-06-05 ENCOUNTER — Ambulatory Visit (HOSPITAL_COMMUNITY): Payer: Medicaid Other | Admitting: Dentistry

## 2020-06-05 ENCOUNTER — Inpatient Hospital Stay: Payer: Medicaid Other

## 2020-06-05 ENCOUNTER — Telehealth: Payer: Self-pay | Admitting: *Deleted

## 2020-06-05 ENCOUNTER — Other Ambulatory Visit: Payer: Self-pay

## 2020-06-05 ENCOUNTER — Other Ambulatory Visit: Payer: Self-pay | Admitting: Student

## 2020-06-05 DIAGNOSIS — C320 Malignant neoplasm of glottis: Secondary | ICD-10-CM

## 2020-06-05 MED ORDER — DEXAMETHASONE 4 MG PO TABS: 8.0000 mg | ORAL_TABLET | Freq: Every day | ORAL | 1 refills | Status: AC

## 2020-06-05 MED ORDER — ONDANSETRON HCL 8 MG PO TABS: 8.0000 mg | ORAL_TABLET | Freq: Two times a day (BID) | ORAL | 1 refills | Status: DC | PRN

## 2020-06-05 MED ORDER — PROCHLORPERAZINE MALEATE 10 MG PO TABS: 10.0000 mg | ORAL_TABLET | Freq: Four times a day (QID) | ORAL | 1 refills | Status: AC | PRN

## 2020-06-05 NOTE — Telephone Encounter (Signed)
Received call from pt's sister, [name] stating that pt is not feeling well & was in bed all day yesterday & doesn't think he can make it for class today.  She reports that neck is swollen & he is nauseated  & color is pale. Informed Dr Rob Hickman nurse, Junie Panning.

## 2020-06-06 ENCOUNTER — Encounter (HOSPITAL_COMMUNITY): Payer: Self-pay

## 2020-06-06 ENCOUNTER — Ambulatory Visit (HOSPITAL_COMMUNITY)
Admission: RE | Admit: 2020-06-06 | Discharge: 2020-06-06 | Disposition: A | Discharge status: Home / Self Care | Payer: Medicaid Other | Source: Ambulatory Visit | Attending: Hematology and Oncology | Admitting: Hematology and Oncology

## 2020-06-06 ENCOUNTER — Other Ambulatory Visit (HOSPITAL_COMMUNITY): Payer: Self-pay

## 2020-06-06 ENCOUNTER — Other Ambulatory Visit: Payer: Self-pay

## 2020-06-06 ENCOUNTER — Ambulatory Visit (HOSPITAL_COMMUNITY)
Admit: 2020-06-06 | Discharge: 2020-06-06 | Disposition: A | Discharge status: Home / Self Care | Payer: Medicaid Other | Attending: Hematology and Oncology | Admitting: Hematology and Oncology

## 2020-06-06 DIAGNOSIS — C329 Malignant neoplasm of larynx, unspecified: Secondary | ICD-10-CM | POA: Insufficient documentation

## 2020-06-06 DIAGNOSIS — Z538 Procedure and treatment not carried out for other reasons: Secondary | ICD-10-CM | POA: Diagnosis not present

## 2020-06-06 MED ORDER — SODIUM CHLORIDE 0.9 % IV SOLN: INTRAVENOUS | Status: DC

## 2020-06-06 NOTE — Progress Notes (Signed)
Pharmacist Chemotherapy Monitoring - Initial Assessment    Anticipated start date: 06/13/20   Regimen:  . Are orders appropriate based on the patient's diagnosis, regimen, and cycle? Yes . Does the plan date match the patient's scheduled date? Yes . Is the sequencing of drugs appropriate? Yes . Are the premedications appropriate for the patient's regimen? Yes . Prior Authorization for treatment is: Approved o If applicable, is the correct biosimilar selected based on the patient's insurance? not applicable  Organ Function and Labs: Marland Kitchen Are dose adjustments needed based on the patient's renal function, hepatic function, or hematologic function? Yes . Are appropriate labs ordered prior to the start of patient's treatment? Yes . Other organ system assessment, if indicated:  Marland Kitchen The following baseline labs, if indicated, have been ordered: cisplatin: K, Mg  Dose Assessment: . Are the drug doses appropriate? Yes . Are the following correct: o Drug concentrations Yes o IV fluid compatible with drug Yes o Administration routes Yes o Timing of therapy Yes . If applicable, does the patient have documented access for treatment and/or plans for port-a-cath placement? yes . If applicable, have lifetime cumulative doses been properly documented and assessed? no Lifetime Dose Tracking  No doses have been documented on this patient for the following tracked chemicals: Doxorubicin, Epirubicin, Idarubicin, Daunorubicin, Mitoxantrone, Bleomycin, Oxaliplatin, Carboplatin, Liposomal Doxorubicin  o   Toxicity Monitoring/Prevention: . The patient has the following take home antiemetics prescribed: Ondansetron, Prochlorperazine and Dexamethasone . The patient has the following take home medications prescribed: N/A . Medication allergies and previous infusion related reactions, if applicable, have been reviewed and addressed. No . The patient's current medication list has been assessed for drug-drug  interactions with their chemotherapy regimen. no significant drug-drug interactions were identified on review.  Order Review: . Are the treatment plan orders signed? Yes . Is the patient scheduled to see a provider prior to their treatment? Yes  I verify that I have reviewed each item in the above checklist and answered each question accordingly.  Larene Beach, [place], 06/06/2020  11:01 AM

## 2020-06-06 NOTE — Progress Notes (Incomplete)
Patient arrived to Short Stay for [facility] placement with swelling and pain on right jaw and neck area.  Mario Han, PA with IR notified.  Patient procedure cancelled.  Patient advised to see dentist asap.  Patient and [name] verbalized understanding.  Port placement to [employer]

## 2020-06-06 NOTE — Progress Notes (Addendum)
Patient presented to [facility] IR with his sister for image guised port-a- catheter placement today.   Sister states that the patient has not been acting himself x 2-3 days, very lethargic and not as talkative as usual.   She also reports that the patient has been complaining about right neck pain x 4 days.   Patient is s/p extraction of multiple teeth due to decay/infection last Thursday, had a follow up visit scheduled with dentistry yesterday which he could not make to due to fatigue.   Vitals reviewed, pt afebrile. Upon evaluation, patient appears lethargic.  TTP on right neck, from right mastoid process to right clavicle.  Skin is warm and mildly erythematous.  Discussed with Dr. Dwaine Gale, the port-a-catheter placement to be rescheduled due to  concern for active infection.  Patient to follow up with dentistry prior to port-a-catheter placement.   Patient, patient's sister and Short Stay RN notified the plan, all aggreeable.  Ordering physician, Dr. Chryl Heck notified by IR team.    Armando Gang Elyssa Pendelton PA-C 06/06/2020 10:58 AM

## 2020-06-07 ENCOUNTER — Other Ambulatory Visit: Payer: Self-pay

## 2020-06-07 ENCOUNTER — Ambulatory Visit (INDEPENDENT_AMBULATORY_CARE_PROVIDER_SITE_OTHER): Payer: Medicaid Other | Admitting: Dentistry

## 2020-06-07 ENCOUNTER — Encounter: Payer: Self-pay | Admitting: General Practice

## 2020-06-07 ENCOUNTER — Other Ambulatory Visit (HOSPITAL_COMMUNITY): Payer: Self-pay

## 2020-06-07 DIAGNOSIS — C320 Malignant neoplasm of glottis: Secondary | ICD-10-CM

## 2020-06-07 DIAGNOSIS — K08199 Complete loss of teeth due to other specified cause, unspecified class: Secondary | ICD-10-CM

## 2020-06-07 MED ORDER — HYDROCODONE-ACETAMINOPHEN 5-325 MG PO TABS
1.0000 | ORAL_TABLET | Freq: Four times a day (QID) | ORAL | 0 refills | Status: AC | PRN
  Filled 2020-06-07: qty 12, 3d supply, fill #0

## 2020-06-07 MED ORDER — AMOXICILLIN 500 MG PO CAPS
500.0000 mg | ORAL_CAPSULE | Freq: Three times a day (TID) | ORAL | 0 refills | Status: AC
  Filled 2020-06-07: qty 21, 7d supply, fill #0

## 2020-06-07 MED ORDER — CHLORHEXIDINE GLUCONATE 0.12 % MT SOLN
15.0000 mL | Freq: Two times a day (BID) | OROMUCOSAL | 0 refills | Status: AC
  Filled 2020-06-07: qty 473, 16d supply, fill #0

## 2020-06-07 NOTE — Progress Notes (Signed)
[facility] CSW Progress Notes  Referral received, have previously completed psychosocial assessment with patient. Patient is unable to speak for himself, reviewed previous data with sister. Assessment values are unchanged since that time.  Spoke w sister as patient is now unable to speak for himself due to new trach placement.  He is now having increased swelling and pain.  Sister reports that he is now getting supplies from [facility] as needed.  They need a portable suction machine in order to transport him to/from appointments.  [employer] is aware of these needs, sister has called them to ask for help w the portable suction machine.  Lives with one of his sisters during treatment, he can take care of all his ADLs in the home, "does not feel comfortable with taking a shower yet."  Two sisters are heavily involved in his care.   Patient and family are focused on getting through treatment, with goal of being able to be off "the trach and feeding tube by his birthday so he can eat and enjoy himself."    Will see patient in Multidisciplinary Clinic when scheduled.  Edwyna Shell, LCSW Clinical Social Worker Phone:  [phone]

## 2020-06-07 NOTE — Progress Notes (Signed)
Department of Dental Medicine     POSTOPERATIVE VISIT  Service Date:   06/07/2020  Patient Name:   Mario Peters Date of Birth:   [birth year] Medical Record Number: [id number]  TODAY'S VISIT    Assessment:   . The patient is healing well and consistent with dental procedures performed.  . There is an area of exposed bone in the upper right quadrant that is causing him pain, but there are no signs of acute infection or purulence.  He is likely just healing slower than normal.  Recommendations:  There are no current dental concerns.  The patient is okay to start radiation as planned. Plan:  . Follow-up after completion of radiation therapy. Marland Kitchen Rx:  Chlorhexidine gluconate 0.12% mouthrinse (continue to use twice a day), Amoxicillin 500 mg 3 times a day for 7 days, NORCO 5-325 mg to take as needed for pain management for up to 3 days.  Discussed in detail all treatment options and recommendations with the patient and they are agreeable to the plan.  PROGRESS NOTE:   COVID-19 SCREENING:  The patient denies symptoms concerning for COVID-19 infection including fever, chills, cough, or newly developed shortness of breath.   HISTORY OF PRESENT ILLNESS . Mario Peters presents today for a postoperative visit s/p multiple extractions in the operating room on 5/12.   . Medical and dental history reviewed with the patient.   CHIEF COMPLAINT:   . Patient points to an area in the upper right quadrant that is causing pain.  Otherwise he states that he had no issues or complications following his dental surgery.   Patient Active Problem List   Diagnosis Date Noted  . Caries   . Chronic periodontitis   . Retained dental root   . Chronic apical periodontitis   . 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 Tracheostomy dependence ([place])   . Dysphagia   . Tracheostomy care ([place]) 05/15/2020  . 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 Laryngeal cancer ([place])   . Protein-calorie malnutrition,  severe 04/20/2020  . Hypophosphatemia 04/19/2020  . Hypomagnesemia 04/19/2020  . AKI (acute kidney injury) ([place]) 04/18/2020  . Dehydration 04/18/2020  . CAP (community acquired pneumonia) 04/18/2020  . 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 Hyponatremia 04/18/2020  . Weight loss, unintentional 04/18/2020  . Hypokalemia 04/18/2020  . Malnutrition ([place]) 04/18/2020  . Tobacco abuse 04/18/2020  . Sepsis ([place]) 04/17/2020   Past Medical History:  Diagnosis Date  . Cancer ([place])   . Seizures ([place])    Past Surgical History:  Procedure Laterality Date  . DIRECT LARYNGOSCOPY N/A 04/22/2020   Procedure: DIRECT LARYNGOSCOPY WITH BIOPSY;  Surgeon: [name], MD;  Location: [facility];  Service: ENT;  Laterality: N/A;  . ESOPHAGOSCOPY  04/22/2020   Procedure: ESOPHAGOSCOPY;  Surgeon: [name], MD;  Location: [facility];  Service: ENT;;  . IR GASTROSTOMY TUBE MOD SED  04/24/2020  . MULTIPLE EXTRACTIONS WITH ALVEOLOPLASTY N/A 05/30/2020   Procedure: MULTIPLE EXTRACTION WITH ALVEOLOPLASTY;  Surgeon: [name], DMD;  Location: [facility];  Service: Dentistry;  Laterality: N/A;  . TRACHEOSTOMY TUBE PLACEMENT N/A 04/22/2020   Procedure: TRACHEOSTOMY;  Surgeon: [name], MD;  Location: [facility];  Service: ENT;  Laterality: N/A;   Current Outpatient Medications  Medication Sig Dispense Refill  . acetaminophen (TYLENOL) 325 MG tablet Take 2 tablets (650 mg total) by mouth every 6 (six) hours as needed for mild pain (or Fever >/= 101).    Marland Kitchen dexamethasone (DECADRON) 4  MG tablet Take 2 tablets (8 mg total) by mouth daily. Take daily x 3 days starting the day after cisplatin chemotherapy. Take with food. 30 tablet 1  . LORazepam (ATIVAN) 0.5 MG tablet Take 1 tablet (0.5 mg total) by mouth every 12 (twelve) hours as needed for anxiety. 30 tablet 0  . nicotine (NICODERM CQ - DOSED IN MG/24 HOURS) 21 mg/24hr patch Place 1 patch (21 mg total) onto the skin daily. 28 patch 0  . Nutritional Supplements (FEEDING SUPPLEMENT,  OSMOLITE 1.5 CAL,) LIQD Place 474 mLs into feeding tube 4 (four) times daily.  0  . ondansetron (ZOFRAN) 8 MG tablet Take 1 tablet (8 mg total) by mouth 2 (two) times daily as needed. Start on the third day after cisplatin chemotherapy. 30 tablet 1  . prochlorperazine (COMPAZINE) 10 MG tablet Take 1 tablet (10 mg total) by mouth every 6 (six) hours as needed (Nausea or vomiting). 30 tablet 1   No current facility-administered medications for this visit.   No Known Allergies  LABS: Lab Results  Component Value Date   WBC 7.6 05/01/2020   HGB 9.5 (L) 05/30/2020   HCT 28.0 (L) 05/30/2020   MCV 91.5 05/01/2020   PLT 391 05/01/2020   BMET    Component Value Date/Time   NA 133 (L) 05/30/2020 1310   K 4.4 05/30/2020 1310   CL 97 (L) 05/30/2020 1310   CO2 31 05/01/2020 0524   GLUCOSE 75 05/30/2020 1310   BUN 8 05/30/2020 1310   CREATININE 0.30 (L) 05/30/2020 1310   CALCIUM 9.0 05/01/2020 0524   GFRNONAA >60 05/01/2020 0524    Lab Results  Component Value Date   INR 1.4 (H) 04/17/2020   No results found for: PTT   VITALS: BP 110/64 (BP Location: Right Arm)   Pulse (!) 57   Temp 98.3 F (36.8 C) (Oral)    CLINICAL EXAM: . Extraction sites appear to be healing WNL.  No signs of wound dehiscence or infection evident upon examination.  No sutures remain in-tact. . Small area of exposed bone in the upper right quadrant where small root tips were extracted.  No signs of infection, erythema or edema.   RADIOGRAPHIC EXAM:  1 Periapical radiograph exposed and interpreted of the upper right quadrant area of exposed bone.  >> No remaining or impacted root tips in the URQ.  Evidence of recent extraction sites appear to be healing WNL.     ASSESSMENT:   . Postoperative course is consistent with dental procedures performed.  The patient is healing slower than normal in some areas, but there are no signs of acute infection, purulence or  edema/erythema.   PROCEDURES: . Postoperative exam. 1. The patient was given a chlorhexidine gluconate rinse for 30 seconds.  2. Irrigated extraction sites in the upper left quadrant with copious saline.   PLAN: . Faythe Ghee to start radiation as planned with no current dental concerns.  The region of exposed bone will likely heal over time and will not be receiving radiation throughout treatment. Marland Kitchen Rx: 7-day course of Amoxicillin to fight off any potential infection that may be developing and help aid in healing, NORCO prescribed for acute severe pain to take as needed, and refill for Peridex mouthrinse to continue using as needed and to aid in healing.  . Return following the completion of radiation therapy. . Call if any questions or concerns arise.  o All questions and concerns were invited and addressed.  The patient tolerated today's visit  well and departed in stable condition.   Cisco Benson [place], D.M.D.

## 2020-06-12 ENCOUNTER — Encounter: Payer: Self-pay | Admitting: Hematology and Oncology

## 2020-06-12 ENCOUNTER — Inpatient Hospital Stay: Payer: Medicaid Other

## 2020-06-12 ENCOUNTER — Telehealth: Payer: Self-pay | Admitting: Hematology and Oncology

## 2020-06-12 ENCOUNTER — Inpatient Hospital Stay (HOSPITAL_BASED_OUTPATIENT_CLINIC_OR_DEPARTMENT_OTHER): Payer: Medicaid Other | Admitting: Hematology and Oncology

## 2020-06-12 ENCOUNTER — Other Ambulatory Visit: Payer: Self-pay

## 2020-06-12 DIAGNOSIS — Z510 Encounter for antineoplastic radiation therapy: Secondary | ICD-10-CM | POA: Diagnosis not present

## 2020-06-12 DIAGNOSIS — R634 Abnormal weight loss: Secondary | ICD-10-CM | POA: Diagnosis not present

## 2020-06-12 DIAGNOSIS — C320 Malignant neoplasm of glottis: Secondary | ICD-10-CM

## 2020-06-12 DIAGNOSIS — Z930 Tracheostomy status: Secondary | ICD-10-CM | POA: Diagnosis not present

## 2020-06-12 LAB — BASIC METABOLIC PANEL - CANCER CENTER ONLY
Anion gap: 10 (ref 5–15)
BUN: 11 mg/dL (ref 6–20)
CO2: 26 mmol/L (ref 22–32)
Calcium: 9.5 mg/dL (ref 8.9–10.3)
Chloride: 96 mmol/L — ABNORMAL LOW (ref 98–111)
Creatinine: 0.54 mg/dL — ABNORMAL LOW (ref 0.61–1.24)
GFR, Estimated: >60 mL/min (ref >60)
Glucose, Bld: 90 mg/dL (ref 70–99)
Potassium: 4.5 mmol/L (ref 3.5–5.1)
Sodium: 132 mmol/L — ABNORMAL LOW (ref 135–145)

## 2020-06-12 LAB — CBC WITH DIFFERENTIAL (CANCER CENTER ONLY)
Abs Immature Granulocytes: 0.13 10*3/uL — ABNORMAL HIGH (ref 0.00–0.07)
Basophils Absolute: 0 10*3/uL (ref 0.0–0.1)
Basophils Relative: 0 %
Eosinophils Absolute: 0.1 10*3/uL (ref 0.0–0.5)
Eosinophils Relative: 1 %
HCT: 36.2 % — ABNORMAL LOW (ref 39.0–52.0)
Hemoglobin: 11.8 g/dL — ABNORMAL LOW (ref 13.0–17.0)
Immature Granulocytes: 1 %
Lymphocytes Relative: 21 %
Lymphs Abs: 2.6 10*3/uL (ref 0.7–4.0)
MCH: 30.3 pg (ref 26.0–34.0)
MCHC: 32.6 g/dL (ref 30.0–36.0)
MCV: 92.8 fL (ref 80.0–100.0)
Monocytes Absolute: 1.1 10*3/uL — ABNORMAL HIGH (ref 0.1–1.0)
Monocytes Relative: 9 %
Neutro Abs: 8.2 10*3/uL — ABNORMAL HIGH (ref 1.7–7.7)
Neutrophils Relative %: 68 %
Platelet Count: 349 10*3/uL (ref 150–400)
RBC: 3.9 MIL/uL — ABNORMAL LOW (ref 4.22–5.81)
RDW: 14.3 % (ref 11.5–15.5)
WBC Count: 12.2 10*3/uL — ABNORMAL HIGH (ref 4.0–10.5)
nRBC: 0 % (ref 0.0–0.2)

## 2020-06-12 LAB — MAGNESIUM: Magnesium: 1.5 mg/dL — ABNORMAL LOW (ref 1.7–2.4)

## 2020-06-12 NOTE — Assessment & Plan Note (Addendum)
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  He is now status post dental extractions and will start his radiation tomorrow 06/13/2020 His dental extraction site appears to be well enough to proceed with chemotherapy.  I am also worried about his rapidly progressing disease, some neck and face swelling and the need for immediate treatment.  Hence we will proceed with chemotherapy tomorrow He understands adverse effects of cisplatin, we have talked about this on multiple occasions.  He does have some baseline hearing deficit and he understands that the hearing loss can be worse and permanent from his chemotherapy. Labs from today reviewed, mild leukocytosis, otherwise hypomagnesemia.  He is okay to proceed with his chemotherapy tomorrow, he will receive magnesium replacement after his infusion.

## 2020-06-12 NOTE — Assessment & Plan Note (Signed)
This hopefully will improve after initiation of concurrent chemoradiation as the tumor starts to respond to treatment.

## 2020-06-12 NOTE — Progress Notes (Signed)
Met with patient/accompanying adult at registration to introduce myself as Arboriculturist and to offer available resources.  Discussed one-time $1000 Radio broadcast assistant to assist with gas cards and personal expenses while going through treatment. Advised what is needed to apply and may bring tomorrow to complete grant process.  Gave them my card for any additional financial questions or concerns.

## 2020-06-12 NOTE — Assessment & Plan Note (Signed)
This has been improving since he has G-tube in place for nourishment.  He has gained 2 to 3 pounds since his last visit. We will continue to encourage G-tube nourishment and we will follow-up on his weight.

## 2020-06-12 NOTE — Progress Notes (Signed)
Per Dr Chryl Heck, ok to treat with Magnesium 1.5

## 2020-06-12 NOTE — Telephone Encounter (Signed)
Scheduled per los. Gave avs and calendar  

## 2020-06-12 NOTE — Progress Notes (Signed)
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  He is now status post dental extractions and will start his radiation tomorrow 06/13/2020 His dental extraction site appears to be well enough to proceed with chemotherapy.  I am also worried about his rapidly progressing disease, some neck and face swelling and the need for immediate treatment.  Hence we will proceed with chemotherapy tomorrow He understands adverse effects of cisplatin, we have talked about this on multiple occasions.  He does have some baseline hearing deficit and he understands that the hearing loss can be worse and permanent from his chemotherapy. Labs from today reviewed, mild leukocytosis, otherwise hypomagnesemia.  He is okay to proceed with his chemotherapy tomorrow, he will receive magnesium replacement after his infusion.  Tracheostomy dependence ([place]) This hopefully will improve after initiation of concurrent chemoradiation as the tumor starts to respond to treatment.  Weight loss, unintentional This has been improving since he has G-tube in place for nourishment.  He has gained 2 to 3 pounds since his last visit. We will continue to encourage G-tube nourishment and we will follow-up on his weight.  No orders of the defined types were placed in this  encounter.   HISTORY OF PRESENTING ILLNESS:   Mario Peters 55 y.o. male is here because of newly diagnosed laryngeal SCC  Oncology History Overview Note  Mario Peters is a 56 year old male with a past medical history significant for alcohol tobacco abuse, and history of seizures.  The patient presented with severe dysphagia, cough, fatigue.  He has had significant weight loss recently and hoarseness of his voice.  Symptoms present for approximately 4 months.  CT of the chest on admission showed diffuse bilateral pulmonary parenchymal changes consistent with multifocal pneumonia possible related to aspiration.  CT of the neck showed mildly hyperdense focus at the posterior aspect of the left vocal fold with associated soft tissue thickening which could be laryngeal neoplasm.  He was seen by ENT and flex fiberoptic lower discopathy was performed showing a large partially obstructing laryngeal tumor.  He was taken to the OR on 04/22/2020 underwent tracheostomy and biopsy.  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  P16 negative.   He is status post G-tube placement by IR on 04/24/2020.   He was initially seen in the hospital. Recommendation was to proceed with outpatient PET CT and return to follow up with medical oncology outpatient. PET CT without any clear evidence of metastatic disease.   Glottis carcinoma ([place])  05/08/2020 Initial Diagnosis   Glottis carcinoma ([place])   05/08/2020 Cancer Staging   Staging form: Larynx - Glottis, AJCC 8th Edition - Clinical stage from 05/08/2020: Stage IVA (cT3, cN2c, cM0) - Signed by Benay Pike, MD on 05/27/2020 Stage prefix: Initial diagnosis   06/13/2020 -  Chemotherapy    Patient is on Treatment Plan: HEAD/NECK CISPLATIN Q7D       Interval History  Patient is here for a follow-up with his sister.  Since his last visit, he continues to have worsening leg swelling and edema progression.  He complains of  some pain in the neck.  He also had dental extractions and has some throbbing pain in his mouth.  No fevers or chills.  He is still active, walks around independent with all his activities of daily living.  He does have some baseline hearing deficit which may have gotten slightly worse according to his sister.  No change in bowel habits or urinary habits.  He is using G-tube mostly for nourishment. Rest of the pertinent 10 point ROS reviewed and negative.  MEDICAL HISTORY:  Past Medical History:  Diagnosis Date  . Cancer ([place])   . Seizures ([place])     SURGICAL HISTORY: Past Surgical History:  Procedure Laterality Date  . DIRECT LARYNGOSCOPY N/A 04/22/2020   Procedure: DIRECT LARYNGOSCOPY WITH BIOPSY;  Surgeon: [name], MD;  Location: [place];  Service: ENT;  Laterality: N/A;  . ESOPHAGOSCOPY  04/22/2020   Procedure: ESOPHAGOSCOPY;  Surgeon: [name], MD;  Location: [place];  Service: ENT;;  . IR GASTROSTOMY TUBE MOD SED  04/24/2020  . MULTIPLE EXTRACTIONS WITH ALVEOLOPLASTY N/A 05/30/2020   Procedure: MULTIPLE EXTRACTION WITH ALVEOLOPLASTY;  Surgeon: [name], DMD;  Location: [place];  Service: Dentistry;  Laterality: N/A;  . TRACHEOSTOMY TUBE PLACEMENT N/A 04/22/2020   Procedure: TRACHEOSTOMY;  Surgeon: [name], MD;  Location: [place];  Service: ENT;  Laterality: N/A;    SOCIAL HISTORY: Social History   Socioeconomic History  . Marital status: Married    Spouse name: Not on file  . Number of children: Not on file  . Years of education: Not on file  . Highest education level: Not on file  Occupational History  . Not on file  Tobacco Use  . Smoking status: Former Smoker    Packs/day: 2.00  . Smokeless tobacco: Never Used  Vaping Use  . Vaping Use: Never used  Substance and Sexual Activity  . 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 Drug use: Not Currently    Types: Marijuana  . Sexual activity: Not on file  Other Topics Concern  . Not on  file  Social History Narrative  . Not on file   Social Determinants of Health   Financial Resource Strain: Not on file  Food Insecurity: Not on file  Transportation Needs: Not on file  Physical Activity: Not on file  Stress: Not on file  Social Connections: Not on file  Intimate Partner Violence: Not on file    FAMILY HISTORY: Family History  Problem Relation Age of Onset  . Hypertension Other     ALLERGIES:  has No Known Allergies.  MEDICATIONS:  Current Outpatient Medications  Medication Sig Dispense Refill  . acetaminophen (TYLENOL) 325 MG tablet Take 2 tablets (650 mg total) by mouth every 6 (six) hours as needed for mild pain (or Fever >/= 101).    Marland Kitchen amoxicillin (AMOXIL) 500 MG capsule Take 1 capsule (500 mg total) by mouth in the morning, at noon, and at bedtime for 7 days. 21 capsule 0  . chlorhexidine (PERIDEX) 0.12 % solution Rinse mouth with 15 mLs 2 (two) times daily for 14 days. Do not swallow. 473 mL 0  . dexamethasone (DECADRON) 4 MG tablet Take 2 tablets (8 mg total) by mouth daily. Take daily x 3 days starting the day after cisplatin chemotherapy.  Take with food. 30 tablet 1  . LORazepam (ATIVAN) 0.5 MG tablet Take 1 tablet (0.5 mg total) by mouth every 12 (twelve) hours as needed for anxiety. 30 tablet 0  . nicotine (NICODERM CQ - DOSED IN MG/24 HOURS) 21 mg/24hr patch Place 1 patch (21 mg total) onto the skin daily. 28 patch 0  . Nutritional Supplements (FEEDING SUPPLEMENT, OSMOLITE 1.5 CAL,) LIQD Place 474 mLs into feeding tube 4 (four) times daily.  0  . ondansetron (ZOFRAN) 8 MG tablet Take 1 tablet (8 mg total) by mouth 2 (two) times daily as needed. Start on the third day after cisplatin chemotherapy. 30 tablet 1  . prochlorperazine (COMPAZINE) 10 MG tablet Take 1 tablet (10 mg total) by mouth every 6 (six) hours as needed (Nausea or vomiting). 30 tablet 1   No current facility-administered medications for this visit.    PHYSICAL EXAMINATION:  ECOG  PERFORMANCE STATUS: 1 - Symptomatic but completely ambulatory  Vitals:   06/12/20 1427  BP: 131/67  Pulse: 73  Resp: 17  Temp: 97.9 F (36.6 C)  SpO2: 100%   Filed Weights   06/12/20 1427  Weight: 121 lb 8 oz (55.1 kg)    Physical Exam Constitutional:      Appearance: Normal appearance.  HENT:     Head: Normocephalic and atraumatic.     Mouth/Throat:     Mouth: Mucous membranes are moist.     Comments: Teeth appear to be well to proceed with chemotherapy Neck:     Comments: Trach in place. Neck is swollen, some swelling of face. Cardiovascular:     Rate and Rhythm: Normal rate and regular rhythm.     Pulses: Normal pulses.     Heart sounds: Normal heart sounds.  Pulmonary:     Effort: Pulmonary effort is normal.     Breath sounds: Normal breath sounds.  Abdominal:     General: Abdomen is flat.     Palpations: Abdomen is soft.     Comments: G-tube in place  Musculoskeletal:        General: No swelling or tenderness.  Lymphadenopathy:     Cervical: Cervical adenopathy (Bilateral adenopathy appears much more prominent than last visit suggestive of tumor progression) present.  Skin:    General: Skin is warm and dry.  Neurological:     General: No focal deficit present.     Mental Status: He is alert.  Psychiatric:        Mood and Affect: Mood normal.        Behavior: Behavior normal.       LABORATORY DATA:  I have reviewed the data as listed Lab Results  Component Value Date   WBC 12.2 (H) 06/12/2020   HGB 11.8 (L) 06/12/2020   HCT 36.2 (L) 06/12/2020   MCV 92.8 06/12/2020   PLT 349 06/12/2020     Chemistry      Component Value Date/Time   NA 132 (L) 06/12/2020 1346   K 4.5 06/12/2020 1346   CL 96 (L) 06/12/2020 1346   CO2 26 06/12/2020 1346   BUN 11 06/12/2020 1346   CREATININE 0.54 (L) 06/12/2020 1346      Component Value Date/Time   CALCIUM 9.5 06/12/2020 1346   ALKPHOS 58 04/29/2020 0116   AST 29 04/29/2020 0116   ALT 19 04/29/2020 0116    BILITOT 0.5 04/29/2020 0116       RADIOGRAPHIC STUDIES: I have personally reviewed the radiological images as listed and agreed with  the findings in the report. NM PET Image Initial (PI) Skull Base To Thigh  Result Date: 05/23/2020 CLINICAL DATA:  Initial treatment strategy for head and neck carcinoma. Squamous cell carcinoma the LEFT glottis. Tracheostomy 05/02/2020. EXAM: NUCLEAR MEDICINE PET SKULL BASE TO THIGH TECHNIQUE: 5.7 mCi F-18 FDG was injected intravenously. Full-ring PET imaging was performed from the skull base to thigh after the radiotracer. CT data was obtained and used for attenuation correction and anatomic localization. Fasting blood glucose: 85 mg/dl COMPARISON:  Neck CT 04/19/2030 FINDINGS: Mediastinal blood pool activity: SUV max 1.74 Liver activity: SUV max 2.7 NECK: Intense metabolic activity in the glottic and supraglottic tissue with SUV max equal 11.5. Activity involves LEFT and RIGHT glottis. Activity extends superiorly on the RIGHT to the vallecula with SUV max equal 9.5. Additionally, there is asymmetric hypermetabolic activity associated with the LEFT palatine tonsil region with SUV max equal 6. 2 on image 22. Bilateral hypermetabolic level 2 lymph nodes beneath the sternocleidomastoid muscles nodes are difficult to define on noncontrast exam but activity is intense with SUV max equal 6.3 on the LEFT (image 35) and with SUV max equal 10.2 on the RIGHT ( image 37). Incidental CT findings: Metabolic activity associated with tracheostomy tube tract consider inflammatory. CHEST: Precarinal node measures 10 mm (image 71/4 ) with SUV max equal 2.6 which is slightly above background blood pool activity. No hilar activity. Small 3 mm RIGHT upper lobe nodule (image 70/CT series 4) with no metabolic activity. Small subpleural nodule in the RIGHT lower lobe measures 2 mm image 81. Smudgy subpleural nodule RIGHT upper lobe measuring 3 mm image 64. There is mild ground-glass densities in the  RIGHT lower lobe (image 94) which appear infectious. Near complete resolution of diffuse bilateral nodularity seen on CT 03/21/2020. Incidental CT findings: none ABDOMEN/PELVIS: No hypermetabolic activity in the liver. No hypermetabolic abdominopelvic lymph nodes. Benign activity associated with the percutaneous gastrostomy tube. Incidental CT findings: none SKELETON: No focal hypermetabolic activity to suggest skeletal metastasis. Incidental CT findings: none IMPRESSION: 1. Intense metabolic activity associated with the bilateral glottis and supraglottic tissue extending superiorly on the RIGHT to the level of the vallecula. 2. Asymmetric hypermetabolic activity associated with the LEFT palatine tonsil region. 3. Bilateral hypermetabolic level II lymph nodes beneath the sternocleidomastoid muscles consistent with metastatic adenopathy. 4. Metabolic activity associated with the tracheostomy tube track is favored benign. 5. Small subcarinal lymph node with mild metabolic activity is favored benign. 6. Three small pulmonary nodules in the RIGHT lung are indeterminate. Extensive pulmonary nodularity on CT from 04/17/2020 as no clear resolved. Recommend attention on routine surveillance. Electronically Signed   By: Suzy Bouchard M.D.   On: 05/23/2020 08:59   DG Swallowing Func-Speech Pathology  Result Date: 05/24/2020 Objective Swallowing Evaluation: Type of Study: MBS-Modified Barium Swallow Study  Patient Details Name: Mario Peters MRN: [id number] Date of Birth: September 02, 1965 Today's Date: 05/24/2020 Time: SLP Start Time (ACUTE ONLY): 1136 -SLP Stop Time (ACUTE ONLY): 1202 SLP Time Calculation (min) (ACUTE ONLY): 26 min Past Medical History: Past Medical History: Diagnosis Date . Seizures ([place])  Past Surgical History: Past Surgical History: Procedure Laterality Date . DIRECT LARYNGOSCOPY N/A 04/22/2020  Procedure: DIRECT LARYNGOSCOPY WITH BIOPSY;  Surgeon: [name], MD;  Location: [facility];  Service: ENT;   Laterality: N/A; . ESOPHAGOSCOPY  04/22/2020  Procedure: ESOPHAGOSCOPY;  Surgeon: [name], MD;  Location: [facility];  Service: ENT;; . IR GASTROSTOMY TUBE MOD SED  04/24/2020 . TRACHEOSTOMY TUBE PLACEMENT N/A 04/22/2020  Procedure: TRACHEOSTOMY;  Surgeon: [name], MD;  Location: [facility];  Service: ENT;  Laterality: N/A; HPI: Pt seen for outpatient MBS accompanied by his sister. PMH: EtOH abuse,  tobacco abuse, remote hx of seizures. Pt hospitalized 04/2018 and laryngeal neoplasm suspected and pathology confirmed squamous cell carcinoma. Received trach 4/4 and has G-tube.  Pt's sister reports pt has an appointment next week to decide on surgery/laryngectomy and/or radiation. Trach in place. MBS 04/25/20 revealed severe aspiration and NPO recommended. Per pt's sister, "a doctor recommended to start food/liquid" and pt has been eating and drinking for 8 days. He has been coughing with po's and MBS recommended today.  Subjective: -- (alert, talking with PMV placed intermittently) Assessment / Plan / Recommendation CHL IP CLINICAL IMPRESSIONS 05/24/2020 Clinical Impression Pt continues to exhibit severe pharyngeal dysphagia marked by decreased anterior hyoid excursion, laryngeal elevation and incompetent epiglottic deflection in setting of laryngeal mass. MBS performed without PMV in place due to air trapping/back pressure noted after donned for 1 minute and pt does not wear valve at home except periods of phonation. Aspiration prior to the swallow of thin and duirng with significant vallecular and pyriform sinus residue. Verbal cues for multiple swallows and additional thin did not clear. Valve was placed briefly to provide additional pressure for cough. He was able to cough and expectorate pharyngeal residue into oral cavity. Pt to continue recommendation of NPO, use G-tube for nourishment and frequent oral care. Depending on plan of treatment, he will need follow up for dysphagia. SLP Visit Diagnosis Dysphagia, pharyngeal  phase (R13.13) Attention and concentration deficit following -- Frontal lobe and executive function deficit following -- Impact on safety and function Severe aspiration risk   CHL IP TREATMENT RECOMMENDATION 05/24/2020 Treatment Recommendations Defer treatment plan to f/u with SLP   No flowsheet data found. CHL IP DIET RECOMMENDATION 05/24/2020 SLP Diet Recommendations NPO Liquid Administration via -- Medication Administration Via alternative means Compensations -- Postural Changes --   CHL IP OTHER RECOMMENDATIONS 05/24/2020 Recommended Consults -- Oral Care Recommendations Oral care QID Other Recommendations --   CHL IP FOLLOW UP RECOMMENDATIONS 05/24/2020 Follow up Recommendations Outpatient SLP   CHL IP FREQUENCY AND DURATION 04/25/2020 Speech Therapy Frequency (ACUTE ONLY) min 2x/week Treatment Duration 2 weeks      CHL IP ORAL PHASE 05/24/2020 Oral Phase WFL Oral - Pudding Teaspoon -- Oral - Pudding Cup -- Oral - Honey Teaspoon -- Oral - Honey Cup -- Oral - Nectar Teaspoon -- Oral - Nectar Cup -- Oral - Nectar Straw -- Oral - Thin Teaspoon -- Oral - Thin Cup -- Oral - Thin Straw -- Oral - Puree -- Oral - Mech Soft -- Oral - Regular -- Oral - Multi-Consistency -- Oral - Pill -- Oral Phase - Comment --  CHL IP PHARYNGEAL PHASE 05/24/2020 Pharyngeal Phase Impaired Pharyngeal- Pudding Teaspoon -- Pharyngeal -- Pharyngeal- Pudding Cup -- Pharyngeal -- Pharyngeal- Honey Teaspoon -- Pharyngeal -- Pharyngeal- Honey Cup -- Pharyngeal -- Pharyngeal- Nectar Teaspoon -- Pharyngeal -- Pharyngeal- Nectar Cup NT Pharyngeal -- Pharyngeal- Nectar Straw -- Pharyngeal -- Pharyngeal- Thin Teaspoon -- Pharyngeal -- Pharyngeal- Thin Cup Penetration/Aspiration before swallow;Pharyngeal residue - valleculae;Pharyngeal residue - pyriform;Reduced epiglottic inversion;Reduced anterior laryngeal mobility;Reduced laryngeal elevation Pharyngeal Material enters airway, passes BELOW cords without attempt by patient to eject out (silent aspiration)  Pharyngeal- Thin Straw -- Pharyngeal -- Pharyngeal- Puree NT Pharyngeal -- Pharyngeal- Mechanical Soft -- Pharyngeal -- Pharyngeal- Regular Pharyngeal residue - valleculae;Pharyngeal residue - pyriform;Reduced anterior laryngeal mobility;Reduced laryngeal elevation  Pharyngeal -- Pharyngeal- Multi-consistency -- Pharyngeal -- Pharyngeal- Pill -- Pharyngeal -- Pharyngeal Comment --  CHL IP CERVICAL ESOPHAGEAL PHASE 05/24/2020 Cervical Esophageal Phase WFL Pudding Teaspoon -- Pudding Cup -- Honey Teaspoon -- Honey Cup -- Nectar Teaspoon -- Nectar Cup -- Nectar Straw -- Thin Teaspoon -- Thin Cup -- Thin Straw -- Puree -- Mechanical Soft -- Regular -- Multi-consistency -- Pill -- Cervical Esophageal Comment -- [place] Siren 05/24/2020, 3:02 PM      Orbie Pyo Colvin Caroli.Ed Actor Pager [phone] Office [phone]         Labs reviewed, ok to proceed.   Benay Pike, MD 06/12/2020 4:08 PM

## 2020-06-13 ENCOUNTER — Inpatient Hospital Stay: Payer: Medicaid Other

## 2020-06-13 ENCOUNTER — Other Ambulatory Visit: Payer: Medicaid Other

## 2020-06-13 ENCOUNTER — Ambulatory Visit
Admission: RE | Admit: 2020-06-13 | Discharge: 2020-06-13 | Disposition: A | Discharge status: Home / Self Care | Payer: Medicaid Other | Source: Ambulatory Visit | Attending: Radiation Oncology | Admitting: Radiation Oncology

## 2020-06-13 VITALS — BP 121/73 | HR 69 | Temp 98.1°F | Resp 18

## 2020-06-13 DIAGNOSIS — Z510 Encounter for antineoplastic radiation therapy: Secondary | ICD-10-CM | POA: Diagnosis not present

## 2020-06-13 DIAGNOSIS — C320 Malignant neoplasm of glottis: Secondary | ICD-10-CM

## 2020-06-13 MED ORDER — SODIUM CHLORIDE 0.9 % IV SOLN
40.0000 mg/m2 | Freq: Once | INTRAVENOUS | Status: AC
  Administered 2020-06-13: 63 mg via INTRAVENOUS
  Filled 2020-06-13: qty 63

## 2020-06-13 MED ORDER — DEXAMETHASONE SODIUM PHOSPHATE 100 MG/10ML IJ SOLN
10.0000 mg | Freq: Once | INTRAMUSCULAR | Status: AC
  Administered 2020-06-13: 10 mg via INTRAVENOUS
  Filled 2020-06-13: qty 10

## 2020-06-13 MED ORDER — SODIUM CHLORIDE 0.9 % IV SOLN
150.0000 mg | Freq: Once | INTRAVENOUS | Status: AC
  Administered 2020-06-13: 150 mg via INTRAVENOUS
  Filled 2020-06-13: qty 150

## 2020-06-13 MED ORDER — SODIUM CHLORIDE 0.9 % IV SOLN
Freq: Once | INTRAVENOUS | Status: AC
  Filled 2020-06-13: qty 250

## 2020-06-13 MED ORDER — POTASSIUM CHLORIDE IN NACL 20-0.9 MEQ/L-% IV SOLN
Freq: Once | INTRAVENOUS | Status: AC
Start: 2020-06-13 — End: 2020-06-13
  Filled 2020-06-13: qty 1000

## 2020-06-13 MED ORDER — PALONOSETRON HCL INJECTION 0.25 MG/5ML
0.2500 mg | Freq: Once | INTRAVENOUS | Status: AC
  Administered 2020-06-13: 0.25 mg via INTRAVENOUS

## 2020-06-13 MED ORDER — MAGNESIUM SULFATE 2 GM/50ML IV SOLN
2.0000 g | Freq: Once | INTRAVENOUS | Status: AC
  Administered 2020-06-13: 2 g via INTRAVENOUS

## 2020-06-13 MED ORDER — SODIUM CHLORIDE 0.9 % IV SOLN: Freq: Once | INTRAVENOUS | Status: DC

## 2020-06-13 MED ORDER — PALONOSETRON HCL INJECTION 0.25 MG/5ML
INTRAVENOUS | Status: AC
  Filled 2020-06-13: qty 5

## 2020-06-13 MED ORDER — MAGNESIUM SULFATE 2 GM/50ML IV SOLN
INTRAVENOUS | Status: AC
  Filled 2020-06-13: qty 50

## 2020-06-13 NOTE — Progress Notes (Signed)
Oncology Nurse Navigator Documentation  To provide support, encouragement and care continuity, met with Mr. Mario Peters for his initial RT and chemotherapy infusion.  His sister, Mario Peters dropped him off and picked him up today.   I reviewed the 2-step treatment process, answered questions related to his radiation treatment.   [name] completed treatment without difficulty, denied questions/concerns.  I reviewed the registration/arrival procedure for subsequent treatments.  I provided his sister Mario Peters with an updated schedule which included his appointments SLP, PT, and his PAC placement on 6/2.  I encouraged them to call me with questions/concerns as tmts proceed.   Harlow Asa RN, BSN, OCN Head & Neck Oncology Nurse [facility] Phone # [phone]  Fax # [phone]

## 2020-06-13 NOTE — Patient Instructions (Signed)
[facility] MEDICAL ONCOLOGY  Discharge Instructions: Thank you for choosing [facility] to provide your oncology and hematology care.   If you have a lab appointment with the Cancer Center, please go directly to the Cancer Center and check in at the registration area.   Wear comfortable clothing and clothing appropriate for easy access to any Portacath or PICC line.   We strive to give you quality time with your provider. You may need to reschedule your appointment if you arrive late (15 or more minutes).  Arriving late affects you and other patients whose appointments are after yours.  Also, if you miss three or more appointments without notifying the office, you may be dismissed from the clinic at the provider's discretion.      For prescription refill requests, have your pharmacy contact our office and allow 72 hours for refills to be completed.    Today you received the following chemotherapy and/or immunotherapy agents cisplatin   To help prevent nausea and vomiting after your treatment, we encourage you to take your nausea medication as directed.  BELOW ARE SYMPTOMS THAT SHOULD BE REPORTED IMMEDIATELY: *FEVER GREATER THAN 100.4 F (38 C) OR HIGHER *CHILLS OR SWEATING *NAUSEA AND VOMITING THAT IS NOT CONTROLLED WITH YOUR NAUSEA MEDICATION *UNUSUAL SHORTNESS OF BREATH *UNUSUAL BRUISING OR BLEEDING *URINARY PROBLEMS (pain or burning when urinating, or frequent urination) *BOWEL PROBLEMS (unusual diarrhea, constipation, pain near the anus) TENDERNESS IN MOUTH AND THROAT WITH OR WITHOUT PRESENCE OF ULCERS (sore throat, sores in mouth, or a toothache) UNUSUAL RASH, SWELLING OR PAIN  UNUSUAL VAGINAL DISCHARGE OR ITCHING   Items with * indicate a potential emergency and should be followed up as soon as possible or go to the Emergency Department if any problems should occur.  Please show the CHEMOTHERAPY ALERT CARD or IMMUNOTHERAPY ALERT CARD at check-in to the  Emergency Department and triage nurse.  Should you have questions after your visit or need to cancel or reschedule your appointment, please contact [facility]  Dept: [phone]  and follow the prompts.  Office hours are 8:00 a.m. to 4:30 p.m. Monday - Friday. Please note that voicemails left after 4:00 p.m. may not be returned until the following business day.  We are closed weekends and major holidays. You have access to a nurse at all times for urgent questions. Please call the main number to the clinic Dept: [phone] and follow the prompts.   For any non-urgent questions, you may also contact your provider using MyChart. We now offer e-Visits for anyone 18 and older to request care online for non-urgent symptoms. For details visit [email or web address].   Also download the MyChart app! Go to the app store, search "MyChart", open the app, select [facility], and log in with your MyChart username and password.  Due to Covid, a mask is required upon entering the hospital/clinic. If you do not have a mask, one will be given to you upon arrival. For doctor visits, patients may have 1 support person aged 18 or older with them. For treatment visits, patients cannot have anyone with them due to current Covid guidelines and our immunocompromised population.   

## 2020-06-14 ENCOUNTER — Telehealth: Payer: Self-pay

## 2020-06-14 ENCOUNTER — Other Ambulatory Visit: Payer: Self-pay

## 2020-06-14 ENCOUNTER — Ambulatory Visit
Admission: RE | Admit: 2020-06-14 | Discharge: 2020-06-14 | Disposition: A | Discharge status: Home / Self Care | Payer: Medicaid Other | Source: Ambulatory Visit | Attending: Radiation Oncology | Admitting: Radiation Oncology

## 2020-06-14 DIAGNOSIS — Z510 Encounter for antineoplastic radiation therapy: Secondary | ICD-10-CM | POA: Diagnosis not present

## 2020-06-14 NOTE — Telephone Encounter (Signed)
Attempted to call to follow up on patient's first chemo treatment. No answer. Voicemail not set up. Will reach out to patient/family Tuesday.

## 2020-06-18 ENCOUNTER — Other Ambulatory Visit: Payer: Self-pay | Admitting: Radiology

## 2020-06-18 ENCOUNTER — Ambulatory Visit
Admission: RE | Admit: 2020-06-18 | Discharge: 2020-06-18 | Disposition: A | Discharge status: Home / Self Care | Payer: Medicaid Other | Source: Ambulatory Visit | Attending: Radiation Oncology | Admitting: Radiation Oncology

## 2020-06-18 ENCOUNTER — Other Ambulatory Visit: Payer: Self-pay

## 2020-06-18 DIAGNOSIS — Z510 Encounter for antineoplastic radiation therapy: Secondary | ICD-10-CM | POA: Diagnosis not present

## 2020-06-19 ENCOUNTER — Ambulatory Visit
Admission: RE | Admit: 2020-06-19 | Discharge: 2020-06-19 | Disposition: A | Discharge status: Home / Self Care | Payer: Medicaid Other | Source: Ambulatory Visit | Attending: Radiation Oncology | Admitting: Radiation Oncology

## 2020-06-19 ENCOUNTER — Other Ambulatory Visit: Payer: Self-pay | Admitting: Student

## 2020-06-19 DIAGNOSIS — R293 Abnormal posture: Secondary | ICD-10-CM | POA: Insufficient documentation

## 2020-06-19 DIAGNOSIS — Z510 Encounter for antineoplastic radiation therapy: Secondary | ICD-10-CM | POA: Insufficient documentation

## 2020-06-19 DIAGNOSIS — Z87891 Personal history of nicotine dependence: Secondary | ICD-10-CM | POA: Insufficient documentation

## 2020-06-19 DIAGNOSIS — G4700 Insomnia, unspecified: Secondary | ICD-10-CM | POA: Diagnosis not present

## 2020-06-19 DIAGNOSIS — C320 Malignant neoplasm of glottis: Secondary | ICD-10-CM | POA: Diagnosis present

## 2020-06-19 DIAGNOSIS — R590 Localized enlarged lymph nodes: Secondary | ICD-10-CM | POA: Diagnosis not present

## 2020-06-19 DIAGNOSIS — R1313 Dysphagia, pharyngeal phase: Secondary | ICD-10-CM | POA: Insufficient documentation

## 2020-06-19 DIAGNOSIS — Z5111 Encounter for antineoplastic chemotherapy: Secondary | ICD-10-CM | POA: Diagnosis not present

## 2020-06-20 ENCOUNTER — Inpatient Hospital Stay: Payer: Medicaid Other

## 2020-06-20 ENCOUNTER — Ambulatory Visit (HOSPITAL_COMMUNITY)
Admission: RE | Admit: 2020-06-20 | Discharge: 2020-06-20 | Disposition: A | Discharge status: Home / Self Care | Payer: Medicaid Other | Source: Ambulatory Visit | Attending: Hematology and Oncology | Admitting: Hematology and Oncology

## 2020-06-20 ENCOUNTER — Ambulatory Visit
Admission: RE | Admit: 2020-06-20 | Discharge: 2020-06-20 | Disposition: A | Discharge status: Home / Self Care | Payer: Medicaid Other | Source: Ambulatory Visit | Attending: Radiation Oncology | Admitting: Radiation Oncology

## 2020-06-20 ENCOUNTER — Ambulatory Visit: Payer: Medicaid Other

## 2020-06-20 ENCOUNTER — Other Ambulatory Visit: Payer: Self-pay

## 2020-06-20 ENCOUNTER — Inpatient Hospital Stay (HOSPITAL_BASED_OUTPATIENT_CLINIC_OR_DEPARTMENT_OTHER): Payer: Medicaid Other | Admitting: Hematology and Oncology

## 2020-06-20 ENCOUNTER — Ambulatory Visit (INDEPENDENT_AMBULATORY_CARE_PROVIDER_SITE_OTHER): Payer: Medicaid Other | Admitting: Dentistry

## 2020-06-20 ENCOUNTER — Encounter: Payer: Self-pay | Admitting: Hematology and Oncology

## 2020-06-20 ENCOUNTER — Ambulatory Visit (HOSPITAL_BASED_OUTPATIENT_CLINIC_OR_DEPARTMENT_OTHER)
Admission: RE | Admit: 2020-06-20 | Discharge: 2020-06-20 | Disposition: A | Discharge status: Home / Self Care | Payer: Medicaid Other | Source: Ambulatory Visit | Attending: Student | Admitting: Student

## 2020-06-20 ENCOUNTER — Ambulatory Visit: Payer: Medicaid Other | Admitting: Physical Therapy

## 2020-06-20 ENCOUNTER — Encounter (HOSPITAL_COMMUNITY): Payer: Self-pay

## 2020-06-20 ENCOUNTER — Other Ambulatory Visit (HOSPITAL_COMMUNITY): Payer: Self-pay

## 2020-06-20 ENCOUNTER — Other Ambulatory Visit: Payer: Self-pay | Admitting: Hematology and Oncology

## 2020-06-20 ENCOUNTER — Encounter: Payer: Self-pay | Admitting: Physical Therapy

## 2020-06-20 DIAGNOSIS — Z720 Tobacco use: Secondary | ICD-10-CM | POA: Diagnosis not present

## 2020-06-20 DIAGNOSIS — C329 Malignant neoplasm of larynx, unspecified: Secondary | ICD-10-CM | POA: Insufficient documentation

## 2020-06-20 DIAGNOSIS — K08199 Complete loss of teeth due to other specified cause, unspecified class: Secondary | ICD-10-CM

## 2020-06-20 DIAGNOSIS — R293 Abnormal posture: Secondary | ICD-10-CM | POA: Insufficient documentation

## 2020-06-20 DIAGNOSIS — G4700 Insomnia, unspecified: Secondary | ICD-10-CM | POA: Insufficient documentation

## 2020-06-20 DIAGNOSIS — C320 Malignant neoplasm of glottis: Secondary | ICD-10-CM

## 2020-06-20 DIAGNOSIS — R634 Abnormal weight loss: Secondary | ICD-10-CM

## 2020-06-20 DIAGNOSIS — R1313 Dysphagia, pharyngeal phase: Secondary | ICD-10-CM | POA: Insufficient documentation

## 2020-06-20 DIAGNOSIS — I82C29 Chronic embolism and thrombosis of unspecified internal jugular vein: Secondary | ICD-10-CM | POA: Diagnosis not present

## 2020-06-20 DIAGNOSIS — R590 Localized enlarged lymph nodes: Secondary | ICD-10-CM | POA: Insufficient documentation

## 2020-06-20 DIAGNOSIS — Z5111 Encounter for antineoplastic chemotherapy: Secondary | ICD-10-CM | POA: Insufficient documentation

## 2020-06-20 DIAGNOSIS — Z923 Personal history of irradiation: Secondary | ICD-10-CM | POA: Insufficient documentation

## 2020-06-20 DIAGNOSIS — Z87891 Personal history of nicotine dependence: Secondary | ICD-10-CM | POA: Insufficient documentation

## 2020-06-20 DIAGNOSIS — Z510 Encounter for antineoplastic radiation therapy: Secondary | ICD-10-CM | POA: Diagnosis not present

## 2020-06-20 DIAGNOSIS — R221 Localized swelling, mass and lump, neck: Secondary | ICD-10-CM | POA: Insufficient documentation

## 2020-06-20 LAB — CBC WITH DIFFERENTIAL (CANCER CENTER ONLY)
Abs Immature Granulocytes: 0.17 10*3/uL — ABNORMAL HIGH (ref 0.00–0.07)
Basophils Absolute: 0 10*3/uL (ref 0.0–0.1)
Basophils Relative: 0 %
Eosinophils Absolute: 0 10*3/uL (ref 0.0–0.5)
Eosinophils Relative: 0 %
HCT: 38 % — ABNORMAL LOW (ref 39.0–52.0)
Hemoglobin: 12.5 g/dL — ABNORMAL LOW (ref 13.0–17.0)
Immature Granulocytes: 1 %
Lymphocytes Relative: 13 %
Lymphs Abs: 1.6 10*3/uL (ref 0.7–4.0)
MCH: 30.9 pg (ref 26.0–34.0)
MCHC: 32.9 g/dL (ref 30.0–36.0)
MCV: 93.8 fL (ref 80.0–100.0)
Monocytes Absolute: 1.5 10*3/uL — ABNORMAL HIGH (ref 0.1–1.0)
Monocytes Relative: 12 %
Neutro Abs: 9.4 10*3/uL — ABNORMAL HIGH (ref 1.7–7.7)
Neutrophils Relative %: 74 %
Platelet Count: 330 10*3/uL (ref 150–400)
RBC: 4.05 MIL/uL — ABNORMAL LOW (ref 4.22–5.81)
RDW: 14.6 % (ref 11.5–15.5)
WBC Count: 12.8 10*3/uL — ABNORMAL HIGH (ref 4.0–10.5)
nRBC: 0 % (ref 0.0–0.2)

## 2020-06-20 LAB — CBC WITH DIFFERENTIAL/PLATELET
Abs Immature Granulocytes: 0.17 10*3/uL — ABNORMAL HIGH (ref 0.00–0.07)
Basophils Absolute: 0 10*3/uL (ref 0.0–0.1)
Basophils Relative: 0 %
Eosinophils Absolute: 0 10*3/uL (ref 0.0–0.5)
Eosinophils Relative: 0 %
HCT: 39.8 % (ref 39.0–52.0)
Hemoglobin: 12.9 g/dL — ABNORMAL LOW (ref 13.0–17.0)
Immature Granulocytes: 1 %
Lymphocytes Relative: 11 %
Lymphs Abs: 1.4 10*3/uL (ref 0.7–4.0)
MCH: 31.1 pg (ref 26.0–34.0)
MCHC: 32.4 g/dL (ref 30.0–36.0)
MCV: 95.9 fL (ref 80.0–100.0)
Monocytes Absolute: 1.3 10*3/uL — ABNORMAL HIGH (ref 0.1–1.0)
Monocytes Relative: 11 %
Neutro Abs: 9.3 10*3/uL — ABNORMAL HIGH (ref 1.7–7.7)
Neutrophils Relative %: 77 %
Platelets: 339 10*3/uL (ref 150–400)
RBC: 4.15 MIL/uL — ABNORMAL LOW (ref 4.22–5.81)
RDW: 14.7 % (ref 11.5–15.5)
WBC: 12.2 10*3/uL — ABNORMAL HIGH (ref 4.0–10.5)
nRBC: 0 % (ref 0.0–0.2)

## 2020-06-20 LAB — PROTIME-INR
INR: 0.9 (ref 0.8–1.2)
Prothrombin Time: 12.3 seconds (ref 11.4–15.2)

## 2020-06-20 LAB — MAGNESIUM: Magnesium: 1.9 mg/dL (ref 1.7–2.4)

## 2020-06-20 LAB — BASIC METABOLIC PANEL - CANCER CENTER ONLY
Anion gap: 12 (ref 5–15)
BUN: 31 mg/dL — ABNORMAL HIGH (ref 6–20)
CO2: 28 mmol/L (ref 22–32)
Calcium: 9.6 mg/dL (ref 8.9–10.3)
Chloride: 96 mmol/L — ABNORMAL LOW (ref 98–111)
Creatinine: 0.54 mg/dL — ABNORMAL LOW (ref 0.61–1.24)
GFR, Estimated: >60 mL/min (ref >60)
Glucose, Bld: 80 mg/dL (ref 70–99)
Potassium: 4.6 mmol/L (ref 3.5–5.1)
Sodium: 136 mmol/L (ref 135–145)

## 2020-06-20 MED ORDER — MIDAZOLAM HCL 2 MG/2ML IJ SOLN
INTRAMUSCULAR | Status: AC
  Filled 2020-06-20: qty 4

## 2020-06-20 MED ORDER — LIDOCAINE HCL (PF) 1 % IJ SOLN
INTRAMUSCULAR | Status: AC | PRN
  Administered 2020-06-20: 10 mL

## 2020-06-20 MED ORDER — FENTANYL CITRATE (PF) 100 MCG/2ML IJ SOLN
INTRAMUSCULAR | Status: AC | PRN
  Administered 2020-06-20 (×2): 50 ug via INTRAVENOUS

## 2020-06-20 MED ORDER — LIDOCAINE HCL 1 % IJ SOLN
INTRAMUSCULAR | Status: AC
  Filled 2020-06-20: qty 20

## 2020-06-20 MED ORDER — SODIUM CHLORIDE 0.9 % IV SOLN: INTRAVENOUS | Status: DC

## 2020-06-20 MED ORDER — LIDOCAINE-PRILOCAINE 2.5-2.5 % EX CREA
1.0000 "application " | TOPICAL_CREAM | Freq: Once | CUTANEOUS | 0 refills | Status: AC
  Filled 2020-06-20: qty 30, 30d supply, fill #0

## 2020-06-20 MED ORDER — APIXABAN 5 MG PO TABS
10.0000 mg | ORAL_TABLET | Freq: Two times a day (BID) | ORAL | 0 refills | Status: AC
  Filled 2020-06-20: qty 74, 30d supply, fill #0

## 2020-06-20 MED ORDER — LIDOCAINE-EPINEPHRINE 1 %-1:100000 IJ SOLN
INTRAMUSCULAR | Status: AC
  Filled 2020-06-20: qty 1

## 2020-06-20 MED ORDER — HEPARIN SOD (PORK) LOCK FLUSH 100 UNIT/ML IV SOLN
INTRAVENOUS | Status: AC
  Filled 2020-06-20: qty 5

## 2020-06-20 MED ORDER — HEPARIN SOD (PORK) LOCK FLUSH 100 UNIT/ML IV SOLN
INTRAVENOUS | Status: AC | PRN
Start: 2020-06-20 — End: 2020-06-20
  Administered 2020-06-20: 500 [IU] via INTRAVENOUS
  Administered 2020-06-20: 250 [IU] via INTRAVENOUS

## 2020-06-20 MED ORDER — MIDAZOLAM HCL 2 MG/2ML IJ SOLN
INTRAMUSCULAR | Status: AC | PRN
  Administered 2020-06-20 (×2): 1 mg via INTRAVENOUS

## 2020-06-20 MED ORDER — FENTANYL CITRATE (PF) 100 MCG/2ML IJ SOLN
INTRAMUSCULAR | Status: AC
  Filled 2020-06-20: qty 2

## 2020-06-20 NOTE — Progress Notes (Signed)
Department of Dental Medicine     POSTOPERATIVE VISIT  Service Date:   06/20/2020  Patient Name:   Mario Peters Date of Birth:   06-03-1965 Medical Record Number: [id number]  TODAY'S VISIT    Assessment:   . The patient continues to heal well and consistent with dental procedures performed.   Plan:  . Follow-up as needed.  PROGRESS NOTE:   COVID-19 SCREENING:  The patient denies symptoms concerning for COVID-19 infection including fever, chills, cough, or newly developed shortness of breath.   HISTORY OF PRESENT ILLNESS . Mario Peters presents today for a second postoperative visit s/p full mouth extractions in the operating room. . Medical and dental history reviewed with the patient.  He has started chemoradiation therapy.   CHIEF COMPLAINT:   . Patient reports he is still having throbbing pain in the upper right quadrant.   Patient Active Problem List   Diagnosis Date Noted  . Caries   . Chronic periodontitis   . Retained dental root   . Chronic apical periodontitis   . Head and neck cancer ([place])   . Tracheostomy dependence ([place])   . Dysphagia   . Tracheostomy care ([place]) 05/15/2020  . 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 Laryngeal cancer ([place])   . Protein-calorie malnutrition, severe 04/20/2020  . Hypophosphatemia 04/19/2020  . Hypomagnesemia 04/19/2020  . AKI (acute kidney injury) ([place]) 04/18/2020  . Dehydration 04/18/2020  . CAP (community acquired pneumonia) 04/18/2020  . 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 Hyponatremia 04/18/2020  . Weight loss, unintentional 04/18/2020  . Hypokalemia 04/18/2020  . Malnutrition ([place]) 04/18/2020  . Tobacco abuse 04/18/2020  . Sepsis ([place]) 04/17/2020   Past Medical History:  Diagnosis Date  . Cancer ([place])   . Seizures ([place])    Past Surgical History:  Procedure Laterality Date  . DIRECT LARYNGOSCOPY N/A 04/22/2020   Procedure: DIRECT LARYNGOSCOPY WITH BIOPSY;  Surgeon: Mario Gala, MD;  Location: [facility];   Service: ENT;  Laterality: N/A;  . ESOPHAGOSCOPY  04/22/2020   Procedure: ESOPHAGOSCOPY;  Surgeon: Mario Gala, MD;  Location: [place];  Service: ENT;;  . IR GASTROSTOMY TUBE MOD SED  04/24/2020  . MULTIPLE EXTRACTIONS WITH ALVEOLOPLASTY N/A 05/30/2020   Procedure: MULTIPLE EXTRACTION WITH ALVEOLOPLASTY;  Surgeon: Mario Peters, DMD;  Location: [facility];  Service: Dentistry;  Laterality: N/A;  . TRACHEOSTOMY TUBE PLACEMENT N/A 04/22/2020   Procedure: TRACHEOSTOMY;  Surgeon: Mario Gala, MD;  Location: [facility];  Service: ENT;  Laterality: N/A;   Current Outpatient Medications  Medication Sig Dispense Refill  . acetaminophen (TYLENOL) 325 MG tablet Take 2 tablets (650 mg total) by mouth every 6 (six) hours as needed for mild pain (or Fever >/= 101).    . chlorhexidine (PERIDEX) 0.12 % solution Rinse mouth with 15 mLs 2 (two) times daily for 14 days. Do not swallow. 473 mL 0  . dexamethasone (DECADRON) 4 MG tablet Take 2 tablets (8 mg total) by mouth daily. Take daily x 3 days starting the day after cisplatin chemotherapy. Take with food. 30 tablet 1  . LORazepam (ATIVAN) 0.5 MG tablet Take 1 tablet (0.5 mg total) by mouth every 12 (twelve) hours as needed for anxiety. 30 tablet 0  . nicotine (NICODERM CQ - DOSED IN MG/24 HOURS) 21 mg/24hr patch Place 1 patch (21 mg total) onto the skin daily. 28 patch 0  . Nutritional Supplements (FEEDING SUPPLEMENT, OSMOLITE 1.5 CAL,) LIQD Place 474 mLs into feeding tube 4 (four) times daily.  0  . ondansetron (ZOFRAN) 8 MG tablet Take 1 tablet (8 mg total) by mouth 2 (two) times daily as needed. Start on the third day after cisplatin chemotherapy. 30 tablet 1  . prochlorperazine (COMPAZINE) 10 MG tablet Take 1 tablet (10 mg total) by mouth every 6 (six) hours as needed (Nausea or vomiting). 30 tablet 1   No current facility-administered medications for this visit.   No Known Allergies  LABS: Lab Results  Component Value Date   WBC 12.8 (H) 06/20/2020   HGB 12.5  (L) 06/20/2020   HCT 38.0 (L) 06/20/2020   MCV 93.8 06/20/2020   PLT 330 06/20/2020   BMET    Component Value Date/Time   NA 132 (L) 06/12/2020 1346   K 4.5 06/12/2020 1346   CL 96 (L) 06/12/2020 1346   CO2 26 06/12/2020 1346   GLUCOSE 90 06/12/2020 1346   BUN 11 06/12/2020 1346   CREATININE 0.54 (L) 06/12/2020 1346   CALCIUM 9.5 06/12/2020 1346   GFRNONAA >60 06/12/2020 1346    Lab Results  Component Value Date   INR 1.4 (H) 04/17/2020   No results found for: PTT   VITALS: There were no vitals taken for this visit.   EXAM: . Extraction sites appear to be healing WNL.  No signs of wound dehiscence or infection evident upon examination.  No sutures remain in-tact.  He appears to be healing well and extraction sites look much better in the upper right quadrant than previously.     ASSESSMENT:   . Postoperative course is consistent with dental procedures performed.   PLAN AND RECOMMENDATIONS: . Follow-up as needed.  Schedule an appointment once completed with radiation therapy.    . Establish care at an outside dental office for routine dental care including replacement of missing teeth as needed, cleanings/periodontal therapy and exams.   . Call if any questions or concerns arise.  o All questions and concerns were invited and addressed.  The patient tolerated today's visit well and departed in stable condition.  [place] Mario Peters, D.M.D.

## 2020-06-20 NOTE — Progress Notes (Signed)
Patient returns to room from IR. Dr Anselm Pancoast is over to inform family and staff that [facility] was not inserted. PICC line was inserted right arm. Vascular is at patient's bedside to do vascular studies of upper extremities.

## 2020-06-20 NOTE — Procedures (Signed)
Interventional Radiology Procedure:   Indications: Laryngeal cancer  Procedure:  Right arm PICC line placement  Findings: Compression of bilateral internal jugular veins with a small amount of thrombus in bilateral internal jugular veins.  Discussed with Dr. Chryl Heck.  Decided to place right arm PICC.  Right brachial PICC, single lumen, CT-injectable, tip at SVC/RA junction, length 34 cm.  Complications: None     EBL: less than 5 ml  Plan: Ordered venous duplex to document the IJ thrombus.     Mario Causby R. Anselm Pancoast, MD  Pager: [phone]

## 2020-06-20 NOTE — Assessment & Plan Note (Signed)
He continues to smoke.  We have discussed about smoking cessation in the past, he is currently not motivated to quit.

## 2020-06-20 NOTE — Discharge Instructions (Signed)
Interventional radiology phone numbers [phone] After hours [phone]    You have skin glue (dermabond) over your new port. Do not use the lidocaine cream (EMLA cream) over the skin glue until it has healed. The petroleum in the lidocaine cream will dissolve the skin glue resulting in an infection of your new port. Use ice in a zip lock bag for 1-2 minutes over your new port before the cancer center nurses access your port.    PICC Insertion, Care After This sheet gives you information about how to care for yourself after your procedure. Your health care provider may also give you more specific instructions. If you have problems or questions, contact your health care provider. What can I expect after the procedure? After your procedure, it is common to have mild discomfort at the insertion site. Follow these instructions at home: Activity  Rest as told by your health care provider. Ask your health care provider when you can return to your normal activities.  If your PICC is near or at the bend of your elbow, avoid activity with repeated motion at the elbow.  Do not lift anything that is heavier than 10 lb (4.5 kg), or the limit that you are told, until your health care provider says that it is safe.  Avoid using a crutch with the arm on the same side as your PICC. You may need to use a walker.  Avoid any activity that requires great effort as told by your health care provider.   Bathing  Do not take baths, swim, or use a hot tub until your health care provider approves. Ask your health care provider if you can take showers. You may only be allowed to take sponge baths for bathing.  Keep the bandage (dressing) dry until your health care provider says it can be removed.   General instructions  Do not drive for 24 hours if you were given a medicine to help you relax (sedative).  Take over-the-counter and prescription medicines only as told by your health care provider.  Keep  all follow-up visits as told by your health care provider. This is important. Contact a health care provider if:  You have a fever or chills.  You have more redness, swelling, or pain around the insertion site.  You have fluid or blood coming from the insertion site.  Your insertion site feels warm to the touch.  You have pus or a bad smell coming from the insertion site.  Your vein feels hard and raised like a "cord" under the skin around the insertion site. Get help right away if:  You have swelling in the arm in which the PICC was inserted.  You have numbness or tingling in your fingers, hand, or arm.  You have a red streak going up your arm from where the PICC was inserted.  Your PICC cannot be flushed, is hard to flush, or leaks around the insertion site when flushed.  You have pain in your arm, ear, face, or teeth.  Your PICC is accidentally pulled all the way out. If this happens, cover the insertion site with a bandage or gauze dressing. Do not throw the PICC away. Your health care provider will need to check it.  Your PICC was tugged or pulled and has partially come out. Do not push the PICC back in.  You hear a "flushing" sound when the PICC is flushed.  You feel your heart racing or skipping beats.  There is a hole or tear  in the PICC.  You have pain in your chest.  You have shortness of breath or difficulty breathing. Summary  After your procedure, it is common to have mild discomfort at the insertion site.  Do not lift anything that is heavier than 10 lb (4.5 kg), or the limit that you are told, until your health care provider says that it is safe.  Flush the PICC as told by your health care provider. Let your health care provider know right away if the PICC is hard to flush or does not flush. Do not use force to flush the PICC.  Check your insertion site every day for signs of infection. This information is not intended to replace advice given to you by  your health care provider. Make sure you discuss any questions you have with your health care provider. Document Revised: 05/18/2019 Document Reviewed: 05/18/2019 Elsevier Patient Education  2021 [employer].    Moderate Conscious Sedation, Adult, Care After This sheet gives you information about how to care for yourself after your procedure. Your health care provider may also give you more specific instructions. If you have problems or questions, contact your health care provider. What can I expect after the procedure? After the procedure, it is common to have:  Sleepiness for several hours.  Impaired judgment for several hours.  Difficulty with balance.  Vomiting if you eat too soon. Follow these instructions at home: For the time period you were told by your health care provider:  Rest.  Do not participate in activities where you could fall or become injured.  Do not drive or use machinery.  Do not drink alcohol.  Do not take sleeping pills or medicines that cause drowsiness.  Do not make important decisions or sign legal documents.  Do not take care of children on your own.      Eating and drinking 1. Follow the diet recommended by your health care provider. 2. Drink enough fluid to keep your urine pale yellow. 3. If you vomit: ? Drink water, juice, or soup when you can drink without vomiting. ? Make sure you have little or no nausea before eating solid foods.   General instructions  Take over-the-counter and prescription medicines only as told by your health care provider.  Have a responsible adult stay with you for the time you are told. It is important to have someone help care for you until you are awake and alert.  Do not smoke.  Keep all follow-up visits as told by your health care provider. This is important. Contact a health care provider if:  You are still sleepy or having trouble with balance after 24 hours.  You feel light-headed.  You keep  feeling nauseous or you keep vomiting.  You develop a rash.  You have a fever.  You have redness or swelling around the IV site. Get help right away if:  You have trouble breathing.  You have new-onset confusion at home. Summary  After the procedure, it is common to feel sleepy, have impaired judgment, or feel nauseous if you eat too soon.  Rest after you get home. Know the things you should not do after the procedure.  Follow the diet recommended by your health care provider and drink enough fluid to keep your urine pale yellow.  Get help right away if you have trouble breathing or new-onset confusion at home. This information is not intended to replace advice given to you by your health care provider. Make sure you  discuss any questions you have with your health care provider. Document Revised: 05/05/2019 Document Reviewed: 12/01/2018 Elsevier Patient Education  2021 [employer].

## 2020-06-20 NOTE — Therapy (Signed)
[facility] [address] [place], [place], [address] Phone: [phone]   Fax:  [phone]  Speech Language Pathology Evaluation  Patient Details  Name: Mario Peters MRN: [id number] Date of Birth: 11-28-1965 Referring Provider (SLP): Eppie Gibson, MD   Encounter Date: 06/20/2020   End of Session - 06/20/20 1611    Visit Number 1    Number of Visits 4    Date for SLP Re-Evaluation 09/18/20    SLP Start Time 0845    SLP Stop Time  0928    SLP Time Calculation (min) 43 min    Activity Tolerance Patient tolerated treatment well           Past Medical History:  Diagnosis Date  . Cancer ([place])   . Seizures ([place])     Past Surgical History:  Procedure Laterality Date  . DIRECT LARYNGOSCOPY N/A 04/22/2020   Procedure: DIRECT LARYNGOSCOPY WITH BIOPSY;  Surgeon: [name], MD;  Location: [facility];  Service: ENT;  Laterality: N/A;  . ESOPHAGOSCOPY  04/22/2020   Procedure: ESOPHAGOSCOPY;  Surgeon: [name], MD;  Location: [facility];  Service: ENT;;  . IR GASTROSTOMY TUBE MOD SED  04/24/2020  . MULTIPLE EXTRACTIONS WITH ALVEOLOPLASTY N/A 05/30/2020   Procedure: MULTIPLE EXTRACTION WITH ALVEOLOPLASTY;  Surgeon: [name], DMD;  Location: [facility];  Service: Dentistry;  Laterality: N/A;  . TRACHEOSTOMY TUBE PLACEMENT N/A 04/22/2020   Procedure: TRACHEOSTOMY;  Surgeon: [name], MD;  Location: [facility];  Service: ENT;  Laterality: N/A;    There were no vitals filed for this visit.   Subjective Assessment - 06/20/20 0849    Subjective Pt with trach and PMSV but appears to be breath-stacking with speech with it placed.    Patient is accompained by: Family member    Currently in Pain? Yes    Pain Score 1     Pain Location Other (Comment)   jaw (surgical site)   Pain Orientation Right    Pain Descriptors / Indicators Sore    Pain Type Surgical pain              SLP Evaluation [facility] - 06/20/20 7096      SLP  Visit Information   SLP Received On 06/20/20    Referring Provider (SLP) Eppie Gibson, MD    Onset Date Winter 2021    Medical Diagnosis SCCA of larynx      Subjective   Patient/Family Stated Goal Maintain WNL swallowing      General Information   HPI Pt seen for outpatient MBS accompanied by his sister in early May 2022 recommending cont'd NPO. Received trach 04/22/20 and PEG 04/24/20. Trach in place. MBS 04/25/20 revealed severe aspiration and NPO recommended.      Prior Functional Status   Cognitive/Linguistic Baseline Within functional limits    Available Support Family    Education Pt indicated he does not read or write      Cognition   Overall Cognitive Status Within Functional Limits for tasks assessed      Auditory Comprehension   Overall Auditory Comprehension Appears within functional limits for tasks assessed      Verbal Expression   Overall Verbal Expression Appears within functional limits for tasks assessed      Oral Motor/Sensory Function   Overall Oral Motor/Sensory Function Appears within functional limits for tasks assessed      Motor Speech   Overall Motor Speech Appears within functional limits for tasks assessed  Respiration --   with trach   Phonation --   aphonic - PMSV, but pt does not use due to breath stacking   Intelligibility Intelligibility reduced    Phrase --   pt exaggerates articulation which slightly improves intelligibility         Pt with modified (MBSS) 24 May 2020 stating: "Pt continues to exhibit severe pharyngeal dysphagia marked by decreased anterior hyoid excursion, laryngeal elevation and incompetent epiglottic deflection in setting of laryngeal mass. MBS performed without PMV in place due to air trapping/back pressure noted after donned for 1 minute and pt does not wear valve at home except periods of phonation. Aspiration prior to the swallow of thin and duirng with significant vallecular and pyriform sinus residue. Verbal cues for  multiple swallows and additional thin did not clear. Valve was placed briefly to provide additional pressure for cough. He was able to cough and expectorate pharyngeal residue into oral cavity. Pt to continue recommendation of NPO, use G-tube for nourishment and frequent oral care. Depending on plan of treatment, he will need follow up for dysphagia."  Because data states the risk for worsening dysphagia during and after radiation treatment is high due to undergoing radiation tx. Among other modifications for days when pt cannot functionally swallow, SLP encouraged pt to have pain meds prior to HEP completion.  SLP educated pt re: changes to swallowing musculature after rad tx, and why adherence to dysphagia HEP provided today and PO consumption was necessary to inhibit muscle fibrosis following rad tx. Pt demonstrated understanding of these things to SLP.    SLP then developed a HEP for pt and pt was instructed how to perform exercises involving lingual, vocal, and pharyngeal strengthening. SLP performed each exercise and pt return demonstrated each exercise. Special care was taken by SLP to ensure pt success as pt's literacy is reduced/limited. Pt was instructed to complete this program 2 times a day, 6-7 days/week until 6 months after his last rad tx, then x2 a week after that.                  SLP Education - 06/20/20 1608    Education Details HEP procedure, rationale for HEP, late effects head/neck radiation on swallowing function    Person(s) Educated Patient;Caregiver(s)    Methods Explanation;Demonstration;Verbal cues;Handout    Comprehension Verbalized understanding;Returned demonstration;Verbal cues required;Need further instruction            SLP Short Term Goals - 06/20/20 1618      SLP SHORT TERM GOAL #1   Title pt will complete HEP with occasional min A    Time 1    Period --   visits, for all STGs   Status New      SLP SHORT TERM GOAL #2   Title pt will tell  SLP why pt is completing HEP with modified independence    Time 1    Status New      SLP SHORT TERM GOAL #3   Title pt will describe 2 overt s/s aspiration PNA with modified independence    Time 2    Status New      SLP SHORT TERM GOAL #4   Title pt will tell SLP how a food journal could hasten return to a more normalized diet    Time 2    Status New            SLP Long Term Goals - 06/20/20 1620      SLP  LONG TERM GOAL #1   Title pt will complete HEP with rare min A    Time 4    Period --   visits, for all LTGS   Status New      SLP LONG TERM GOAL #2   Title pt will describe how to modify HEP over time, and the timeline associated with reduction in HEP frequency with modified independence over two sessions    Time 4    Status New      SLP LONG TERM GOAL #3   Title pt will tell SLP rationale for HEP in 3 sessions with rare min A    Time 4    Status New            Plan - 06/20/20 1612    Clinical Impression Statement At this time pt 's latest MBSS recommended NPO status. He had not performed oral care this AM, and pt was NPO status due to receiving port-a-cath insertion this PM. SLP designed an individualized HEP for dysphagia and pt completed each exercise on their own with initial consistent mod cues faded to modified independent. The pt's literacy status is low so SLP demonstrated for pt numerous times and had pt perform teachback numerous times for each exercise. Data indicate that pt's swallow ability will likely decrease over the course of radiation therapy and could very well decline over time following conclusion of their radiation therapy due to muscle disuse atrophy and/or muscle fibrosis. Pt will cont to need to be seen by SLP in order to assess the need for recommending any follow up objective swallow assessment, and ensuring pt correctly completes the individualized HEP.    Speech Therapy Frequency --   approx once every four weeks   Duration --   4 sessions  over 90 days   Treatment/Interventions Aspiration precaution training;Pharyngeal strengthening exercises;Diet toleration management by SLP;Trials of upgraded texture/liquids;Patient/family education;SLP instruction and feedback;Internal/external aids;Compensatory strategies    Potential to Achieve Goals Fair    Potential Considerations Severity of impairments    SLP Home Exercise Plan provided today    Consulted and Agree with Plan of Care Patient           Patient will benefit from skilled therapeutic intervention in order to improve the following deficits and impairments:   Dysphagia, pharyngeal phase    Problem List Patient Active Problem List   Diagnosis Date Noted  . Caries   . Chronic periodontitis   . Retained dental root   . Chronic apical periodontitis   . Head and neck cancer ([place])   . Tracheostomy dependence ([place])   . Dysphagia   . Tracheostomy care ([place]) 05/15/2020  . 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 Laryngeal cancer ([place])   . Protein-calorie malnutrition, severe 04/20/2020  . Hypophosphatemia 04/19/2020  . Hypomagnesemia 04/19/2020  . AKI (acute kidney injury) ([place]) 04/18/2020  . Dehydration 04/18/2020  . CAP (community acquired pneumonia) 04/18/2020  . 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 Hyponatremia 04/18/2020  . Weight loss, unintentional 04/18/2020  . Hypokalemia 04/18/2020  . Malnutrition ([place]) 04/18/2020  . Tobacco abuse 04/18/2020  . Sepsis ([place]) 04/17/2020    [facility] ,[place], [place]  06/20/2020, 4:22 PM  [facility] [address] [place], [place], [address] Phone: [phone]   Fax:  [phone]  Name: Mario Peters MRN: [id number] Date of Birth: 11-20-1965

## 2020-06-20 NOTE — Therapy (Signed)
[place], [place], [address] Phone: [phone]   Fax:  [phone]  Physical Therapy Evaluation  Patient Details  Name: Mario Peters MRN: [id number] Date of Birth: 02-26-1965 Referring Provider (PT): Reita May Date: 06/20/2020   PT End of Session - 06/20/20 1103    Visit Number 1    Number of Visits 2    Date for PT Re-Evaluation 09/12/20    PT Start Time 1025    PT Stop Time 1100    PT Time Calculation (min) 35 min    Activity Tolerance Patient tolerated treatment well    Behavior During Therapy [facility] for tasks assessed/performed           Past Medical History:  Diagnosis Date  . Cancer ([place])   . Seizures ([place])     Past Surgical History:  Procedure Laterality Date  . DIRECT LARYNGOSCOPY N/A 04/22/2020   Procedure: DIRECT LARYNGOSCOPY WITH BIOPSY;  Surgeon: [name], MD;  Location: [facility];  Service: ENT;  Laterality: N/A;  . ESOPHAGOSCOPY  04/22/2020   Procedure: ESOPHAGOSCOPY;  Surgeon: [name], MD;  Location: [facility];  Service: ENT;;  . IR GASTROSTOMY TUBE MOD SED  04/24/2020  . MULTIPLE EXTRACTIONS WITH ALVEOLOPLASTY N/A 05/30/2020   Procedure: MULTIPLE EXTRACTION WITH ALVEOLOPLASTY;  Surgeon: [name], DMD;  Location: [facility];  Service: Dentistry;  Laterality: N/A;  . TRACHEOSTOMY TUBE PLACEMENT N/A 04/22/2020   Procedure: TRACHEOSTOMY;  Surgeon: [name], MD;  Location: [facility];  Service: ENT;  Laterality: N/A;    There were no vitals filed for this visit.    Subjective Assessment - 06/20/20 1031    Subjective My mouth is hurting on the R side. I feel ok other than my mouth.    Pertinent History Squamous cell carcinoma of Larynx, p 16 -, presented to the ED on 04/07/20 with a four month history of painful swallowing, fatigue, weight loss and productive cough, 04/17/20 CT neck revealed left neck thickening that was difficult to separate from the unenhanced carotid and  internal jugular vessels, had tracheostomy, direct laryngoscopy with biopsy and esophagoscopy with biopsy was performed on 04/22/20, 04/22/20 Biopsy revealed invasive moderately to poorly differentiated squamous cell carcinoma.  p16 negative.  Upper cervical esophagus was also biopsied but was negative for dysplasia or malignancy, 05/22/20 PET revealed intense metabolic activity associated with the bilateral glottis and supraglottic tissue extending superiorly on the Right to the level of the vallecula, asymmetric hypermetabolic activity associated with the Left palatine tonsil region, bilateral hypermetabolic level II lymph nodes beneath the sternocleidomastoid muscles consistent with metastatic adenopathy, will receive 35 fractions to his Larynx and bilateral neck with weekly cisplatin ([place]). He started on 06/13/20 and will complete on 08/02/20, 04/24/20 PEG placement while in the hospital, [facility] placement today 6/2 in the afternoon, 05/30/20 16 teeth extracted by Dr. Jerilynn Mages. [place]    Patient Stated Goals to gain info from provider    Currently in Pain? Yes    Pain Score 1     Pain Location --   mouth   Pain Orientation Right    Pain Descriptors / Indicators Sore    Pain Type Surgical pain    Pain Onset 1 to 4 weeks ago    Pain Frequency Constant    Aggravating Factors  anything in his mouth - drink or candy    Pain Relieving Factors oragel    Effect of Pain on Daily Activities doesn't feel  like doing anything              [facility] PT Assessment - 06/20/20 0001      Assessment   Medical Diagnosis SCC of larynx    Referring Provider (PT) Isidore Moos    Onset Date/Surgical Date 04/22/20    Hand Dominance Right    Prior Therapy none      Precautions   Precautions None    Precaution Comments active cancer      Restrictions   Weight Bearing Restrictions No      Balance Screen   Has the patient fallen in the past 6 months Yes    How many times? 1   fell March 30th - had double pneumonia   Has the patient  had a decrease in activity level because of a fear of falling?  No    Is the patient reluctant to leave their home because of a fear of falling?  No      Home Environment   Living Environment Private residence    Living Arrangements Other relatives   Sister- [name]   Available Help at Discharge Family    Type of [place]      Prior Function   Level of Independence Independent   needs assist with feeding tube and trach suction   Vocation Unemployed    Leisure walks daily outside for long periods of time      Cognition   Overall Cognitive Status Within Functional Limits for tasks assessed      Functional Tests   Functional tests Sit to Stand      Sit to Stand   Comments 30 sec sit to stand: 9 reps which is poor for his age      Posture/Postural Control   Posture/Postural Control Postural limitations    Postural Limitations Rounded Shoulders;Forward head;Increased thoracic kyphosis      ROM / Strength   AROM / PROM / Strength AROM      AROM   Overall AROM Comments pt's daughter report that pt is an alcoholic and has had numerous falls resulting in broken bones - L shoulder ROM is limited to approx shoulder height due to previous break that was not repaired due to history of alcoholism    AROM Assessment Site Cervical    Cervical Flexion WFL    Cervical Extension 75% limited    Cervical - Right Side Bend 50% limited    Cervical - Left Side Bend 50% limited    Cervical - Right Rotation 75% limited   limited from the tumor   Cervical - Left Rotation [facility]      Ambulation/Gait   Ambulation/Gait Yes    Ambulation/Gait Assistance 7: Independent    Ambulation Distance (Feet) 10 Feet    Gait Pattern Decreased arm swing - left   due to previous injury            LYMPHEDEMA/ONCOLOGY QUESTIONNAIRE - 06/20/20 0001      Lymphedema Assessments   Lymphedema Assessments Head and Neck   not measured due to trach                  Objective measurements completed on  examination: See above findings.               PT Education - 06/20/20 1103    Education Details Neck ROM, importance of posture when sitting, standing and lying down, deep breathing, walking program and importance of staying active throughout treatment, CURE article  on staying active, "Why exercise?" flyer, lymphedema and PT info    Person(s) Educated Patient;Other (comment)    Methods Explanation;Handout    Comprehension Verbalized understanding               PT Long Term Goals - 06/20/20 1105      PT LONG TERM GOAL #1   Title Pt will return to baseline cervical ROM and not demonstrate any signs or symptoms of lymphedema.    Time 12    Period Weeks    Status New    Target Date 09/12/20              Head and Neck Clinic Goals - 06/20/20 1105      Patient will be able to verbalize understanding of a home exercise program for cervical range of motion, posture, and walking.    Time 1    Period Days    Status Achieved      Patient will be able to verbalize understanding of proper sitting and standing posture.    Time 1    Period Days    Status Achieved      Patient will be able to verbalize understanding of lymphedema risk and availability of treatment for this condition.    Time 1    Period Days    Status Achieved              Plan - 06/20/20 1147    Clinical Impression Statement Pt reports to PT with recently diagnosed squamous cell carcinoma of larynx. Pt has limitations with R rotation (due to tumor) and extension due to trach. Pt is very active and walks often throughout the day. Pt is currently receiving chemo and radiation to larynx and bilateral neck. His left shoulder ROM is very limited due to previous fractures from falls related to alcohol abuse (per sister). She reports that pt was not a candidate for surgery due to hx of alcohol abuse.  Educated pt about signs and symptoms of lymphedema as well as anatomy and physiology of  lymphatic system. Educated pt in importance of staying as active as possible throughout treatment to decrease fatigue as well as head and neck ROM exercises to decrease loss of ROM. Will see pt after completion of radiation to reassess ROM and assess for lymphedema to determine therapy needs at that time.    Examination-Activity Limitations Reach Overhead    Stability/Clinical Decision Making Stable/Uncomplicated    Clinical Decision Making Low    PT Frequency --   eval and 1 f/u visit   PT Duration 12 weeks    PT Treatment/Interventions ADLs/Self Care Home Management;Patient/family education;Therapeutic exercise    PT Next Visit Plan reassess baselines    PT Home Exercise Plan head and neck ROM exercises    Consulted and Agree with Plan of Care Patient           Patient will benefit from skilled therapeutic intervention in order to improve the following deficits and impairments:  Pain,Postural dysfunction,Decreased knowledge of precautions  Visit Diagnosis: Abnormal posture  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 Caries   . Chronic periodontitis   . Retained dental root   . Chronic apical periodontitis   . 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 Tracheostomy dependence ([place])   . Dysphagia   . Tracheostomy care ([place]) 05/15/2020  . 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 Laryngeal cancer ([place])   .  Protein-calorie malnutrition, severe 04/20/2020  . Hypophosphatemia 04/19/2020  . Hypomagnesemia 04/19/2020  . AKI (acute kidney injury) ([place]) 04/18/2020  . Dehydration 04/18/2020  . CAP (community acquired pneumonia) 04/18/2020  . 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 Hyponatremia 04/18/2020  . Weight loss, unintentional 04/18/2020  . Hypokalemia 04/18/2020  . Malnutrition ([place]) 04/18/2020  . Tobacco abuse 04/18/2020  . Sepsis [facility]) 04/17/2020    Allyson Sabal [facility] 06/20/2020, 11:53 AM  [place], [place], [address] Phone: [phone]   Fax:  [phone]  Name: Mario Peters MRN: [id number] Date of Birth: Jan 21, 1965  Manus Gunning, PT 06/20/20 11:53 AM

## 2020-06-20 NOTE — H&P (Signed)
Chief Complaint: Patient was seen in consultation today for laryngeal cancer/Port-a-cath placement.  Referring Physician(s): Iruku,Praveena (oncology)  Supervising Physician: Markus Daft  Patient Status: [facility] - Out-pt  History of Present Illness: Mario Peters is a 55 y.o. male with a past medical history of seizures, laryngeal cancer, and alcohol/tobacco abuse. 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 His cancer is managed by Dr. Chryl Heck. He underwent percutaneous gastrostomy tube placement in IR 04/24/2020. He is currently undergoing radiation therapy as management and has tentative plans to begin systemic chemotherapy as well- he is in need of durable IV access for administration of this.  IR consulted by Dr. Chryl Heck for possible image-guided Port-a-cath placement. Patient awake and alert sitting on stretcher, tracheostomy in place- no speech valve but nods appropriately to yes/no questions. Sister at bedside. Complains of throat pain, chronic and stable. Denies fever, chills, chest pain, dyspnea, abdominal pain, or headache.   Past Medical History:  Diagnosis Date  . Cancer ([place])   . Seizures ([place])     Past Surgical History:  Procedure Laterality Date  . DIRECT LARYNGOSCOPY N/A 04/22/2020   Procedure: DIRECT LARYNGOSCOPY WITH BIOPSY;  Surgeon: [name], MD;  Location: [place];  Service: ENT;  Laterality: N/A;  . ESOPHAGOSCOPY  04/22/2020   Procedure: ESOPHAGOSCOPY;  Surgeon: [name], MD;  Location: [place];  Service: ENT;;  . IR GASTROSTOMY TUBE MOD SED  04/24/2020  . MULTIPLE EXTRACTIONS WITH ALVEOLOPLASTY N/A 05/30/2020   Procedure: MULTIPLE EXTRACTION WITH ALVEOLOPLASTY;  Surgeon: [name], DMD;  Location: [place];  Service: Dentistry;  Laterality: N/A;  . TRACHEOSTOMY TUBE PLACEMENT N/A 04/22/2020   Procedure: TRACHEOSTOMY;  Surgeon: [name], MD;  Location: [place];  Service: ENT;  Laterality: N/A;     Allergies: Patient has no allergy information on record.  Medications: Prior to Admission medications   Medication Sig Start Date End Date Taking? Authorizing Provider  acetaminophen (TYLENOL) 325 MG tablet Take 2 tablets (650 mg total) by mouth every 6 (six) hours as needed for mild pain (or Fever >/= 101). 05/02/20   Barb Merino, MD  chlorhexidine (PERIDEX) 0.12 % solution Rinse mouth with 15 mLs 2 (two) times daily for 14 days. Do not swallow. 06/07/20 06/23/20  Sandi Mariscal B, DMD  dexamethasone (DECADRON) 4 MG tablet Take 2 tablets (8 mg total) by mouth daily. Take daily x 3 days starting the day after cisplatin chemotherapy. Take with food. 06/05/20   Benay Pike, MD  LORazepam (ATIVAN) 0.5 MG tablet Take 1 tablet (0.5 mg total) by mouth every 12 (twelve) hours as needed for anxiety. 05/27/20   Benay Pike, MD  nicotine (NICODERM CQ - DOSED IN MG/24 HOURS) 21 mg/24hr patch Place 1 patch (21 mg total) onto the skin daily. 05/02/20   Barb Merino, MD  Nutritional Supplements (FEEDING SUPPLEMENT, OSMOLITE 1.5 CAL,) LIQD Place 474 mLs into feeding tube 4 (four) times daily. 05/02/20   Barb Merino, MD  ondansetron (ZOFRAN) 8 MG tablet Take 1 tablet (8 mg total) by mouth 2 (two) times daily as needed. Start on the third day after cisplatin chemotherapy. 06/05/20   Benay Pike, MD  prochlorperazine (COMPAZINE) 10 MG tablet Take 1 tablet (10 mg total) by mouth every 6 (six) hours as needed (Nausea or vomiting). 06/05/20   Benay Pike, MD     Family History  Problem Relation Age of Onset  . Hypertension Other     Social History   Socioeconomic  History  . Marital status: Married    Spouse name: Not on file  . Number of children: Not on file  . Years of education: Not on file  . Highest education level: Not on file  Occupational History  . Not on file  Tobacco Use  . Smoking status: Former Smoker    Packs/day: 2.00  . Smokeless tobacco: Never Used  Vaping Use  .  Vaping Use: Never used  Substance and Sexual Activity  . 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 Drug use: Not Currently    Types: Marijuana  . Sexual activity: Not on file  Other Topics Concern  . Not on file  Social History Narrative  . Not on file   Social Determinants of Health   Financial Resource Strain: Not on file  Food Insecurity: Not on file  Transportation Needs: Not on file  Physical Activity: Not on file  Stress: Not on file  Social Connections: Not on file     Review of Systems: A 12 point ROS discussed and pertinent positives are indicated in the HPI above.  All other systems are negative.  Review of Systems  Constitutional: Negative for chills and fever.  HENT:       Positive for throat pain.  Respiratory: Negative for shortness of breath and wheezing.   Cardiovascular: Negative for chest pain and palpitations.  Gastrointestinal: Negative for abdominal pain.  Neurological: Negative for headaches.    Vital Signs: BP (!) 144/65   Pulse (!) 54   Temp 98.4 F (36.9 C) (Oral)   Resp 20   SpO2 100%   Physical Exam Constitutional:      General: He is not in acute distress.    Comments: Tracheostomy.  Cardiovascular:     Rate and Rhythm: Normal rate and regular rhythm.     Heart sounds: Normal heart sounds. No murmur heard.   Pulmonary:     Effort: Pulmonary effort is normal. No respiratory distress.     Breath sounds: Normal breath sounds. No wheezing.     Comments: Tracheostomy. Skin:    General: Skin is warm and dry.  Neurological:     Mental Status: He is alert.     Comments: Tracheostomy in place, no speech valve but nods appropriately to yes/no questions.      MD Evaluation Airway: Other (comments) Airway comments: Tracheostomy. Heart: WNL Abdomen: WNL Chest/ Lungs: WNL ASA  Classification: 3 Mallampati/Airway Score: Three (Tracheostomy.)   Imaging: NM PET Image Initial (PI) Skull Base To  Thigh  Result Date: 05/23/2020 CLINICAL DATA:  Initial treatment strategy for head and neck carcinoma. Squamous cell carcinoma the LEFT glottis. Tracheostomy 05/02/2020. EXAM: NUCLEAR MEDICINE PET SKULL BASE TO THIGH TECHNIQUE: 5.7 mCi F-18 FDG was injected intravenously. Full-ring PET imaging was performed from the skull base to thigh after the radiotracer. CT data was obtained and used for attenuation correction and anatomic localization. Fasting blood glucose: 85 mg/dl COMPARISON:  Neck CT 04/19/2030 FINDINGS: Mediastinal blood pool activity: SUV max 1.74 Liver activity: SUV max 2.7 NECK: Intense metabolic activity in the glottic and supraglottic tissue with SUV max equal 11.5. Activity involves LEFT and RIGHT glottis. Activity extends superiorly on the RIGHT to the vallecula with SUV max equal 9.5. Additionally, there is asymmetric hypermetabolic activity associated with the LEFT palatine tonsil region with SUV max equal 6. 2 on image 22. Bilateral hypermetabolic level 2 lymph nodes beneath the sternocleidomastoid muscles nodes are difficult to  define on noncontrast exam but activity is intense with SUV max equal 6.3 on the LEFT (image 35) and with SUV max equal 10.2 on the RIGHT ( image 37). Incidental CT findings: Metabolic activity associated with tracheostomy tube tract consider inflammatory. CHEST: Precarinal node measures 10 mm (image 71/4 ) with SUV max equal 2.6 which is slightly above background blood pool activity. No hilar activity. Small 3 mm RIGHT upper lobe nodule (image 70/CT series 4) with no metabolic activity. Small subpleural nodule in the RIGHT lower lobe measures 2 mm image 81. Smudgy subpleural nodule RIGHT upper lobe measuring 3 mm image 64. There is mild ground-glass densities in the RIGHT lower lobe (image 94) which appear infectious. Near complete resolution of diffuse bilateral nodularity seen on CT 03/21/2020. Incidental CT findings: none ABDOMEN/PELVIS: No hypermetabolic activity in  the liver. No hypermetabolic abdominopelvic lymph nodes. Benign activity associated with the percutaneous gastrostomy tube. Incidental CT findings: none SKELETON: No focal hypermetabolic activity to suggest skeletal metastasis. Incidental CT findings: none IMPRESSION: 1. Intense metabolic activity associated with the bilateral glottis and supraglottic tissue extending superiorly on the RIGHT to the level of the vallecula. 2. Asymmetric hypermetabolic activity associated with the LEFT palatine tonsil region. 3. Bilateral hypermetabolic level II lymph nodes beneath the sternocleidomastoid muscles consistent with metastatic adenopathy. 4. Metabolic activity associated with the tracheostomy tube track is favored benign. 5. Small subcarinal lymph node with mild metabolic activity is favored benign. 6. Three small pulmonary nodules in the RIGHT lung are indeterminate. Extensive pulmonary nodularity on CT from 04/17/2020 as no clear resolved. Recommend attention on routine surveillance. Electronically Signed   By: Suzy Bouchard M.D.   On: 05/23/2020 08:59   DG Swallowing Func-Speech Pathology  Result Date: 05/24/2020 Objective Swallowing Evaluation: Type of Study: MBS-Modified Barium Swallow Study  Patient Details Name: Mario Peters MRN: [id number] Date of Birth: Nov 08, 1965 Today's Date: 05/24/2020 Time: SLP Start Time (ACUTE ONLY): 1136 -SLP Stop Time (ACUTE ONLY): 1202 SLP Time Calculation (min) (ACUTE ONLY): 26 min Past Medical History: Past Medical History: Diagnosis Date . Seizures ([place])  Past Surgical History: Past Surgical History: Procedure Laterality Date . DIRECT LARYNGOSCOPY N/A 04/22/2020  Procedure: DIRECT LARYNGOSCOPY WITH BIOPSY;  Surgeon: [name], MD;  Location: [place];  Service: ENT;  Laterality: N/A; . ESOPHAGOSCOPY  04/22/2020  Procedure: ESOPHAGOSCOPY;  Surgeon: [name], MD;  Location: [place];  Service: ENT;; . IR GASTROSTOMY TUBE MOD SED  04/24/2020 . TRACHEOSTOMY TUBE PLACEMENT N/A 04/22/2020   Procedure: TRACHEOSTOMY;  Surgeon: [name], MD;  Location: [place];  Service: ENT;  Laterality: N/A; HPI: Pt seen for outpatient MBS accompanied by his sister. PMH: EtOH abuse,  tobacco abuse, remote hx of seizures. Pt hospitalized 04/2018 and laryngeal neoplasm suspected and pathology confirmed squamous cell carcinoma. Received trach 4/4 and has G-tube.  Pt's sister reports pt has an appointment next week to decide on surgery/laryngectomy and/or radiation. Trach in place. MBS 04/25/20 revealed severe aspiration and NPO recommended. Per pt's sister, "a doctor recommended to start food/liquid" and pt has been eating and drinking for 8 days. He has been coughing with po's and MBS recommended today.  Subjective: -- (alert, talking with PMV placed intermittently) Assessment / Plan / Recommendation CHL IP CLINICAL IMPRESSIONS 05/24/2020 Clinical Impression Pt continues to exhibit severe pharyngeal dysphagia marked by decreased anterior hyoid excursion, laryngeal elevation and incompetent epiglottic deflection in setting of laryngeal mass. MBS performed without PMV in place due to air trapping/back pressure noted after donned for 1  minute and pt does not wear valve at home except periods of phonation. Aspiration prior to the swallow of thin and duirng with significant vallecular and pyriform sinus residue. Verbal cues for multiple swallows and additional thin did not clear. Valve was placed briefly to provide additional pressure for cough. He was able to cough and expectorate pharyngeal residue into oral cavity. Pt to continue recommendation of NPO, use G-tube for nourishment and frequent oral care. Depending on plan of treatment, he will need follow up for dysphagia. SLP Visit Diagnosis Dysphagia, pharyngeal phase (R13.13) Attention and concentration deficit following -- Frontal lobe and executive function deficit following -- Impact on safety and function Severe aspiration risk   CHL IP TREATMENT RECOMMENDATION 05/24/2020  Treatment Recommendations Defer treatment plan to f/u with SLP   No flowsheet data found. CHL IP DIET RECOMMENDATION 05/24/2020 SLP Diet Recommendations NPO Liquid Administration via -- Medication Administration Via alternative means Compensations -- Postural Changes --   CHL IP OTHER RECOMMENDATIONS 05/24/2020 Recommended Consults -- Oral Care Recommendations Oral care QID Other Recommendations --   CHL IP FOLLOW UP RECOMMENDATIONS 05/24/2020 Follow up Recommendations Outpatient SLP   CHL IP FREQUENCY AND DURATION 04/25/2020 Speech Therapy Frequency (ACUTE ONLY) min 2x/week Treatment Duration 2 weeks      CHL IP ORAL PHASE 05/24/2020 Oral Phase WFL Oral - Pudding Teaspoon -- Oral - Pudding Cup -- Oral - Honey Teaspoon -- Oral - Honey Cup -- Oral - Nectar Teaspoon -- Oral - Nectar Cup -- Oral - Nectar Straw -- Oral - Thin Teaspoon -- Oral - Thin Cup -- Oral - Thin Straw -- Oral - Puree -- Oral - Mech Soft -- Oral - Regular -- Oral - Multi-Consistency -- Oral - Pill -- Oral Phase - Comment --  CHL IP PHARYNGEAL PHASE 05/24/2020 Pharyngeal Phase Impaired Pharyngeal- Pudding Teaspoon -- Pharyngeal -- Pharyngeal- Pudding Cup -- Pharyngeal -- Pharyngeal- Honey Teaspoon -- Pharyngeal -- Pharyngeal- Honey Cup -- Pharyngeal -- Pharyngeal- Nectar Teaspoon -- Pharyngeal -- Pharyngeal- Nectar Cup NT Pharyngeal -- Pharyngeal- Nectar Straw -- Pharyngeal -- Pharyngeal- Thin Teaspoon -- Pharyngeal -- Pharyngeal- Thin Cup Penetration/Aspiration before swallow;Pharyngeal residue - valleculae;Pharyngeal residue - pyriform;Reduced epiglottic inversion;Reduced anterior laryngeal mobility;Reduced laryngeal elevation Pharyngeal Material enters airway, passes BELOW cords without attempt by patient to eject out (silent aspiration) Pharyngeal- Thin Straw -- Pharyngeal -- Pharyngeal- Puree NT Pharyngeal -- Pharyngeal- Mechanical Soft -- Pharyngeal -- Pharyngeal- Regular Pharyngeal residue - valleculae;Pharyngeal residue - pyriform;Reduced anterior  laryngeal mobility;Reduced laryngeal elevation Pharyngeal -- Pharyngeal- Multi-consistency -- Pharyngeal -- Pharyngeal- Pill -- Pharyngeal -- Pharyngeal Comment --  CHL IP CERVICAL ESOPHAGEAL PHASE 05/24/2020 Cervical Esophageal Phase WFL Pudding Teaspoon -- Pudding Cup -- Honey Teaspoon -- Honey Cup -- Nectar Teaspoon -- Nectar Cup -- Nectar Straw -- Thin Teaspoon -- Thin Cup -- Thin Straw -- Puree -- Mechanical Soft -- Regular -- Multi-consistency -- Pill -- Cervical Esophageal Comment -- Mario Peters 05/24/2020, 3:02 PM      Orbie Pyo Colvin Caroli.Ed CCC-SLP Speech-Language Pathologist Pager [phone] Office [phone]          Labs:  CBC: Recent Labs    05/01/20 1043 05/30/20 1310 06/12/20 1346 06/20/20 1109 06/20/20 1344  WBC 7.6  --  12.2* 12.8* 12.2*  HGB 14.4 9.5* 11.8* 12.5* 12.9*  HCT 44.3 28.0* 36.2* 38.0* 39.8  PLT 391  --  349 330 339    COAGS: Recent Labs    04/17/20 1936 04/17/20 2357  INR 1.4*  --   APTT  30 25    BMP: Recent Labs    04/29/20 0116 05/01/20 0524 05/30/20 1310 06/12/20 1346 06/20/20 1109  NA 135 130* 133* 132* 136  K 4.2 3.7 4.4 4.5 4.6  CL 98 93* 97* 96* 96*  CO2 28 31  --  26 28  GLUCOSE 162* 91 75 90 80  BUN 33* 21* 8 11 31*  CALCIUM 8.8* 9.0  --  9.5 9.6  CREATININE 0.50* 0.52* 0.30* 0.54* 0.54*  GFRNONAA >60 >60  --  >60 >60    LIVER FUNCTION TESTS: Recent Labs    04/21/20 0025 04/22/20 1539 04/27/20 0258 04/29/20 0116  BILITOT 0.8 1.1 0.9 0.5  AST 25 20 18 29   ALT 19 17 11 19   ALKPHOS 56 60 62 58  PROT 5.2* 5.4* 6.0* 6.5  ALBUMIN 1.8* 1.8* 2.2* 2.1*     Assessment and Plan:  Laryngeal cancer (SCC of left glottis) currently undergoing radiation therapy, with tentative plans to begin systemic chemotherapy as management, in need of durable IV access for administration. Plan for image-guided Port-a-cath placement today in IR. Patient is NPO. Afebrile.  Risks and benefits of image-guided Port-a-catheter placement  were discussed with the patient including, but not limited to bleeding, infection, pneumothorax, or fibrin sheath development and need for additional procedures. All of the patient's questions were answered, patient is agreeable to proceed. Consent signed and in chart.   Thank you for this interesting consult.  I greatly enjoyed meeting Mario Peters and look forward to participating in their care.  A copy of this report was sent to the requesting provider on this date.  Electronically Signed: Earley Abide, PA-C 06/20/2020, 2:09 PM   I spent a total of 25 Minutes in face to face in clinical consultation, greater than 50% of which was counseling/coordinating care for laryngeal cancer/Port-a-cath placement.

## 2020-06-20 NOTE — Progress Notes (Signed)
Oncology Nurse Navigator Documentation  Mr. Sherwin presented for Head and Neck MDC this morning with his sister [name]. They saw Garald Balding SLP and Allyson Sabal PT. Mr. Kehoe and [name] know to call me with any future questions or needs.   Harlow Asa RN, BSN, OCN Head & Neck Oncology Nurse [place] [facility] Phone # [phone]  Fax # [phone]

## 2020-06-20 NOTE — Patient Instructions (Signed)
SWALLOWING EXERCISES Do these until 6 months after your last day of radiation, then 2-3 times per week afterwards  1. Effortful Swallows - Press your tongue against the roof of your mouth for 3 seconds, then squeeze the muscles in your neck while you swallow your saliva or a sip of water - Repeat 10-15 times, 2-3 times a day, and use whenever you eat or drink  2. Masako Swallow - swallow with your tongue sticking out - Stick tongue out past your lips and gently bite tongue with your teeth - Swallow, while holding your tongue with your teeth - Repeat 10-15 times, 2-3 times a day *use a wet spoon if your mouth gets dry*  3. Pitch Raise - Repeat "he", once per second in as high of a pitch as you can - Repeat 20 times, 2-3 times a day  4. Shaker Exercise - head lift - Lie flat on your back in your bed or on a couch without pillows - Raise your head and look at your feet - KEEP YOUR SHOULDERS DOWN - HOLD FOR 45-60 SECONDS, then lower your head back down - Repeat 3 times, 2-3 times a day  5. Mendelsohn Maneuver - "half swallow" exercise - Start to swallow, and keep your Adam's apple up by squeezing hard with the muscles of the throat - Hold the squeeze for 5-7 seconds and then relax - Repeat 10-15 times, 2-3 times a day *use a wet spoon if your mouth gets dry*  (supraglottic/super-supraglottic was held due to pt trach dependent)

## 2020-06-20 NOTE — Progress Notes (Signed)
[place] CONSULT NOTE  Patient Care Team: Patient, [name] as PCP - General (General Practice) Malmfelt, Stephani Police, RN as Oncology Nurse Navigator Benay Pike, MD as Consulting Physician (Hematology and Oncology) Eppie Gibson, MD as Consulting Physician (Radiation Oncology) [name], MD as Consulting Physician (Otolaryngology)  CHIEF COMPLAINTS/PURPOSE OF CONSULTATION:   Laryngeal SCC, follow up prior to week 2 of cisplatin.  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 Review of systems with some dental pain at the site of extraction, otherwise no major complaints. Physical examination with remarkable improvement in cervical lymphadenopathy consistent with response. His labs have been reviewed and okay to proceed with planned weekly cycle 2 of cisplatin.  Port placement scheduled today for chemotherapy administration.   Weight loss, unintentional 3 to 4 pound weight loss since his last visit despite adequate G-tube nourishment according to family.  He is also scheduled to meet in nutritionist tomorrow.  We will continue to monitor.  Tobacco abuse He continues to smoke.  We have discussed about smoking cessation in the past, he is currently not motivated to quit.  No orders of the defined types were placed in this encounter.   HISTORY OF PRESENTING ILLNESS:   Mario Peters 55 y.o. male is here because of newly diagnosed laryngeal SCC  Oncology History Overview Note  [name] is a 55 year old male with a past medical history significant for alcohol tobacco abuse, and history of seizures.  The patient presented with severe dysphagia, cough, fatigue.  He has had significant weight loss recently and  hoarseness of his voice.  Symptoms present for approximately 4 months.  CT of the chest on admission showed diffuse bilateral pulmonary parenchymal changes consistent with multifocal pneumonia possible related to aspiration.  CT of the neck showed mildly hyperdense focus at the posterior aspect of the left vocal fold with associated soft tissue thickening which could be laryngeal neoplasm.  He was seen by ENT and flex fiberoptic lower discopathy was performed showing a large partially obstructing laryngeal tumor.  He was taken to the OR on 04/22/2020 underwent tracheostomy and biopsy.  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  P16 negative.   He is status post G-tube placement by IR on 04/24/2020.   He was initially seen in the hospital. Recommendation was to proceed with outpatient PET CT and return to follow up with medical oncology outpatient. PET CT without any clear evidence of metastatic disease.   Glottis carcinoma ([place])  05/08/2020 Initial Diagnosis   Glottis carcinoma ([place])   05/08/2020 Cancer Staging   Staging form: Larynx - Glottis, AJCC 8th Edition - Clinical stage from 05/08/2020: Stage IVA (cT3, cN2c, cM0) - Signed by Benay Pike, MD on 05/27/2020 Stage prefix: Initial diagnosis   06/13/2020 -  Chemotherapy    Patient is on Treatment Plan: HEAD/NECK CISPLATIN Q7D       C1D1 of weekly cisplatin on  06/13/2020  Interval History  Patient is here for a follow-up with his sister. He is doing well overall, tolerated first week of chemo and radiation well.  He just has some pain at the site of dental extraction and saw the dentist today.  He otherwise denies any nausea, vomiting.  He does  have runny stools for a couple weeks now.  He has been using some kind of as needed laxative.  Nourishment is primarily through G-tube, has been using 8 cans of Osmolite, lost 3 pounds since his last visit, has a follow-up with nutritionist tomorrow.  He is  instructed not to drink or eat by mouth.  No change in breathing.  According to his sister, his neck appears to be much better and his face is less swollen.  No other concerns today.  No change in hearing or urinary habits.  Rest of the pertinent review of systems reviewed and negative.  MEDICAL HISTORY:  Past Medical History:  Diagnosis Date  . Cancer ([place])   . Seizures ([place])     SURGICAL HISTORY: Past Surgical History:  Procedure Laterality Date  . DIRECT LARYNGOSCOPY N/A 04/22/2020   Procedure: DIRECT LARYNGOSCOPY WITH BIOPSY;  Surgeon: [name], MD;  Location: [place];  Service: ENT;  Laterality: N/A;  . ESOPHAGOSCOPY  04/22/2020   Procedure: ESOPHAGOSCOPY;  Surgeon: [name], MD;  Location: [place];  Service: ENT;;  . IR GASTROSTOMY TUBE MOD SED  04/24/2020  . MULTIPLE EXTRACTIONS WITH ALVEOLOPLASTY N/A 05/30/2020   Procedure: MULTIPLE EXTRACTION WITH ALVEOLOPLASTY;  Surgeon: [name], DMD;  Location: [place];  Service: Dentistry;  Laterality: N/A;  . TRACHEOSTOMY TUBE PLACEMENT N/A 04/22/2020   Procedure: TRACHEOSTOMY;  Surgeon: [name], MD;  Location: [place];  Service: ENT;  Laterality: N/A;    SOCIAL HISTORY: Social History   Socioeconomic History  . Marital status: Married    Spouse name: Not on file  . Number of children: Not on file  . Years of education: Not on file  . Highest education level: Not on file  Occupational History  . Not on file  Tobacco Use  . Smoking status: Former Smoker    Packs/day: 2.00  . Smokeless tobacco: Never Used  Vaping Use  . Vaping Use: Never used  Substance and Sexual Activity  . 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 Drug use: Not Currently    Types: Marijuana  . Sexual activity: Not on file  Other Topics Concern  . Not on file  Social History Narrative  . Not on file   Social Determinants of Health   Financial Resource Strain: Not on file  Food Insecurity: Not on file  Transportation  Needs: Not on file  Physical Activity: Not on file  Stress: Not on file  Social Connections: Not on file  Intimate Partner Violence: Not At Risk  . Fear of Current or Ex-Partner: No  . Emotionally Abused: No  . Physically Abused: No  . Sexually Abused: No    FAMILY HISTORY: Family History  Problem Relation Age of Onset  . Hypertension Other     ALLERGIES:  has no allergies on file.  MEDICATIONS:  Current Outpatient Medications  Medication Sig Dispense Refill  . acetaminophen (TYLENOL) 325 MG tablet Take 2 tablets (650 mg total) by mouth every 6 (six) hours as needed for mild pain (or Fever >/= 101).    . chlorhexidine (PERIDEX) 0.12 % solution Rinse mouth with 15 mLs 2 (two) times daily for 14 days. Do not swallow. 473 mL 0  . dexamethasone (DECADRON) 4 MG tablet Take 2 tablets (8 mg total) by mouth daily. Take daily x 3 days starting the day after cisplatin chemotherapy. Take with food. 30 tablet 1  . lidocaine-prilocaine (EMLA) cream Apply topically once  for 1 dose as directed 30 g 0  . LORazepam (ATIVAN) 0.5 MG tablet Take 1 tablet (0.5 mg total) by mouth every 12 (twelve) hours as needed for anxiety. 30 tablet 0  . Nutritional Supplements (FEEDING SUPPLEMENT, OSMOLITE 1.5 CAL,) LIQD Place 474 mLs into feeding tube 4 (four) times daily.  0  . ondansetron (ZOFRAN-ODT) 8 MG disintegrating tablet Take 8 mg by mouth 2 (two) times daily as needed.    . prochlorperazine (COMPAZINE) 10 MG tablet Take 1 tablet (10 mg total) by mouth every 6 (six) hours as needed (Nausea or vomiting). 30 tablet 1   No current facility-administered medications for this visit.   Facility-Administered Medications Ordered in Other Visits  Medication Dose Route Frequency Provider Last Rate Last Admin  . 0.9 %  sodium chloride infusion   Intravenous Continuous Allred, Darrell K, PA-C 50 mL/hr at 06/20/20 1353 New Bag at 06/20/20 1353    PHYSICAL EXAMINATION:  ECOG PERFORMANCE STATUS: 1 - Symptomatic but  completely ambulatory  Vitals:   06/20/20 1206  BP: (!) 127/53  Pulse: 64  Resp: 18  Temp: (!) 97.4 F (36.3 C)  SpO2: 100%   Filed Weights   06/20/20 1206  Weight: 118 lb 4.8 oz (53.7 kg)    Physical Exam Constitutional:      Appearance: Normal appearance.  HENT:     Head: Normocephalic and atraumatic.     Mouth/Throat:     Mouth: Mucous membranes are moist.     Comments: No mucositis. Neck:     Comments: Trach in place.  Cardiovascular:     Rate and Rhythm: Normal rate and regular rhythm.     Pulses: Normal pulses.     Heart sounds: Normal heart sounds.  Pulmonary:     Effort: Pulmonary effort is normal.     Breath sounds: Normal breath sounds.  Abdominal:     General: Abdomen is flat.     Palpations: Abdomen is soft.     Comments: G-tube in place  Musculoskeletal:        General: No swelling or tenderness.  Lymphadenopathy:     Cervical: Cervical adenopathy (Remarkable improvement since initiation of CRT, face and neck appear less swollen) present.  Skin:    General: Skin is warm and dry.  Neurological:     General: No focal deficit present.     Mental Status: He is alert.  Psychiatric:        Mood and Affect: Mood normal.        Behavior: Behavior normal.       LABORATORY DATA:  I have reviewed the data as listed Lab Results  Component Value Date   WBC 12.2 (H) 06/20/2020   HGB 12.9 (L) 06/20/2020   HCT 39.8 06/20/2020   MCV 95.9 06/20/2020   PLT 339 06/20/2020     Chemistry      Component Value Date/Time   NA 136 06/20/2020 1109   K 4.6 06/20/2020 1109   CL 96 (L) 06/20/2020 1109   CO2 28 06/20/2020 1109   BUN 31 (H) 06/20/2020 1109   CREATININE 0.54 (L) 06/20/2020 1109      Component Value Date/Time   CALCIUM 9.6 06/20/2020 1109   ALKPHOS 58 04/29/2020 0116   AST 29 04/29/2020 0116   ALT 19 04/29/2020 0116   BILITOT 0.5 04/29/2020 0116       RADIOGRAPHIC STUDIES: I have personally reviewed the radiological images as listed and  agreed with the findings in the  report. NM PET Image Initial (PI) Skull Base To Thigh  Result Date: 05/23/2020 CLINICAL DATA:  Initial treatment strategy for head and neck carcinoma. Squamous cell carcinoma the LEFT glottis. Tracheostomy 05/02/2020. EXAM: NUCLEAR MEDICINE PET SKULL BASE TO THIGH TECHNIQUE: 5.7 mCi F-18 FDG was injected intravenously. Full-ring PET imaging was performed from the skull base to thigh after the radiotracer. CT data was obtained and used for attenuation correction and anatomic localization. Fasting blood glucose: 85 mg/dl COMPARISON:  Neck CT 04/19/2030 FINDINGS: Mediastinal blood pool activity: SUV max 1.74 Liver activity: SUV max 2.7 NECK: Intense metabolic activity in the glottic and supraglottic tissue with SUV max equal 11.5. Activity involves LEFT and RIGHT glottis. Activity extends superiorly on the RIGHT to the vallecula with SUV max equal 9.5. Additionally, there is asymmetric hypermetabolic activity associated with the LEFT palatine tonsil region with SUV max equal 6. 2 on image 22. Bilateral hypermetabolic level 2 lymph nodes beneath the sternocleidomastoid muscles nodes are difficult to define on noncontrast exam but activity is intense with SUV max equal 6.3 on the LEFT (image 35) and with SUV max equal 10.2 on the RIGHT ( image 37). Incidental CT findings: Metabolic activity associated with tracheostomy tube tract consider inflammatory. CHEST: Precarinal node measures 10 mm (image 71/4 ) with SUV max equal 2.6 which is slightly above background blood pool activity. No hilar activity. Small 3 mm RIGHT upper lobe nodule (image 70/CT series 4) with no metabolic activity. Small subpleural nodule in the RIGHT lower lobe measures 2 mm image 81. Smudgy subpleural nodule RIGHT upper lobe measuring 3 mm image 64. There is mild ground-glass densities in the RIGHT lower lobe (image 94) which appear infectious. Near complete resolution of diffuse bilateral nodularity seen on CT  03/21/2020. Incidental CT findings: none ABDOMEN/PELVIS: No hypermetabolic activity in the liver. No hypermetabolic abdominopelvic lymph nodes. Benign activity associated with the percutaneous gastrostomy tube. Incidental CT findings: none SKELETON: No focal hypermetabolic activity to suggest skeletal metastasis. Incidental CT findings: none IMPRESSION: 1. Intense metabolic activity associated with the bilateral glottis and supraglottic tissue extending superiorly on the RIGHT to the level of the vallecula. 2. Asymmetric hypermetabolic activity associated with the LEFT palatine tonsil region. 3. Bilateral hypermetabolic level II lymph nodes beneath the sternocleidomastoid muscles consistent with metastatic adenopathy. 4. Metabolic activity associated with the tracheostomy tube track is favored benign. 5. Small subcarinal lymph node with mild metabolic activity is favored benign. 6. Three small pulmonary nodules in the RIGHT lung are indeterminate. Extensive pulmonary nodularity on CT from 04/17/2020 as no clear resolved. Recommend attention on routine surveillance. Electronically Signed   By: Suzy Bouchard M.D.   On: 05/23/2020 08:59   DG Swallowing Func-Speech Pathology  Result Date: 05/24/2020 Objective Swallowing Evaluation: Type of Study: MBS-Modified Barium Swallow Study  Patient Details Name: Mario Peters MRN: [id number] Date of Birth: [birth year] Today's Date: 05/24/2020 Time: SLP Start Time (ACUTE ONLY): 1136 -SLP Stop Time (ACUTE ONLY): 1202 SLP Time Calculation (min) (ACUTE ONLY): 26 min Past Medical History: Past Medical History: Diagnosis Date . Seizures ([place])  Past Surgical History: Past Surgical History: Procedure Laterality Date . DIRECT LARYNGOSCOPY N/A 04/22/2020  Procedure: DIRECT LARYNGOSCOPY WITH BIOPSY;  Surgeon: [name], MD;  Location: [place];  Service: ENT;  Laterality: N/A; . ESOPHAGOSCOPY  04/22/2020  Procedure: ESOPHAGOSCOPY;  Surgeon: [name], MD;  Location: [place];  Service: ENT;;  . IR GASTROSTOMY TUBE MOD SED  04/24/2020 . TRACHEOSTOMY TUBE PLACEMENT N/A 04/22/2020  Procedure: TRACHEOSTOMY;  Surgeon:  [name], MD;  Location: [facility] OR;  Service: ENT;  Laterality: N/A; HPI: Pt seen for outpatient MBS accompanied by his sister. PMH: EtOH abuse,  tobacco abuse, remote hx of seizures. Pt hospitalized 04/2018 and laryngeal neoplasm suspected and pathology confirmed squamous cell carcinoma. Received trach 4/4 and has G-tube.  Pt's sister reports pt has an appointment next week to decide on surgery/laryngectomy and/or radiation. Trach in place. MBS 04/25/20 revealed severe aspiration and NPO recommended. Per pt's sister, "a doctor recommended to start food/liquid" and pt has been eating and drinking for 8 days. He has been coughing with po's and MBS recommended today.  Subjective: -- (alert, talking with PMV placed intermittently) Assessment / Plan / Recommendation CHL IP CLINICAL IMPRESSIONS 05/24/2020 Clinical Impression Pt continues to exhibit severe pharyngeal dysphagia marked by decreased anterior hyoid excursion, laryngeal elevation and incompetent epiglottic deflection in setting of laryngeal mass. MBS performed without PMV in place due to air trapping/back pressure noted after donned for 1 minute and pt does not wear valve at home except periods of phonation. Aspiration prior to the swallow of thin and duirng with significant vallecular and pyriform sinus residue. Verbal cues for multiple swallows and additional thin did not clear. Valve was placed briefly to provide additional pressure for cough. He was able to cough and expectorate pharyngeal residue into oral cavity. Pt to continue recommendation of NPO, use G-tube for nourishment and frequent oral care. Depending on plan of treatment, he will need follow up for dysphagia. SLP Visit Diagnosis Dysphagia, pharyngeal phase (R13.13) Attention and concentration deficit following -- Frontal lobe and executive function deficit following -- Impact on  safety and function Severe aspiration risk   CHL IP TREATMENT RECOMMENDATION 05/24/2020 Treatment Recommendations Defer treatment plan to f/u with SLP   No flowsheet data found. CHL IP DIET RECOMMENDATION 05/24/2020 SLP Diet Recommendations NPO Liquid Administration via -- Medication Administration Via alternative means Compensations -- Postural Changes --   CHL IP OTHER RECOMMENDATIONS 05/24/2020 Recommended Consults -- Oral Care Recommendations Oral care QID Other Recommendations --   CHL IP FOLLOW UP RECOMMENDATIONS 05/24/2020 Follow up Recommendations Outpatient SLP   CHL IP FREQUENCY AND DURATION 04/25/2020 Speech Therapy Frequency (ACUTE ONLY) min 2x/week Treatment Duration 2 weeks      CHL IP ORAL PHASE 05/24/2020 Oral Phase WFL Oral - Pudding Teaspoon -- Oral - Pudding Cup -- Oral - Honey Teaspoon -- Oral - Honey Cup -- Oral - Nectar Teaspoon -- Oral - Nectar Cup -- Oral - Nectar Straw -- Oral - Thin Teaspoon -- Oral - Thin Cup -- Oral - Thin Straw -- Oral - Puree -- Oral - Mech Soft -- Oral - Regular -- Oral - Multi-Consistency -- Oral - Pill -- Oral Phase - Comment --  CHL IP PHARYNGEAL PHASE 05/24/2020 Pharyngeal Phase Impaired Pharyngeal- Pudding Teaspoon -- Pharyngeal -- Pharyngeal- Pudding Cup -- Pharyngeal -- Pharyngeal- Honey Teaspoon -- Pharyngeal -- Pharyngeal- Honey Cup -- Pharyngeal -- Pharyngeal- Nectar Teaspoon -- Pharyngeal -- Pharyngeal- Nectar Cup NT Pharyngeal -- Pharyngeal- Nectar Straw -- Pharyngeal -- Pharyngeal- Thin Teaspoon -- Pharyngeal -- Pharyngeal- Thin Cup Penetration/Aspiration before swallow;Pharyngeal residue - valleculae;Pharyngeal residue - pyriform;Reduced epiglottic inversion;Reduced anterior laryngeal mobility;Reduced laryngeal elevation Pharyngeal Material enters airway, passes BELOW cords without attempt by patient to eject out (silent aspiration) Pharyngeal- Thin Straw -- Pharyngeal -- Pharyngeal- Puree NT Pharyngeal -- Pharyngeal- Mechanical Soft -- Pharyngeal -- Pharyngeal-  Regular Pharyngeal residue - valleculae;Pharyngeal residue - pyriform;Reduced anterior laryngeal mobility;Reduced laryngeal elevation Pharyngeal -- Pharyngeal- Multi-consistency --  Pharyngeal -- Pharyngeal- Pill -- Pharyngeal -- Pharyngeal Comment --  CHL IP CERVICAL ESOPHAGEAL PHASE 05/24/2020 Cervical Esophageal Phase WFL Pudding Teaspoon -- Pudding Cup -- Honey Teaspoon -- Honey Cup -- Nectar Teaspoon -- Nectar Cup -- Nectar Straw -- Thin Teaspoon -- Thin Cup -- Thin Straw -- Puree -- Mechanical Soft -- Regular -- Multi-consistency -- Pill -- Cervical Esophageal Comment -- [place] Siren 05/24/2020, 3:02 PM      Orbie Pyo Colvin Caroli.Ed Actor Pager [phone] Office [phone]         Labs reviewed, ok to proceed.   Benay Pike, MD 06/20/2020 2:27 PM

## 2020-06-20 NOTE — Progress Notes (Signed)
Bilateral upper extremity venous duplex has been completed. Preliminary results can be found in CV Proc through chart review.  Results were given to Dr. Kathlene Cote.  06/20/20 4:43 PM Carlos Levering RVT

## 2020-06-20 NOTE — Progress Notes (Signed)
I called the patient and his sister answered. Discussed about the IJ thrombus that the radiologist noticed on the imaging while he was being prepped for the port. Recommended anticoagulation with Eliquis Discussed risk of bleeding with blood thinners. Port couldnt be placed, PICC line placed. I called in the prescription to the pharmacy at [facility] outpatient per patients preference Recommended he start ASAP They expressed understanding.

## 2020-06-20 NOTE — Assessment & Plan Note (Signed)
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 Review of systems with some dental pain at the site of extraction, otherwise no major complaints. Physical examination with remarkable improvement in cervical lymphadenopathy consistent with response. His labs have been reviewed and okay to proceed with planned weekly cycle 2 of cisplatin.  Port placement scheduled today for chemotherapy administration.

## 2020-06-20 NOTE — Assessment & Plan Note (Signed)
3 to 4 pound weight loss since his last visit despite adequate G-tube nourishment according to family.  He is also scheduled to meet in nutritionist tomorrow.  We will continue to monitor.

## 2020-06-20 NOTE — Progress Notes (Signed)
IR.  Patient presented to [facility] as OP for image-guided Port-a-cath placement at the request of Dr. Chryl Heck.  Patient in IR for procedure. US revealed probable clot in bilateral IJs. Dr. Anselm Pancoast discussed case with Dr. Chryl Heck via telephone who recommends alternative IV access (PICC placement) along with bilateral UE VAS US DVT study prior to discharge home today. Orders placed and VAS [place] notified.  Please call IR with questions/concerns.   Bea Graff Keefe Zawistowski, PA-C 06/20/2020, 3:36 PM

## 2020-06-21 ENCOUNTER — Telehealth: Payer: Self-pay | Admitting: Hematology and Oncology

## 2020-06-21 ENCOUNTER — Other Ambulatory Visit (HOSPITAL_COMMUNITY): Payer: Self-pay

## 2020-06-21 ENCOUNTER — Inpatient Hospital Stay: Payer: Medicaid Other | Admitting: Dietician

## 2020-06-21 ENCOUNTER — Ambulatory Visit
Admission: RE | Admit: 2020-06-21 | Discharge: 2020-06-21 | Disposition: A | Discharge status: Home / Self Care | Payer: Medicaid Other | Source: Ambulatory Visit | Attending: Radiation Oncology | Admitting: Radiation Oncology

## 2020-06-21 ENCOUNTER — Inpatient Hospital Stay: Payer: Medicaid Other

## 2020-06-21 VITALS — BP 123/59 | HR 65 | Temp 97.9°F | Resp 17

## 2020-06-21 DIAGNOSIS — C320 Malignant neoplasm of glottis: Secondary | ICD-10-CM

## 2020-06-21 DIAGNOSIS — Z510 Encounter for antineoplastic radiation therapy: Secondary | ICD-10-CM | POA: Diagnosis not present

## 2020-06-21 MED ORDER — MAGNESIUM SULFATE 2 GM/50ML IV SOLN
INTRAVENOUS | Status: AC
  Filled 2020-06-21: qty 50

## 2020-06-21 MED ORDER — PALONOSETRON HCL INJECTION 0.25 MG/5ML
0.2500 mg | Freq: Once | INTRAVENOUS | Status: AC
  Administered 2020-06-21: 0.25 mg via INTRAVENOUS

## 2020-06-21 MED ORDER — SODIUM CHLORIDE 0.9% FLUSH
10.0000 mL | INTRAVENOUS | Status: DC | PRN
  Administered 2020-06-21: 10 mL
  Filled 2020-06-21: qty 10

## 2020-06-21 MED ORDER — HEPARIN SOD (PORK) LOCK FLUSH 100 UNIT/ML IV SOLN
500.0000 [IU] | Freq: Once | INTRAVENOUS | Status: AC | PRN
  Administered 2020-06-21: 500 [IU]
  Filled 2020-06-21: qty 5

## 2020-06-21 MED ORDER — SODIUM CHLORIDE 0.9 % IV SOLN
150.0000 mg | Freq: Once | INTRAVENOUS | Status: AC
  Administered 2020-06-21: 150 mg via INTRAVENOUS
  Filled 2020-06-21: qty 150

## 2020-06-21 MED ORDER — MAGNESIUM SULFATE 2 GM/50ML IV SOLN
2.0000 g | Freq: Once | INTRAVENOUS | Status: AC
  Administered 2020-06-21: 2 g via INTRAVENOUS

## 2020-06-21 MED ORDER — SODIUM CHLORIDE 0.9 % IV SOLN
40.0000 mg/m2 | Freq: Once | INTRAVENOUS | Status: AC
  Administered 2020-06-21: 63 mg via INTRAVENOUS
  Filled 2020-06-21: qty 63

## 2020-06-21 MED ORDER — POTASSIUM CHLORIDE IN NACL 20-0.9 MEQ/L-% IV SOLN
Freq: Once | INTRAVENOUS | Status: AC
  Filled 2020-06-21: qty 1000

## 2020-06-21 MED ORDER — PALONOSETRON HCL INJECTION 0.25 MG/5ML
INTRAVENOUS | Status: AC
  Filled 2020-06-21: qty 5

## 2020-06-21 MED ORDER — SODIUM CHLORIDE 0.9 % IV SOLN
10.0000 mg | Freq: Once | INTRAVENOUS | Status: AC
  Administered 2020-06-21: 10 mg via INTRAVENOUS
  Filled 2020-06-21: qty 10

## 2020-06-21 MED ORDER — SODIUM CHLORIDE 0.9 % IV SOLN
Freq: Once | INTRAVENOUS | Status: AC
  Filled 2020-06-21: qty 250

## 2020-06-21 NOTE — Telephone Encounter (Signed)
I tried calling the phone number on file to discuss about insomnia. Since he is already using lorazepam, I was hoping to talk to the patient or his sister about avoiding lorazepam if we need to try a different sleeping medication But I couldn't talk to them, no voicemail box available to leave a message. I will try calling him again tomorrow to discuss about this. No prescription was dispensed today , plan to do it once I can talk to the sister or patient.  Mario Peters

## 2020-06-21 NOTE — Progress Notes (Signed)
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 He is s/p PEG and trach on 4/4  Nurse reports patient is hungry after already receiving 6 cartons Osmolite 1.5 this morning prior to infusion appointment. He is asking RN for something to drink by mouth despite NPO orders per SLP. Met with pt in infusion and discussed strict NPO per SLP recommendations, patient understanding. He reports always feeling hungry, he did try eating by mouth but stopped because his "neck swelled up real big" He reports his wife and sister have increased his tube feedings from 8 cartons to 11 or 12 per day. He reports tolerating tube feedings, denies abdominal cramping/distention, nausea, vomiting, but endorses constipation after giving Boost, says he never wants another Boost again. Patient agreeable to contacting sister for further information. Spoke with sister via telephone. She reports increasing tube feedings because he complains of being hungry all the time. Sister states she thought his body was trying to make up for going so long without eating. Sister confirms patients wife gave him 6 cartons of Osmolite 1.5 from 5:45 - 7:30 this morning. She reports patient is getting 5-6 cartons in the morning followed by another 5-6 cartons in the evening as well as giving some chicken broth and gatorade via tube. Sister reports running out of "the milk" and was giving Boost supplement via tube until the formula was delivered. Sister reports Boost caused patient to have constipation, he had "gone days" without a bowel movement. This was relieved after giving patient milk of magnesia. Sister reports patient had diarrhea for a couple days after giving laxative.    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 Hyperthyroidism given increased appetite without weight gain despite increasing tube feedings. Per pt and family report 10-12 cartons Osmolite 1.5/day (3550-4260 kcal, 149-170 g protein) - will route note to Dr. Chryl Heck Discussed with sister via telephone to try gravity feedings vs using plunger to allow for slower feeding time - giving goal rate of 8 cartons cartons Osmolite 1.5 daily.  -2 cartons Osmolite 1.5 QID with 60 ml water flush before and after feedings. Give additional 240 ml free water 3 times daily. This provides 2840 kcal (52.9 kcal/kg), 120 grams protein, 2648 total water. Completed tube feeding education with pt during infusion. Pt successfully demonstrated bolus feeding and gave 1 carton Osmolite 1.5 with 60 ml water flush before and after feeding. Will consider adding fiber containing formula if pt continues to report hyperphasia  Discussed strict NPO with pt per SLP recommendations     MONITORING, EVALUATION, GOAL: weight trends, intake, tube feeding   NEXT VISIT: Thursday June 9

## 2020-06-21 NOTE — Patient Instructions (Signed)
[facility]  Discharge Instructions: Thank you for choosing [facility] to provide your oncology and hematology care.   If you have a lab appointment with the Cancer Center, please go directly to the Cancer Center and check in at the registration area.   Wear comfortable clothing and clothing appropriate for easy access to any Portacath or PICC line.   We strive to give you quality time with your provider. You may need to reschedule your appointment if you arrive late (15 or more minutes).  Arriving late affects you and other patients whose appointments are after yours.  Also, if you miss three or more appointments without notifying the office, you may be dismissed from the clinic at the provider's discretion.      For prescription refill requests, have your pharmacy contact our office and allow 72 hours for refills to be completed.    Today you received the following chemotherapy and/or immunotherapy agents : Cisplatin    To help prevent nausea and vomiting after your treatment, we encourage you to take your nausea medication as directed.  BELOW ARE SYMPTOMS THAT SHOULD BE REPORTED IMMEDIATELY: *FEVER GREATER THAN 100.4 F (38 C) OR HIGHER *CHILLS OR SWEATING *NAUSEA AND VOMITING THAT IS NOT CONTROLLED WITH YOUR NAUSEA MEDICATION *UNUSUAL SHORTNESS OF BREATH *UNUSUAL BRUISING OR BLEEDING *URINARY PROBLEMS (pain or burning when urinating, or frequent urination) *BOWEL PROBLEMS (unusual diarrhea, constipation, pain near the anus) TENDERNESS IN MOUTH AND THROAT WITH OR WITHOUT PRESENCE OF ULCERS (sore throat, sores in mouth, or a toothache) UNUSUAL RASH, SWELLING OR PAIN  UNUSUAL VAGINAL DISCHARGE OR ITCHING   Items with * indicate a potential emergency and should be followed up as soon as possible or go to the Emergency Department if any problems should occur.  Please show the CHEMOTHERAPY ALERT CARD or IMMUNOTHERAPY ALERT CARD at check-in to  the Emergency Department and triage nurse.  Should you have questions after your visit or need to cancel or reschedule your appointment, please contact [facility]  Dept: [phone]  and follow the prompts.  Office hours are 8:00 a.m. to 4:30 p.m. Monday - Friday. Please note that voicemails left after 4:00 p.m. may not be returned until the following business day.  We are closed weekends and major holidays. You have access to a nurse at all times for urgent questions. Please call the main number to the clinic Dept: [phone] and follow the prompts.   For any non-urgent questions, you may also contact your provider using MyChart. We now offer e-Visits for anyone 18 and older to request care online for non-urgent symptoms. For details visit [email or web address].   Also download the MyChart app! Go to the app store, search "MyChart", open the app, select [facility], and log in with your MyChart username and password.  Due to Covid, a mask is required upon entering the hospital/clinic. If you do not have a mask, one will be given to you upon arrival. For doctor visits, patients may have 1 support person aged 18 or older with them. For treatment visits, patients cannot have anyone with them due to current Covid guidelines and our immunocompromised population.   

## 2020-06-22 ENCOUNTER — Encounter: Payer: Self-pay | Admitting: Hematology and Oncology

## 2020-06-23 ENCOUNTER — Telehealth: Payer: Self-pay | Admitting: Hematology and Oncology

## 2020-06-23 MED ORDER — ZOLPIDEM TARTRATE 5 MG PO TABS: 5.0000 mg | ORAL_TABLET | Freq: Every evening | ORAL | 0 refills | Status: AC | PRN

## 2020-06-23 NOTE — Telephone Encounter (Signed)
I called Mr Verner back, Ms [name] answered. She says he just couldn't sleep for the past few days. I asked them to discontinue lorazepam He can try ambien for sleep and we will readdress this concern if ambien doesn't help. Prescription dispensed to their pharmacy of choice.  Laini Urick

## 2020-06-24 ENCOUNTER — Ambulatory Visit: Payer: Medicaid Other

## 2020-06-24 ENCOUNTER — Other Ambulatory Visit: Payer: Self-pay

## 2020-06-24 ENCOUNTER — Encounter: Payer: Self-pay | Admitting: Hematology and Oncology

## 2020-06-24 ENCOUNTER — Emergency Department (HOSPITAL_COMMUNITY): Payer: Medicaid Other

## 2020-06-24 ENCOUNTER — Encounter: Payer: Medicaid Other | Admitting: Nutrition

## 2020-06-24 ENCOUNTER — Ambulatory Visit: Payer: Medicaid Other | Admitting: Hematology and Oncology

## 2020-06-24 ENCOUNTER — Encounter (HOSPITAL_COMMUNITY): Payer: Self-pay

## 2020-06-24 ENCOUNTER — Emergency Department (HOSPITAL_COMMUNITY)
Admission: EM | Admit: 2020-06-24 | Discharge: 2020-06-24 | Disposition: A | Discharge status: Home / Self Care | Payer: Medicaid Other | Attending: Emergency Medicine | Admitting: Emergency Medicine

## 2020-06-24 ENCOUNTER — Other Ambulatory Visit: Payer: Medicaid Other

## 2020-06-24 DIAGNOSIS — J9503 Malfunction of tracheostomy stoma: Secondary | ICD-10-CM | POA: Diagnosis not present

## 2020-06-24 DIAGNOSIS — R000 Tachycardia, unspecified: Secondary | ICD-10-CM | POA: Diagnosis not present

## 2020-06-24 DIAGNOSIS — J9601 Acute respiratory failure with hypoxia: Secondary | ICD-10-CM | POA: Insufficient documentation

## 2020-06-24 DIAGNOSIS — J9509 Other tracheostomy complication: Secondary | ICD-10-CM

## 2020-06-24 DIAGNOSIS — R0603 Acute respiratory distress: Secondary | ICD-10-CM | POA: Diagnosis present

## 2020-06-24 HISTORY — DX: Malignant (primary) neoplasm, unspecified: C80.1

## 2020-06-24 LAB — CBC WITH DIFFERENTIAL/PLATELET
Abs Immature Granulocytes: 0.2 10*3/uL — ABNORMAL HIGH (ref 0.00–0.07)
Basophils Absolute: 0 10*3/uL (ref 0.0–0.1)
Basophils Relative: 0 %
Eosinophils Absolute: 0.1 10*3/uL (ref 0.0–0.5)
Eosinophils Relative: 1 %
HCT: 37.9 % — ABNORMAL LOW (ref 39.0–52.0)
Hemoglobin: 12.4 g/dL — ABNORMAL LOW (ref 13.0–17.0)
Immature Granulocytes: 2 %
Lymphocytes Relative: 20 %
Lymphs Abs: 2.6 10*3/uL (ref 0.7–4.0)
MCH: 31.4 pg (ref 26.0–34.0)
MCHC: 32.7 g/dL (ref 30.0–36.0)
MCV: 95.9 fL (ref 80.0–100.0)
Monocytes Absolute: 1.4 10*3/uL — ABNORMAL HIGH (ref 0.1–1.0)
Monocytes Relative: 11 %
Neutro Abs: 8.9 10*3/uL — ABNORMAL HIGH (ref 1.7–7.7)
Neutrophils Relative %: 66 %
Platelets: 395 10*3/uL (ref 150–400)
RBC: 3.95 MIL/uL — ABNORMAL LOW (ref 4.22–5.81)
RDW: 14.5 % (ref 11.5–15.5)
WBC: 13.3 10*3/uL — ABNORMAL HIGH (ref 4.0–10.5)
nRBC: 0 % (ref 0.0–0.2)

## 2020-06-24 LAB — BASIC METABOLIC PANEL
Anion gap: 11 (ref 5–15)
BUN: 21 mg/dL — ABNORMAL HIGH (ref 6–20)
CO2: 25 mmol/L (ref 22–32)
Calcium: 8.7 mg/dL — ABNORMAL LOW (ref 8.9–10.3)
Chloride: 94 mmol/L — ABNORMAL LOW (ref 98–111)
Creatinine, Ser: 0.56 mg/dL — ABNORMAL LOW (ref 0.61–1.24)
GFR, Estimated: >60 mL/min (ref >60)
Glucose, Bld: 190 mg/dL — ABNORMAL HIGH (ref 70–99)
Potassium: 4 mmol/L (ref 3.5–5.1)
Sodium: 130 mmol/L — ABNORMAL LOW (ref 135–145)

## 2020-06-24 MED ORDER — LACTATED RINGERS IV BOLUS
1000.0000 mL | Freq: Once | INTRAVENOUS | Status: AC
  Administered 2020-06-24: 1000 mL via INTRAVENOUS

## 2020-06-24 MED ORDER — METHYLPREDNISOLONE SODIUM SUCC 125 MG IJ SOLR
125.0000 mg | Freq: Once | INTRAMUSCULAR | Status: AC
  Administered 2020-06-24: 125 mg via INTRAVENOUS

## 2020-06-24 NOTE — ED Provider Notes (Signed)
[facility] EMERGENCY DEPARTMENT Provider Note   CSN: [id number] Arrival date & time: 06/24/20  6389     History Chief Complaint  Patient presents with  . Respiratory Distress    Mario Peters is a 55 y.o. male.  Pt is a 55y/o male with hx of seizures, laryngeal cancer currently receiving radiation and starting systemic chemo with trach and g-tube, and alcohol/tobacco abuse who is presenting today with EMS because of difficulty breathing.  Paramedics give all the history as patient is in distress upon arrival.  They report that patient was sitting in the car with his sister when she stated he suddenly started having shortness of breath.  When they arrived patient was blue, unresponsive and agonal he breathing.  EMS immediately placed in Ambu bag and started bagging through the trach with improvement of oxygen saturation and improvement in patient coloring and responsiveness.  Upon arrival here patient repeatedly voices that he is short of breath and cannot breathe.  Unable to discern if he is having any pain anywhere.  Patient recently had a PICC line placed and was going to be starting on systemic chemotherapy.  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  The history is provided by medical records and the EMS personnel. The history is limited by the condition of the patient.       No past medical history on file.  There are no problems to display for this patient.        No family history on file.     Home Medications Prior to Admission medications   Not on File    Allergies    Patient has no allergy information on record.  Review of Systems   Review of Systems  All other systems reviewed and are negative.   Physical Exam Updated Vital Signs BP 100/62   Pulse 76   Temp 98 F (36.7 C) (Oral)   Resp 17   Ht 5\' 8"  (1.727 m)   Wt 53.5 kg   SpO2 99%   BMI 17.94 kg/m   Physical  Exam Vitals and nursing note reviewed.  Constitutional:      General: He is in acute distress.     Appearance: He is well-developed. He is ill-appearing and diaphoretic.  HENT:     Head: Normocephalic and atraumatic.     Mouth/Throat:     Mouth: Mucous membranes are dry.  Eyes:     Conjunctiva/sclera: Conjunctivae normal.     Pupils: Pupils are equal, round, and reactive to light.  Neck:     Comments: Swelling of the neck.  Trach in place but stridor with inspiration Cardiovascular:     Rate and Rhythm: Regular rhythm. Tachycardia present.     Pulses: Normal pulses.     Heart sounds: No murmur heard.   Pulmonary:     Effort: Tachypnea, accessory muscle usage, respiratory distress and retractions present.     Breath sounds: Wheezing and rhonchi present. No rales.  Abdominal:     General: There is no distension.     Palpations: Abdomen is soft.     Tenderness: There is no abdominal tenderness. There is no guarding or rebound.     Comments: G-tube in place without any drainage  Musculoskeletal:        General: No tenderness. Normal range of motion.     Cervical back: Normal range of motion and neck supple.  Right lower leg: No edema.     Left lower leg: No edema.     Comments: PICC line present in the right upper extremity  Skin:    General: Skin is warm.     Findings: No erythema or rash.  Neurological:     Mental Status: He is alert. Mental status is at baseline.     Comments: Awake and attempting to answer questions  Psychiatric:     Comments: Distressed     ED Results / Procedures / Treatments   Labs (all labs ordered are listed, but only abnormal results are displayed) Labs Reviewed  CBC WITH DIFFERENTIAL/PLATELET - Abnormal; Notable for the following components:      Result Value   WBC 13.3 (*)    RBC 3.95 (*)    Hemoglobin 12.4 (*)    HCT 37.9 (*)    Neutro Abs 8.9 (*)    Monocytes Absolute 1.4 (*)    Abs Immature Granulocytes 0.20 (*)    All other  components within normal limits  BASIC METABOLIC PANEL - Abnormal; Notable for the following components:   Sodium 130 (*)    Chloride 94 (*)    Glucose, Bld 190 (*)    BUN 21 (*)    Creatinine, Ser 0.56 (*)    Calcium 8.7 (*)    All other components within normal limits    EKG EKG Interpretation  Date/Time:  Monday June 24 2020 08:32:00 EDT Ventricular Rate:  113 PR Interval:  138 QRS Duration: 83 QT Interval:  313 QTC Calculation: 430 R Axis:   85 Text Interpretation: Sinus tachycardia Biatrial enlargement Borderline right axis deviation Nonspecific T abnormalities, lateral leads No significant change since last tracing Confirmed by Blanchie Dessert ([id number]) on 06/24/2020 8:49:10 AM   Radiology DG Chest Port 1 View  Result Date: 06/24/2020 CLINICAL DATA:  Shortness of breath EXAM: PORTABLE CHEST 1 VIEW COMPARISON:  April 22, 2020 FINDINGS: Tracheostomy present with tracheostomy catheter tip 6.5 cm above the carina. Central catheter tip is at the cavoatrial junction. No pneumothorax. Lungs are clear. Heart size and pulmonary vascularity are normal. No adenopathy no bone lesions. IMPRESSION: Tube and catheter positions as described without pneumothorax. Lungs clear. Cardiac silhouette normal. Electronically Signed   By: Lowella Grip III M.D.   On: 06/24/2020 09:02    Procedures Procedures   Medications Ordered in ED Medications  methylPREDNISolone sodium succinate (SOLU-MEDROL) 125 mg/2 mL injection 125 mg (125 mg Intravenous Given 06/24/20 9470)    ED Course  I have reviewed the triage vital signs and the nursing notes.  Pertinent labs & imaging results that were available during my care of the patient were reviewed by me and considered in my medical decision making (see chart for details).    MDM Rules/Calculators/A&P                          55 year old male with laryngeal cancer currently getting prepared to do chemotherapy and having radiation presenting due to  respiratory distress.  Patient was found agonal he breathing and hypoxic which sister reported happened suddenly in the car.  Patient is now satting 100% but appears to be in distress.  He has inspiratory stridor.  Patient has a 6 Shiley without an inner cannula.  Unable to suction due to obstruction.  Patient did have some wheezing initially on evaluation and with history of tobacco use he was given albuterol and Atrovent however there was no  improvement.  Lurline Idol was replaced with a new 6.0 Shiley with significant improvement in work of breathing and oxygen saturation now 100%.  Patient looks much more comfortable.  Significant mucus removed from new trach and old one appeared to be 90% obstructed by hard thick mucus.  Patient's breath sounds are now much clearer.  Plain films, labs are pending.  We will recheck on the patient after he has time to rest to reevaluate ongoing needs.  EKG showed sinus tachycardia but no other acute changes.  Patient is denying any chest pain.  10:12 AM Patient's chest x-ray is clear.  On reevaluation patient now is satting 100% in no respiratory distress and reports he feels back to normal.  Sister is now present and reports he was fine when she picked him up today and they were planning on going somewhere when he started coughing in the car and then stating that he could not breathe.  There were large chunks of bloody mucus coming out and then he suddenly had worse and worse trouble breathing.  She removed his inner cannula but it did not help and then he went unresponsive.  Based on the obstruction of his trach upon arrival here suspect that was the cause of his symptoms.  He other wise reported he had been doing well.  He did recently start Eliquis due to an IJ thrombus and mucus we removed from his trach here had some blood-tinged sputum but there was no frank bleeding.  CBC with hemoglobin stable at 12 white count mildly elevated at 13.  10:30 AM Bmp with normal cr and mild  hyponatremia of 130.  Blood pressure has been soft in the mid 90's but normal maps.  Will give bolus of fluid.  Will have RT come and suction again and pt does have suction and blow by at home.  Anticipate pt will be able to go home.  Oxygen removed and pt remained 100% on RA and no distress.  11:15 AM Pt still well appearing.  Mario Peters with pulm has evaluated the pt.  Pt feels comfortable going home.  Has remained 100% on RA.  Has the supplies for his trach at home.  Has follow up in the future. BP improved with IVF.  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

## 2020-06-24 NOTE — ED Triage Notes (Addendum)
BIB EMS for rspiratory distress. Pt was in a gas station when he started having agonal breathing. Recent cancer diagnosis, pt has a trach.

## 2020-06-24 NOTE — ED Notes (Signed)
RT at bedside trying to suction trach.

## 2020-06-24 NOTE — Progress Notes (Signed)
Pt BIB EMS, pt was being manually bagged by EMS. Pt does have a tracheostomy in place (#6 shiley flex uncuffed). Pt stated that he has had his current trach since April, 2022. RT attempted to pass suction cath through trach, was unsuccessful. RT x2 with RN and MD at bedside changed pt's trach with a new #6 shiley flex uncuffed trach. Trach change was successful with good color change in CO2 detector, BL BS, and SVS. RT was able to pass suction cath through new trach. Pt tolerated procedure well.

## 2020-06-24 NOTE — Discharge Planning (Addendum)
RNCM consulted regarding pt needing replacement portable suction machine and supplies.  RNCM contacted DME company ([employer]), who will have technician go to home and service/replace suction machine as needed.

## 2020-06-24 NOTE — Discharge Instructions (Signed)
All the blood work looked okay and your x-ray was normal.  Make sure you are doing your suctioning like you normally do and use your humidifier at home.  Your blood pressure improved after getting some IV fluids and make sure your feeds and fluids continue.  You should be cleared to go to your chemo appointment on Thursday but if you have any other trouble breathing return to the ER immediately.

## 2020-06-24 NOTE — Progress Notes (Signed)
Reason for visit Pt request  HPI Well known to me. Follow for trach management in the setting of head & neck cancer. He is currently s/p round 2 of chemo and actively undergoing XRT. He was in usual [facility] but then today became acutely obstructed while in car at a gas station. EMS called. Required manual ventilation in transfer. On arrival RT unable to pass suction. EDP able to change trach w/out difficulty and now pt completely at baseline.   No new cough, fever or worse sputum production since last visit. Actually has been very compliant w/ his diet. Not eating & getting his nutrition via feeding tube  Exam General sitting up in bed. No distress.  HENT 6 cuffless trach in place. Some bloody drainage.  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 He looks good. I suspect that the mucous plugging could actually still be an intermittent issue especially as he's undergoing XRT.   Plan Have placed order to get him portable suction machine as well Will see him a little more frequently in trach clinic for routine change. He is now scheduled for 28th this month.   Erick Colace ACNP-BC [place] Pager # [phone] OR # [phone] if no answer

## 2020-06-24 NOTE — ED Notes (Signed)
RN Verdis Frederickson notified of pt bp of 89/61

## 2020-06-25 ENCOUNTER — Ambulatory Visit
Admission: RE | Admit: 2020-06-25 | Discharge: 2020-06-25 | Disposition: A | Discharge status: Home / Self Care | Payer: Medicaid Other | Source: Ambulatory Visit | Attending: Radiation Oncology | Admitting: Radiation Oncology

## 2020-06-25 ENCOUNTER — Encounter: Payer: Self-pay | Admitting: Hematology and Oncology

## 2020-06-25 DIAGNOSIS — Z510 Encounter for antineoplastic radiation therapy: Secondary | ICD-10-CM | POA: Diagnosis not present

## 2020-06-26 ENCOUNTER — Other Ambulatory Visit: Payer: Self-pay

## 2020-06-26 ENCOUNTER — Ambulatory Visit
Admission: RE | Admit: 2020-06-26 | Discharge: 2020-06-26 | Disposition: A | Discharge status: Home / Self Care | Payer: Medicaid Other | Source: Ambulatory Visit | Attending: Radiation Oncology | Admitting: Radiation Oncology

## 2020-06-26 DIAGNOSIS — Z510 Encounter for antineoplastic radiation therapy: Secondary | ICD-10-CM | POA: Diagnosis not present

## 2020-06-27 ENCOUNTER — Other Ambulatory Visit (HOSPITAL_COMMUNITY): Payer: Self-pay

## 2020-06-27 ENCOUNTER — Other Ambulatory Visit: Payer: Self-pay | Admitting: Acute Care

## 2020-06-27 ENCOUNTER — Inpatient Hospital Stay: Payer: Medicaid Other | Admitting: Nutrition

## 2020-06-27 ENCOUNTER — Ambulatory Visit
Admission: RE | Admit: 2020-06-27 | Discharge: 2020-06-27 | Disposition: A | Discharge status: Home / Self Care | Payer: Medicaid Other | Source: Ambulatory Visit | Attending: Radiation Oncology | Admitting: Radiation Oncology

## 2020-06-27 ENCOUNTER — Inpatient Hospital Stay: Payer: Medicaid Other

## 2020-06-27 ENCOUNTER — Inpatient Hospital Stay (HOSPITAL_BASED_OUTPATIENT_CLINIC_OR_DEPARTMENT_OTHER): Payer: Medicaid Other | Admitting: Hematology and Oncology

## 2020-06-27 ENCOUNTER — Inpatient Hospital Stay: Payer: Medicaid Other | Admitting: General Practice

## 2020-06-27 ENCOUNTER — Encounter: Payer: Self-pay | Admitting: Hematology and Oncology

## 2020-06-27 VITALS — BP 97/75 | HR 88 | Temp 98.5°F | Ht 68.0 in | Wt 119.0 lb

## 2020-06-27 DIAGNOSIS — R634 Abnormal weight loss: Secondary | ICD-10-CM

## 2020-06-27 DIAGNOSIS — G4701 Insomnia due to medical condition: Secondary | ICD-10-CM | POA: Diagnosis not present

## 2020-06-27 DIAGNOSIS — C320 Malignant neoplasm of glottis: Secondary | ICD-10-CM | POA: Diagnosis not present

## 2020-06-27 DIAGNOSIS — G4700 Insomnia, unspecified: Secondary | ICD-10-CM | POA: Insufficient documentation

## 2020-06-27 DIAGNOSIS — C760 Malignant neoplasm of head, face and neck: Secondary | ICD-10-CM

## 2020-06-27 DIAGNOSIS — Z510 Encounter for antineoplastic radiation therapy: Secondary | ICD-10-CM | POA: Diagnosis not present

## 2020-06-27 LAB — CBC WITH DIFFERENTIAL (CANCER CENTER ONLY)
Abs Immature Granulocytes: 0.06 10*3/uL (ref 0.00–0.07)
Basophils Absolute: 0 10*3/uL (ref 0.0–0.1)
Basophils Relative: 0 %
Eosinophils Absolute: 0.1 10*3/uL (ref 0.0–0.5)
Eosinophils Relative: 1 %
HCT: 33.6 % — ABNORMAL LOW (ref 39.0–52.0)
Hemoglobin: 11.3 g/dL — ABNORMAL LOW (ref 13.0–17.0)
Immature Granulocytes: 1 %
Lymphocytes Relative: 10 %
Lymphs Abs: 1 10*3/uL (ref 0.7–4.0)
MCH: 31.1 pg (ref 26.0–34.0)
MCHC: 33.6 g/dL (ref 30.0–36.0)
MCV: 92.6 fL (ref 80.0–100.0)
Monocytes Absolute: 1 10*3/uL (ref 0.1–1.0)
Monocytes Relative: 11 %
Neutro Abs: 7.3 10*3/uL (ref 1.7–7.7)
Neutrophils Relative %: 77 %
Platelet Count: 253 10*3/uL (ref 150–400)
RBC: 3.63 MIL/uL — ABNORMAL LOW (ref 4.22–5.81)
RDW: 14.2 % (ref 11.5–15.5)
WBC Count: 9.5 10*3/uL (ref 4.0–10.5)
nRBC: 0 % (ref 0.0–0.2)

## 2020-06-27 LAB — BASIC METABOLIC PANEL - CANCER CENTER ONLY
Anion gap: 9 (ref 5–15)
BUN: 15 mg/dL (ref 6–20)
CO2: 27 mmol/L (ref 22–32)
Calcium: 9.3 mg/dL (ref 8.9–10.3)
Chloride: 96 mmol/L — ABNORMAL LOW (ref 98–111)
Creatinine: 0.55 mg/dL — ABNORMAL LOW (ref 0.61–1.24)
GFR, Estimated: >60 mL/min (ref >60)
Glucose, Bld: 91 mg/dL (ref 70–99)
Potassium: 4.6 mmol/L (ref 3.5–5.1)
Sodium: 132 mmol/L — ABNORMAL LOW (ref 135–145)

## 2020-06-27 LAB — TSH: TSH: 0.464 u[IU]/mL (ref 0.320–4.118)

## 2020-06-27 LAB — T4, FREE: Free T4: 0.96 ng/dL (ref 0.61–1.12)

## 2020-06-27 LAB — MAGNESIUM: Magnesium: 2.6 mg/dL — ABNORMAL HIGH (ref 1.7–2.4)

## 2020-06-27 MED ORDER — GUAIFENESIN 100 MG/5ML PO SOLN
15.0000 mL | Freq: Three times a day (TID) | ORAL | 0 refills | Status: AC
  Filled 2020-06-27: qty 236, 5d supply, fill #0

## 2020-06-27 MED ORDER — MAGNESIUM SULFATE 2 GM/50ML IV SOLN
2.0000 g | Freq: Once | INTRAVENOUS | Status: AC
  Administered 2020-06-27: 2 g via INTRAVENOUS

## 2020-06-27 MED ORDER — SODIUM CHLORIDE 0.9% FLUSH
10.0000 mL | INTRAVENOUS | Status: DC | PRN
  Administered 2020-06-27: 10 mL
  Filled 2020-06-27: qty 10

## 2020-06-27 MED ORDER — PALONOSETRON HCL INJECTION 0.25 MG/5ML
0.2500 mg | Freq: Once | INTRAVENOUS | Status: AC
  Administered 2020-06-27: 0.25 mg via INTRAVENOUS

## 2020-06-27 MED ORDER — SODIUM CHLORIDE 0.9 % IV SOLN
10.0000 mg | Freq: Once | INTRAVENOUS | Status: AC
  Administered 2020-06-27: 10 mg via INTRAVENOUS
  Filled 2020-06-27: qty 10

## 2020-06-27 MED ORDER — HEPARIN SOD (PORK) LOCK FLUSH 100 UNIT/ML IV SOLN
500.0000 [IU] | Freq: Once | INTRAVENOUS | Status: AC | PRN
  Administered 2020-06-27: 250 [IU]
  Filled 2020-06-27: qty 5

## 2020-06-27 MED ORDER — POTASSIUM CHLORIDE IN NACL 20-0.9 MEQ/L-% IV SOLN
Freq: Once | INTRAVENOUS | Status: AC
  Filled 2020-06-27: qty 1000

## 2020-06-27 MED ORDER — SODIUM CHLORIDE 0.9 % IV SOLN
150.0000 mg | Freq: Once | INTRAVENOUS | Status: AC
  Administered 2020-06-27: 150 mg via INTRAVENOUS
  Filled 2020-06-27: qty 150

## 2020-06-27 MED ORDER — MAGNESIUM SULFATE 2 GM/50ML IV SOLN
INTRAVENOUS | Status: AC
  Filled 2020-06-27: qty 50

## 2020-06-27 MED ORDER — AMOXICILLIN-POT CLAVULANATE 250-62.5 MG/5ML PO SUSR
500.0000 mg | Freq: Two times a day (BID) | ORAL | 0 refills | Status: AC
  Filled 2020-06-27 (×3): qty 200, 10d supply, fill #0

## 2020-06-27 MED ORDER — PALONOSETRON HCL INJECTION 0.25 MG/5ML
INTRAVENOUS | Status: AC
  Filled 2020-06-27: qty 5

## 2020-06-27 MED ORDER — JEVITY 1.5 CAL/FIBER PO LIQD: ORAL | 6 refills | Status: AC

## 2020-06-27 MED ORDER — SODIUM CHLORIDE 0.9 % IV SOLN
Freq: Once | INTRAVENOUS | Status: AC
  Filled 2020-06-27: qty 250

## 2020-06-27 MED ORDER — SODIUM CHLORIDE 0.9 % IV SOLN
40.0000 mg/m2 | Freq: Once | INTRAVENOUS | Status: AC
  Administered 2020-06-27: 63 mg via INTRAVENOUS
  Filled 2020-06-27: qty 63

## 2020-06-27 NOTE — Progress Notes (Signed)
Nutrition follow-up completed with patient receiving concurrent chemoradiation therapy for Larynex cancer.  He is status post PEG and trach on April 4.  Weight documented as 119 pounds June 9 improved from 116 pounds May 12. Patient denies nausea, vomiting, constipation, and diarrhea. Reports he is using 8 cartons of Osmolite 1.5 daily.  He flushes feeding tube before and after without difficulty.  He denies problems with his feeding tube. Patient continues to complain of increased hunger.  He is NPO.  Noted labs: TSH within normal limits at 0.464, sodium 132, creatinine 0.55 and magnesium 2.6.  Estimated nutrition needs: 2350-2650 cal, 90-100 g protein, 2.6 L fluid.  Nutrition diagnosis: Severe malnutrition improving.  Intervention: We will try to substitute 1 carton Jevity 1.5 for 1 carton Osmolite 1.5 to provide additional fiber.  Increase slowly as tolerated up to 4 cartons Jevity 1.5 daily.  New orders written and [employer] notified.  Continue 8 cartons of tube feeding total daily with 60 mL of water before and after bolus feedings.  Continue to give tube feeding slowly over 20-30 minutes.  Give additional 240 mL of free water 3 times a day between feedings.  This provides 2840 cal (52.9 kcals per kilogram), 120 g protein and 2648 mL free water.  Monitoring, evaluation, goals: Patient will tolerate tube feeding to meet estimated nutrition needs  Next visit: Thursday, June 16 during infusion.  **Disclaimer: This note was dictated with voice recognition software. Similar sounding words can inadvertently be transcribed and this note may contain transcription errors which may not have been corrected upon publication of note.**

## 2020-06-27 NOTE — Progress Notes (Signed)
Spoke to Christiaan's sister via phone today. He is continuing to have increased need for suction and thick mucous. He occluded his trach almost arresting 2 days ago. This could all XRT related but he is certainly high risk for tracheobronchitis and aspiration PNA Plan Added Guaifenesin VT to help w secretions Sent order for augmentin 500 VT BID x 5 d Instructed sister to update me Monday on status   Erick Colace ACNP-BC [place] Pager # [phone] OR # [phone] if no answer

## 2020-06-27 NOTE — Assessment & Plan Note (Signed)
This has not been an issue for the past 2 weeks. He is using 8 cans of osmolite via G tube, His appetite still appears to be voracious Will check TSH and T4 for any evidence of hyperthyrodism.

## 2020-06-27 NOTE — Progress Notes (Signed)
[facility] CSW Progress Notes  Met w patient in infusion, enrolled in [facility] and provided first disbursement.  Gave my card w contact information, asked that he let us know when he needs additional help, can request up to 3 more disbursements.  He has a friend who can call us for him as he has difficulty speaking over the phone.  Edwyna Shell, LCSW Clinical Social Worker Phone:  [phone]

## 2020-06-27 NOTE — Assessment & Plan Note (Signed)
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 ROS, pt says the mass is bigger and the neck is tighter, ongoing insomnia PE Similar appearing lymphadenopathy in the neck, otherwise he appears well. Will discuss with Dr Isidore Moos about his concerns. We will proceed with chemotherapy as planned.

## 2020-06-27 NOTE — Patient Instructions (Signed)
[facility]  Discharge Instructions: Thank you for choosing [facility] to provide your oncology and hematology care.   If you have a lab appointment with the Cancer Center, please go directly to the Cancer Center and check in at the registration area.   Wear comfortable clothing and clothing appropriate for easy access to any Portacath or PICC line.   We strive to give you quality time with your provider. You may need to reschedule your appointment if you arrive late (15 or more minutes).  Arriving late affects you and other patients whose appointments are after yours.  Also, if you miss three or more appointments without notifying the office, you may be dismissed from the clinic at the provider's discretion.      For prescription refill requests, have your pharmacy contact our office and allow 72 hours for refills to be completed.    Today you received the following chemotherapy and/or immunotherapy agents : Cisplatin    To help prevent nausea and vomiting after your treatment, we encourage you to take your nausea medication as directed.  BELOW ARE SYMPTOMS THAT SHOULD BE REPORTED IMMEDIATELY: *FEVER GREATER THAN 100.4 F (38 C) OR HIGHER *CHILLS OR SWEATING *NAUSEA AND VOMITING THAT IS NOT CONTROLLED WITH YOUR NAUSEA MEDICATION *UNUSUAL SHORTNESS OF BREATH *UNUSUAL BRUISING OR BLEEDING *URINARY PROBLEMS (pain or burning when urinating, or frequent urination) *BOWEL PROBLEMS (unusual diarrhea, constipation, pain near the anus) TENDERNESS IN MOUTH AND THROAT WITH OR WITHOUT PRESENCE OF ULCERS (sore throat, sores in mouth, or a toothache) UNUSUAL RASH, SWELLING OR PAIN  UNUSUAL VAGINAL DISCHARGE OR ITCHING   Items with * indicate a potential emergency and should be followed up as soon as possible or go to the Emergency Department if any problems should occur.  Please show the CHEMOTHERAPY ALERT CARD or IMMUNOTHERAPY ALERT CARD at check-in to  the Emergency Department and triage nurse.  Should you have questions after your visit or need to cancel or reschedule your appointment, please contact [facility]  Dept: [phone]  and follow the prompts.  Office hours are 8:00 a.m. to 4:30 p.m. Monday - Friday. Please note that voicemails left after 4:00 p.m. may not be returned until the following business day.  We are closed weekends and major holidays. You have access to a nurse at all times for urgent questions. Please call the main number to the clinic Dept: [phone] and follow the prompts.   For any non-urgent questions, you may also contact your provider using MyChart. We now offer e-Visits for anyone 18 and older to request care online for non-urgent symptoms. For details visit [email or web address].   Also download the MyChart app! Go to the app store, search "MyChart", open the app, select [facility], and log in with your MyChart username and password.  Due to Covid, a mask is required upon entering the hospital/clinic. If you do not have a mask, one will be given to you upon arrival. For doctor visits, patients may have 1 support person aged 18 or older with them. For treatment visits, patients cannot have anyone with them due to current Covid guidelines and our immunocompromised population.   

## 2020-06-27 NOTE — Progress Notes (Signed)
[facility] NOTE  Patient Care Team: Pcp, No as PCP - General Mal[place]elt, Mario Police, RN as Oncology Nurse Navigator Mario Pike, MD as Consulting Physician (Hematology and Oncology) Mario Gibson, MD as Consulting Physician (Radiation Oncology) Mario Gala, MD as Consulting Physician (Otolaryngology) Patient, [name] (General Practice)  CHIEF COMPLAINTS/PURPOSE OF CONSULTATION:   Laryngeal SCC, follow up prior to week 3 of cisplatin.  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 ROS, pt says the mass is bigger and the neck is tighter, ongoing insomnia PE Similar appearing lymphadenopathy in the neck, otherwise he appears well. Will discuss with Dr Mario Peters about his concerns. We will proceed with chemotherapy as planned.  Weight loss, unintentional This has not been an issue for the past 2 weeks. He is using 8 cans of osmolite via G tube, His appetite still appears to be voracious Will check TSH and T4 for any evidence of hyperthyrodism.  Insomnia Insomnia likely situational from anxiety Will recommend ambien 5 mg po QHS for sleep. He says this helps him a few hrs  I think its his fear of not being able to breathe when he sleeps that has been affecting him Reassured him to try [place] for a few more days.  Orders Placed This Encounter  Procedures   TSH    Standing Status:   Standing    Number of Occurrences:   22    Standing Expiration Date:   06/27/2021   T4, free    Standing Status:   Standing    Number of Occurrences:   22    Standing Expiration Date:   06/27/2021     HISTORY OF PRESENTING ILLNESS:   Mario Peters 55 y.o. male is here because of newly diagnosed laryngeal SCC  Oncology  History Overview Note  Mario Peters is a 56 year old male with a past medical history significant for alcohol tobacco abuse, and history of seizures.  The patient presented with severe dysphagia, cough, fatigue.  He has had significant weight loss recently and hoarseness of his voice.  Symptoms present for approximately 4 months.  CT of the chest on admission showed diffuse bilateral pulmonary parenchymal changes consistent with multifocal pneumonia possible related to aspiration.  CT of the neck showed mildly hyperdense focus at the posterior aspect of the left vocal fold with associated soft tissue thickening which could be laryngeal neoplasm.  He was seen by ENT and flex fiberoptic lower discopathy was performed showing a large partially obstructing laryngeal tumor.  He was taken to the OR on 04/22/2020 underwent tracheostomy and biopsy.  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  P16 negative.   He is status post G-tube placement by IR on 04/24/2020.   He was initially seen in the hospital. Recommendation was to proceed with outpatient PET CT and return to follow up with medical oncology outpatient. PET CT without any clear evidence of metastatic disease.   Glottis carcinoma ([place])  05/08/2020 Initial Diagnosis   Glottis carcinoma ([place])    05/08/2020 Cancer Staging   Staging form: Larynx - Glottis, AJCC 8th Edition - Clinical stage from 05/08/2020: Stage IVA (cT3, cN2c, cM0) - Signed by Mario Pike, MD on 05/27/2020  Stage prefix: Initial diagnosis  06/13/2020 -  Chemotherapy    Patient is on Treatment Plan: HEAD/NECK CISPLATIN Q7D        C1D1 of weekly cisplatin on  06/13/2020 C2D1 of weekly cisplatin on 6/ 03/2020  Interval History  Patient is here for a follow-up with his sister. Since his last visit, he had to go to the ED, he had a mucus plug in trach. He is doing well otherwise. He says the tumor is growing bigger and he feels the  neck is tighter. No change in breathing. No change in bowel or urinary habits. He has tried [place] but still continues to have insomnia. He is worried at times that he cant breathe if he sleeps Rest of the 10 point ROS reviewed and neg  MEDICAL HISTORY:  Past Medical History:  Diagnosis Date   lung    Seizures ([place])     SURGICAL HISTORY: Past Surgical History:  Procedure Laterality Date   DIRECT LARYNGOSCOPY N/A 04/22/2020   Procedure: DIRECT LARYNGOSCOPY WITH BIOPSY;  Surgeon: Mario Gala, MD;  Location: [facility];  Service: ENT;  Laterality: N/A;   ESOPHAGOSCOPY  04/22/2020   Procedure: ESOPHAGOSCOPY;  Surgeon: Mario Gala, MD;  Location: [facility];  Service: ENT;;   IR GASTROSTOMY TUBE MOD SED  04/24/2020   MULTIPLE EXTRACTIONS WITH ALVEOLOPLASTY N/A 05/30/2020   Procedure: MULTIPLE EXTRACTION WITH ALVEOLOPLASTY;  Surgeon: Mario Peters, DMD;  Location: [facility];  Service: Dentistry;  Laterality: N/A;   tracheostomy     TRACHEOSTOMY TUBE PLACEMENT N/A 04/22/2020   Procedure: TRACHEOSTOMY;  Surgeon: Mario Gala, MD;  Location: [facility];  Service: ENT;  Laterality: N/A;    SOCIAL HISTORY: Social History   Socioeconomic History   Marital status: Single    Spouse name: Not on file   Number of children: Not on file   Years of education: Not on file   Highest education level: Not on file  Occupational History   Not on file  Tobacco Use   Smoking status: Former    Packs/day: 2.00    Pack years: 0.00    Types: Cigarettes   Smokeless tobacco: Never   Tobacco comments:    March 2022  Vaping Use   Vaping Use: Never used  Substance and Sexual Activity   Alcohol use: Not Currently    Comment: quit January 2022   Drug use: Not Currently    Types: Marijuana    Comment: occassional weed   Sexual activity: Not on file  Other Topics Concern   Not on file  Social History Narrative   ** Merged History Encounter **       Social Determinants of Health   Financial Resource Strain: Not on  file  Food Insecurity: Not on file  Transportation Needs: Not on file  Physical Activity: Not on file  Stress: Not on file  Social Connections: Not on file  Intimate Partner Violence: Not At Risk   Fear of Current or Ex-Partner: No   Emotionally Abused: No   Physically Abused: No   Sexually Abused: No    FAMILY HISTORY: Family History  Problem Relation Age of Onset   Hypertension Other     ALLERGIES:  has No Known Allergies.  MEDICATIONS:  Current Outpatient Medications  Medication Sig Dispense Refill   acetaminophen (TYLENOL) 325 MG tablet Take 2 tablets (650 mg total) by mouth every 6 (six) hours as needed for mild pain (or Fever >/= 101).     amoxicillin-clavulanate (AUGMENTIN) 250-62.5 MG/5ML suspension Place 10  mLs (500 mg total) into feeding tube 2 (two) times daily for 10 days. 200 mL 0   apixaban (ELIQUIS) 5 MG TABS tablet Take 2 tablets (10 mg total) by mouth 2 (two) times daily for 7 days then 1 tablet 2 times a day 74 tablet 0   dexamethasone (DECADRON) 4 MG tablet Take 2 tablets (8 mg total) by mouth daily. Take daily x 3 days starting the day after cisplatin chemotherapy. Take with food. 30 tablet 1   guaiFENesin (ROBITUSSIN) 100 MG/5ML SOLN Place 15 mLs (300 mg total) into feeding tube in the morning, at noon, and at bedtime. 236 mL 0   LORazepam (ATIVAN) 0.5 MG tablet Take 1 tablet (0.5 mg total) by mouth every 12 (twelve) hours as needed for anxiety. 30 tablet 0   Nutritional Supplements (FEEDING SUPPLEMENT, OSMOLITE 1.5 CAL,) LIQD Place 474 mLs into feeding tube 4 (four) times daily.  0   ondansetron (ZOFRAN-ODT) 8 MG disintegrating tablet Take 8 mg by mouth 2 (two) times daily as needed.     prochlorperazine (COMPAZINE) 10 MG tablet Take 1 tablet (10 mg total) by mouth every 6 (six) hours as needed (Nausea or vomiting). 30 tablet 1   zolpidem (AMBIEN) 5 MG tablet Take 1 tablet (5 mg total) by mouth at bedtime as needed for sleep. 20 tablet 0   No current  facility-administered medications for this visit.   Facility-Administered Medications Ordered in Other Visits  Medication Dose Route Frequency Provider Last Rate Last Admin   CISplatin (PLATINOL) 63 mg in sodium chloride 0.9 % 250 mL chemo infusion  40 mg/m2 (Treatment Plan Recorded) Intravenous Once Eylin Pontarelli, Arletha Pili, MD       fosaprepitant (EMEND) 150 mg in sodium chloride 0.9 % 145 mL IVPB  150 mg Intravenous Once Zakariah Urwin, MD 450 mL/hr at 06/27/20 1308 150 mg at 06/27/20 1308   heparin lock flush 100 unit/mL  500 Units Intracatheter Once PRN Dewight Catino, MD       sodium chloride flush (NS) 0.9 % injection 10 mL  10 mL Intracatheter PRN Akaash Vandewater, MD        PHYSICAL EXAMINATION:  ECOG PERFORMANCE STATUS: 1 - Symptomatic but completely ambulatory  Vitals:   06/27/20 0926  BP: 97/75  Pulse: 88  Temp: 98.5 F (36.9 C)  SpO2: 100%   Filed Weights   06/27/20 0926  Weight: 119 lb (54 kg)    Physical Exam Constitutional:      Appearance: Normal appearance.  HENT:     Head: Normocephalic and atraumatic.     Mouth/Throat:     Mouth: Mucous membranes are moist.     Comments: No mucositis. Neck:     Comments: Trach in place.  Cardiovascular:     Rate and Rhythm: Normal rate and regular rhythm.     Pulses: Normal pulses.     Heart sounds: Normal heart sounds.  Pulmonary:     Effort: Pulmonary effort is normal.     Breath sounds: Normal breath sounds.  Abdominal:     General: Abdomen is flat.     Palpations: Abdomen is soft.     Comments: G-tube in place  Musculoskeletal:        General: No swelling or tenderness.  Lymphadenopathy:     Cervical: Cervical adenopathy (compared to last week, I cant notice much change, but patient says its bigger and his neck is tighter) present.  Skin:    General: Skin is warm and dry.  Neurological:  General: No focal deficit present.     Mental Status: He is alert.  Psychiatric:        Mood and Affect: Mood normal.         Behavior: Behavior normal.      LABORATORY DATA:  I have reviewed the data as listed Lab Results  Component Value Date   WBC 9.5 06/27/2020   HGB 11.3 (L) 06/27/2020   HCT 33.6 (L) 06/27/2020   MCV 92.6 06/27/2020   PLT 253 06/27/2020     Chemistry      Component Value Date/Time   NA 132 (L) 06/27/2020 0900   K 4.6 06/27/2020 0900   CL 96 (L) 06/27/2020 0900   CO2 27 06/27/2020 0900   BUN 15 06/27/2020 0900   CREATININE 0.55 (L) 06/27/2020 0900      Component Value Date/Time   CALCIUM 9.3 06/27/2020 0900   ALKPHOS 58 04/29/2020 0116   AST 29 04/29/2020 0116   ALT 19 04/29/2020 0116   BILITOT 0.5 04/29/2020 0116       RADIOGRAPHIC STUDIES: I have personally reviewed the radiological images as listed and agreed with the findings in the report. DG Chest Port 1 View  Result Date: 06/24/2020 CLINICAL DATA:  Shortness of breath EXAM: PORTABLE CHEST 1 VIEW COMPARISON:  April 22, 2020 FINDINGS: Tracheostomy present with tracheostomy catheter tip 6.5 cm above the carina. Central catheter tip is at the cavoatrial junction. No pneumothorax. Lungs are clear. Heart size and pulmonary vascularity are normal. No adenopathy no bone lesions. IMPRESSION: Tube and catheter positions as described without pneumothorax. Lungs clear. Cardiac silhouette normal. Electronically Signed   By: Lowella Grip III M.D.   On: 06/24/2020 09:02   VAS [place] UPPER EXTREMITY VENOUS DUPLEX  Result Date: 06/22/2020 UPPER VENOUS STUDY  Patient Name:  Mario Peters  Date of Exam:   06/20/2020 Medical Rec #: [id number]          Accession #:    [id number] Date of Birth: 25-May-1965          Patient Gender: M Patient Age:   54Y Exam Location:  [facility] Procedure:      VAS [place] UPPER EXTREMITY VENOUS DUPLEX Referring Phys: [id number] [place] --------------------------------------------------------------------------------  Indications: IJ DVT noted during procedure Risk Factors: Cancer. Limitations:  Poor ultrasound/tissue interface, body habitus, bandages, line and patient positioning. Comparison Study: No prior studies. Performing Technologist: Oliver Hum RVT  Examination Guidelines: A complete evaluation includes B-mode imaging, spectral Doppler, color Doppler, and power Doppler as needed of all accessible portions of each vessel. Bilateral testing is considered an integral part of a complete examination. Limited examinations for reoccurring indications may be performed as noted.  Right Findings: +----------+------------+---------+-----------+----------+-----------------+ RIGHT     CompressiblePhasicitySpontaneousProperties     Summary      +----------+------------+---------+-----------+----------+-----------------+ IJV           None       No        No               Age Indeterminate +----------+------------+---------+-----------+----------+-----------------+ Subclavian    Full       Yes       Yes                                +----------+------------+---------+-----------+----------+-----------------+ Axillary      None       No  No               Age Indeterminate +----------+------------+---------+-----------+----------+-----------------+ Brachial      Full       Yes       Yes                                +----------+------------+---------+-----------+----------+-----------------+ Radial        Full                                                    +----------+------------+---------+-----------+----------+-----------------+ Ulnar         Full                                                    +----------+------------+---------+-----------+----------+-----------------+ Cephalic      Full                                                    +----------+------------+---------+-----------+----------+-----------------+ Basilic       Full                                                     +----------+------------+---------+-----------+----------+-----------------+  Left Findings: +----------+------------+---------+-----------+----------+-------+ LEFT      CompressiblePhasicitySpontaneousPropertiesSummary +----------+------------+---------+-----------+----------+-------+ IJV           None       No        No               Chronic +----------+------------+---------+-----------+----------+-------+ Subclavian    Full       Yes       Yes                      +----------+------------+---------+-----------+----------+-------+ Axillary      Full       Yes       Yes                      +----------+------------+---------+-----------+----------+-------+ Brachial      Full       Yes       Yes                      +----------+------------+---------+-----------+----------+-------+ Radial        Full                                          +----------+------------+---------+-----------+----------+-------+ Ulnar         Full                                          +----------+------------+---------+-----------+----------+-------+ Cephalic  Full                                          +----------+------------+---------+-----------+----------+-------+ Basilic       Full                                          +----------+------------+---------+-----------+----------+-------+ Venous flow is noted in the proximal IJ.  Summary:  Right: No evidence of superficial vein thrombosis in the upper extremity. Findings consistent with age indeterminate deep vein thrombosis involving the right internal jugular vein and right axillary vein.  Left: No evidence of superficial vein thrombosis in the upper extremity. 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  *See table(s) above for measurements and observations.  Diagnosing physician: Ruta Hinds MD Electronically signed by Ruta Hinds MD on 06/22/2020 at  12:01:43 PM.    Final     Labs reviewed, ok to proceed.   Mario Pike, MD 06/27/2020 1:24 PM

## 2020-06-27 NOTE — Assessment & Plan Note (Signed)
Insomnia likely situational from anxiety Will recommend ambien 5 mg po QHS for sleep. He says this helps him a few hrs  I think its his fear of not being able to breathe when he sleeps that has been affecting him Reassured him to try [place] for a few more days.

## 2020-06-28 ENCOUNTER — Other Ambulatory Visit (HOSPITAL_COMMUNITY): Payer: Self-pay

## 2020-06-28 ENCOUNTER — Other Ambulatory Visit: Payer: Self-pay

## 2020-06-28 ENCOUNTER — Ambulatory Visit
Admission: RE | Admit: 2020-06-28 | Discharge: 2020-06-28 | Disposition: A | Discharge status: Home / Self Care | Payer: Medicaid Other | Source: Ambulatory Visit | Attending: Radiation Oncology | Admitting: Radiation Oncology

## 2020-06-28 DIAGNOSIS — Z510 Encounter for antineoplastic radiation therapy: Secondary | ICD-10-CM | POA: Diagnosis not present

## 2020-07-01 ENCOUNTER — Ambulatory Visit: Payer: Medicaid Other | Admitting: Hematology and Oncology

## 2020-07-01 ENCOUNTER — Ambulatory Visit: Payer: Medicaid Other

## 2020-07-01 ENCOUNTER — Encounter: Payer: Medicaid Other | Admitting: Nutrition

## 2020-07-01 ENCOUNTER — Other Ambulatory Visit: Payer: Medicaid Other

## 2020-07-02 ENCOUNTER — Ambulatory Visit: Payer: Medicaid Other

## 2020-07-03 ENCOUNTER — Ambulatory Visit: Payer: Medicaid Other

## 2020-07-04 ENCOUNTER — Ambulatory Visit: Payer: Medicaid Other

## 2020-07-04 ENCOUNTER — Encounter: Payer: Medicaid Other | Admitting: Nutrition

## 2020-07-04 ENCOUNTER — Ambulatory Visit: Payer: Medicaid Other | Admitting: Hematology and Oncology

## 2020-07-04 ENCOUNTER — Other Ambulatory Visit: Payer: Medicaid Other

## 2020-07-05 ENCOUNTER — Ambulatory Visit: Payer: Medicaid Other

## 2020-07-08 ENCOUNTER — Other Ambulatory Visit: Payer: Medicaid Other

## 2020-07-08 ENCOUNTER — Encounter: Payer: Medicaid Other | Admitting: Nutrition

## 2020-07-08 ENCOUNTER — Ambulatory Visit: Payer: Medicaid Other

## 2020-07-09 ENCOUNTER — Ambulatory Visit: Payer: Medicaid Other

## 2020-07-10 ENCOUNTER — Ambulatory Visit: Payer: Medicaid Other

## 2020-07-11 ENCOUNTER — Ambulatory Visit: Payer: Medicaid Other

## 2020-07-11 ENCOUNTER — Other Ambulatory Visit: Payer: Medicaid Other

## 2020-07-11 ENCOUNTER — Ambulatory Visit: Payer: Medicaid Other | Admitting: Hematology and Oncology

## 2020-07-12 ENCOUNTER — Ambulatory Visit: Payer: Medicaid Other

## 2020-07-15 ENCOUNTER — Ambulatory Visit: Payer: Medicaid Other

## 2020-07-15 ENCOUNTER — Other Ambulatory Visit: Payer: Medicaid Other

## 2020-07-15 ENCOUNTER — Encounter: Payer: Medicaid Other | Admitting: Nutrition

## 2020-07-15 ENCOUNTER — Other Ambulatory Visit (HOSPITAL_COMMUNITY): Payer: Medicaid Other

## 2020-07-16 ENCOUNTER — Ambulatory Visit (HOSPITAL_COMMUNITY): Payer: Medicaid Other

## 2020-07-16 ENCOUNTER — Ambulatory Visit: Payer: Medicaid Other

## 2020-07-17 ENCOUNTER — Ambulatory Visit: Payer: Medicaid Other | Admitting: Nurse Practitioner

## 2020-07-17 ENCOUNTER — Ambulatory Visit: Payer: Medicaid Other

## 2020-07-17 ENCOUNTER — Other Ambulatory Visit: Payer: Medicaid Other

## 2020-07-18 ENCOUNTER — Ambulatory Visit: Payer: Medicaid Other

## 2020-07-18 ENCOUNTER — Encounter: Payer: Medicaid Other | Admitting: Nutrition

## 2020-07-18 ENCOUNTER — Other Ambulatory Visit: Payer: Medicaid Other

## 2020-07-19 ENCOUNTER — Ambulatory Visit: Payer: Medicaid Other

## 2020-07-19 DEATH — deceased

## 2020-07-23 ENCOUNTER — Ambulatory Visit: Payer: Medicaid Other

## 2020-07-24 ENCOUNTER — Ambulatory Visit: Payer: Medicaid Other

## 2020-07-24 ENCOUNTER — Ambulatory Visit: Payer: Medicaid Other | Admitting: Hematology and Oncology

## 2020-07-24 ENCOUNTER — Other Ambulatory Visit: Payer: Medicaid Other

## 2020-07-24 NOTE — Therapy (Addendum)
[facility] [address] [place], [place], [address] Phone: [phone]   Fax:  [phone]  Patient Details  Name: Mario Peters MRN: [id number] Date of Birth: Jul 04, 1965 Referring Provider:  Eppie Gibson, MD  Encounter Date: 07/24/2020   SPEECH THERAPY DISCHARGE SUMMARY  Visits from Start of Care: 1  Current functional level related to goals / functional outcomes: Pt was not seen for ST (except for eval) prior to his death. Goals derived in early 25-Jul-2022 were as follows:    SLP Short Term Goals - 06/20/20 1618                SLP SHORT TERM GOAL #1     Title pt will complete HEP with occasional min A     Time 1     Period --   visits, for all STGs     Status New           SLP SHORT TERM GOAL #2     Title pt will tell SLP why pt is completing HEP with modified independence     Time 1     Status New           SLP SHORT TERM GOAL #3     Title pt will describe 2 overt s/s aspiration PNA with modified independence     Time 2     Status New           SLP SHORT TERM GOAL #4     Title pt will tell SLP how a food journal could hasten return to a more normalized diet     Time 2     Status New                       SLP Long Term Goals - 06/20/20 1620                SLP LONG TERM GOAL #1     Title pt will complete HEP with rare min A     Time 4     Period --   visits, for all LTGS     Status New           SLP LONG TERM GOAL #2     Title pt will describe how to modify HEP over time, and the timeline associated with reduction in HEP frequency with modified independence over two sessions     Time 4     Status New           SLP LONG TERM GOAL #3     Title pt will tell SLP rationale for HEP in 3 sessions with rare min A     Time 4     Status New      Remaining deficits: SLP assumes all deficits remained   Education / Equipment: HEP procedure, late effects head/neck radiation on swallowing    Patient agrees to discharge. Patient goals were not met. Patient is being discharged due to not returning since the last visit..  (Pt expired)    [place] ,[place], [place]  07/24/2020, 12:46 AM  [facility] [address] [place], [place], [address] Phone: [phone]   Fax:  [phone]

## 2020-07-25 ENCOUNTER — Ambulatory Visit: Payer: Medicaid Other

## 2020-07-26 ENCOUNTER — Ambulatory Visit: Payer: Medicaid Other

## 2020-07-29 ENCOUNTER — Ambulatory Visit: Payer: Medicaid Other

## 2020-07-30 ENCOUNTER — Ambulatory Visit: Payer: Medicaid Other

## 2020-07-31 ENCOUNTER — Ambulatory Visit: Payer: Medicaid Other

## 2020-08-01 ENCOUNTER — Ambulatory Visit: Payer: Medicaid Other

## 2020-08-02 ENCOUNTER — Ambulatory Visit: Payer: Medicaid Other

## 2020-08-05 ENCOUNTER — Ambulatory Visit: Payer: Medicaid Other

## 2020-08-08 ENCOUNTER — Ambulatory Visit: Payer: Medicaid Other

## 2020-08-08 ENCOUNTER — Other Ambulatory Visit: Payer: Medicaid Other

## 2020-08-08 ENCOUNTER — Encounter: Payer: Medicaid Other | Admitting: Nutrition

## 2020-08-08 ENCOUNTER — Ambulatory Visit: Payer: Medicaid Other | Admitting: Hematology and Oncology

## 2020-08-26 ENCOUNTER — Encounter: Payer: Self-pay | Admitting: Physical Therapy

## 2021-08-22 IMAGING — US US ABDOMEN LIMITED RUQ/ASCITES
1 series · 14 of 25 positions shown · non-contrast
Comparison: None.

CLINICAL DATA: Alcohol abuse.

EXAM:
ULTRASOUND ABDOMEN LIMITED RIGHT UPPER QUADRANT

[Series 1: us abdomen limited ruq (liver/gb) · 14 of 51 slices shown]
[im 1/51]
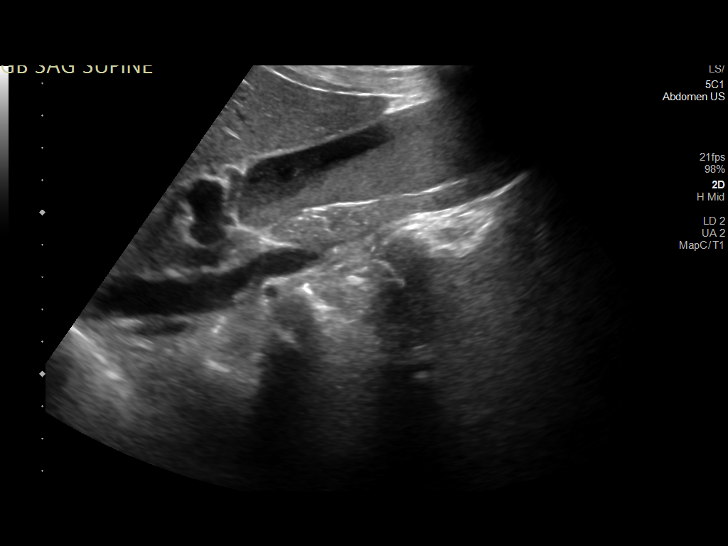
[im 5/51]
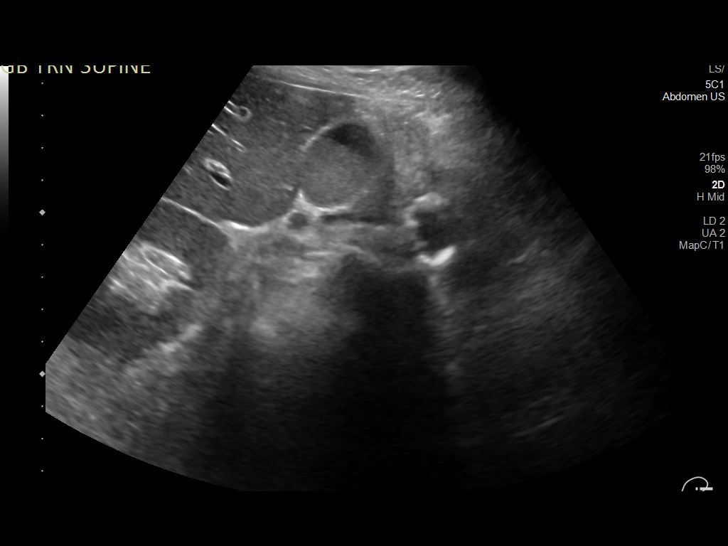
[im 9/51]
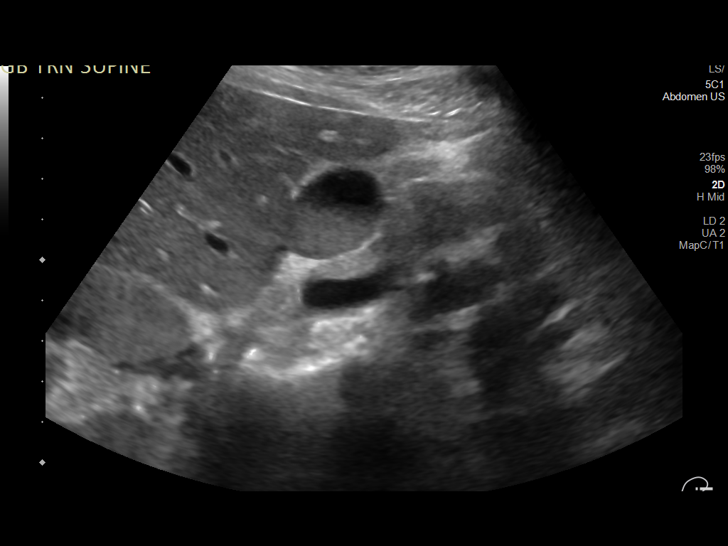
[im 13/51]
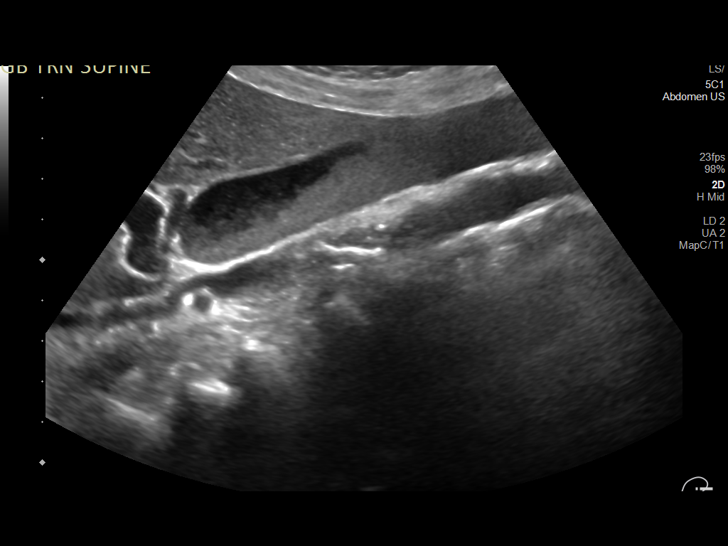
[im 17/51]
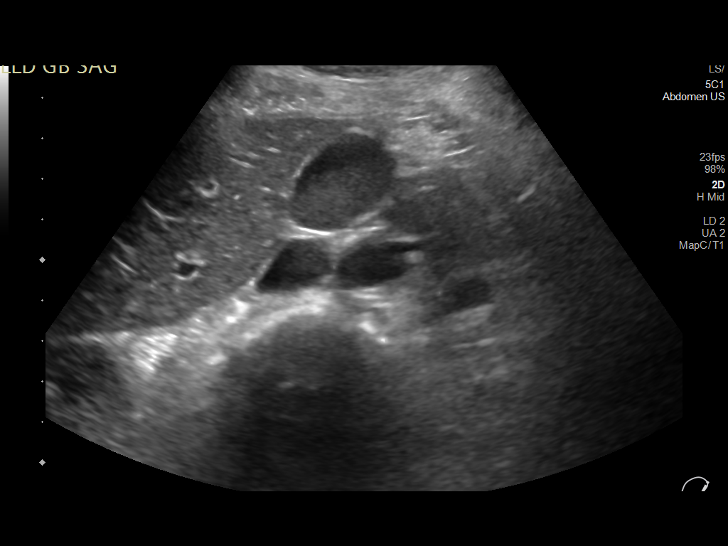
[im 19/51]
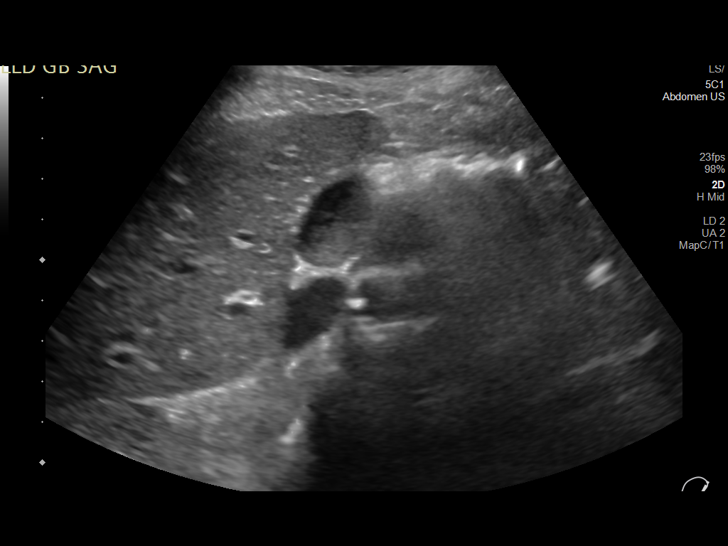
[im 23/51]
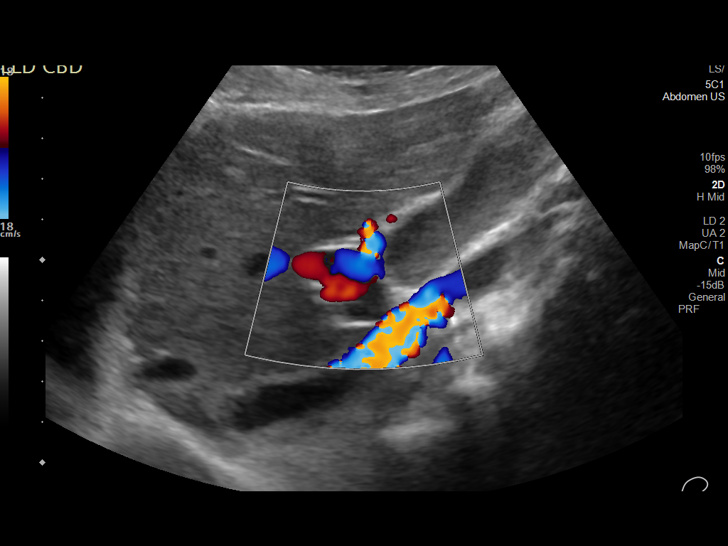
[im 28/51]
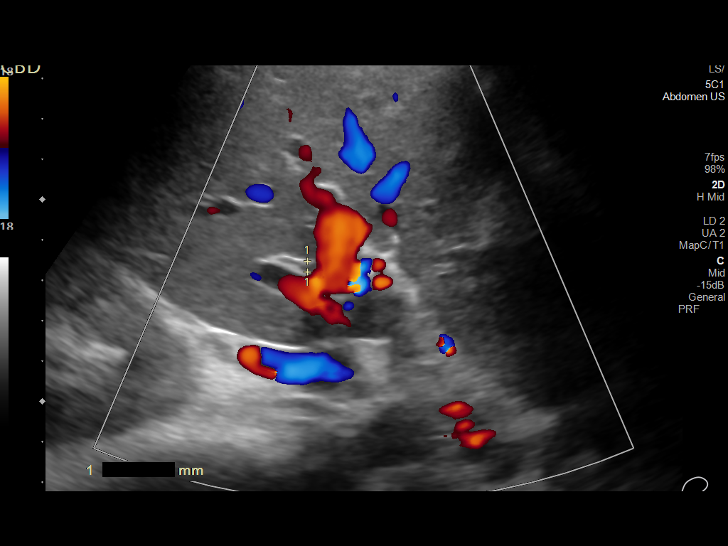
[im 32/51]
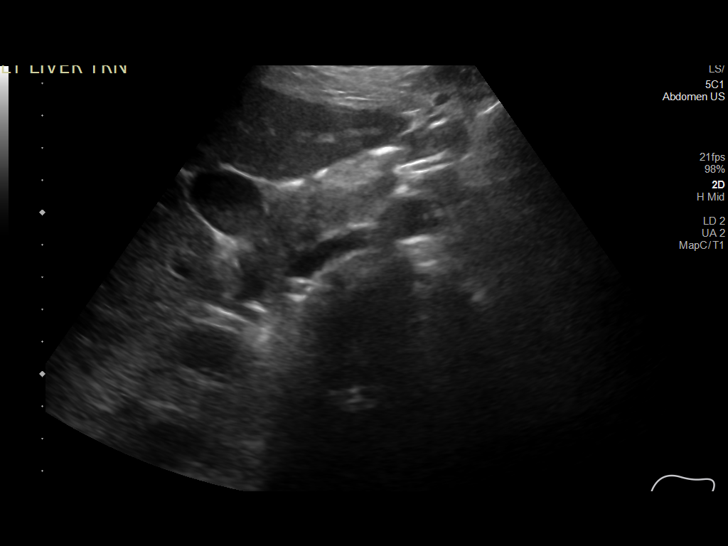
[im 34/51]
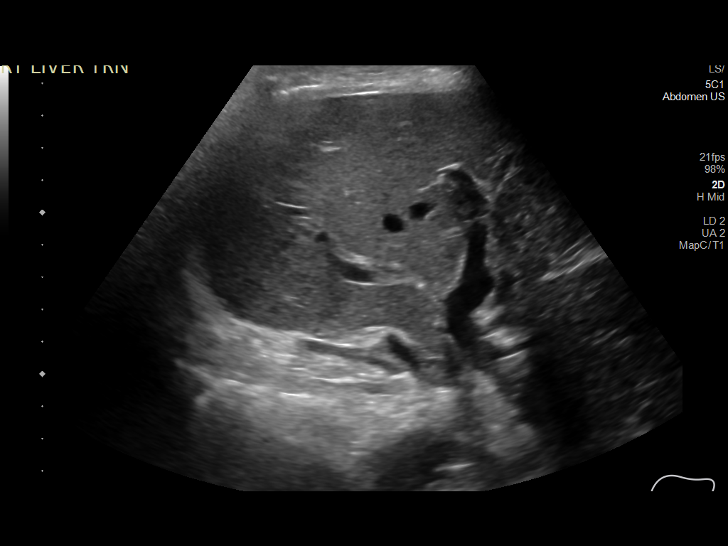
[im 38/51]
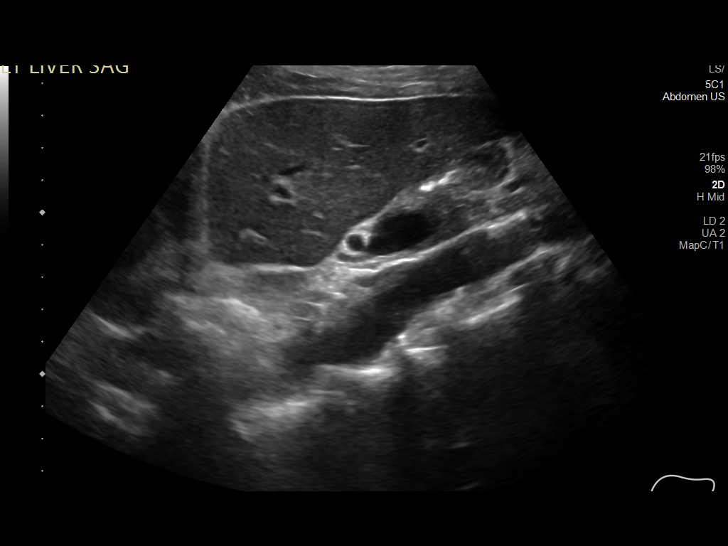
[im 42/51]
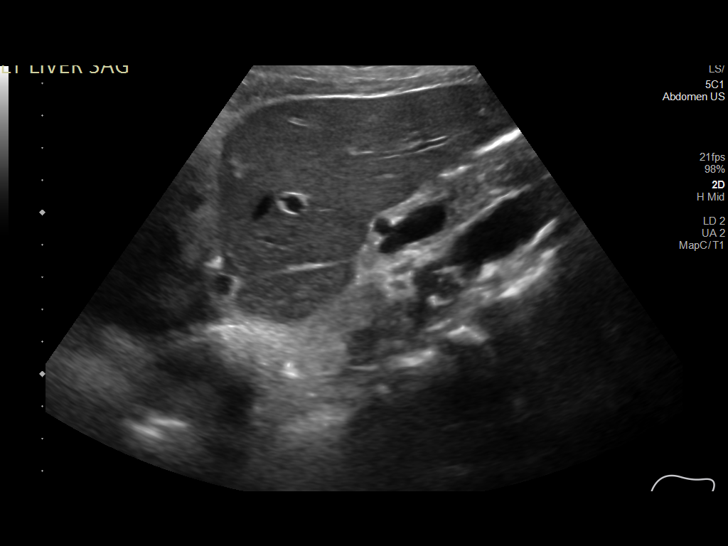
[im 46/51]
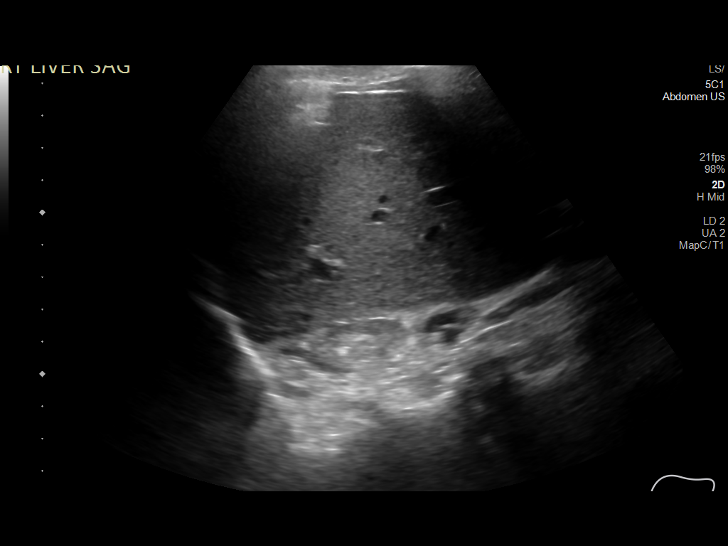
[im 51/51]
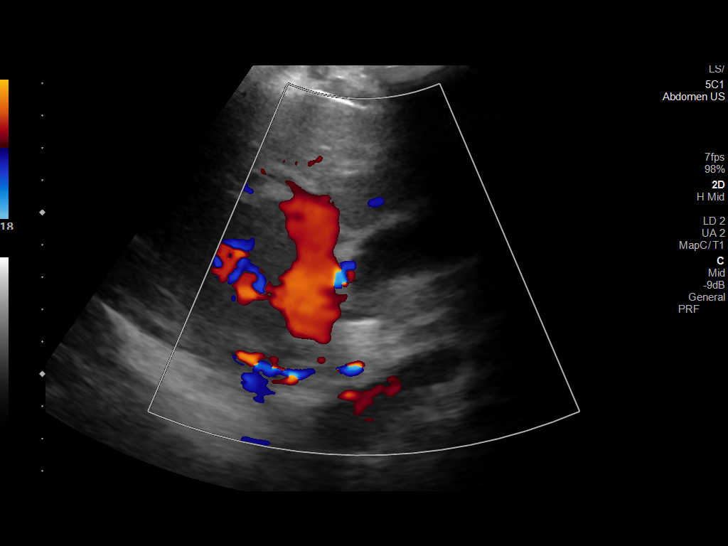

[14 of 25 positions shown; findings below may reference images not displayed]

FINDINGS: Gallbladder:

Gallbladder sludge fills the majority of the lumen of the
gallbladder. No gallstones or wall thickening visualized. No
pericholecystic fluid. No sonographic Murphy sign noted by
sonographer.

Common bile duct:

Diameter: 3 mm.

Liver:

No focal lesion identified. Within normal limits in parenchymal
echogenicity. Portal vein is patent on color Doppler imaging with
normal direction of blood flow towards the liver.

Other: None.
IMPRESSION: Gallbladder sludge fills the majority of the lumen of the
gallbladder. No associate findings of acute cholecystitis or
choledocholithiasis.

## 2021-08-26 IMAGING — DX DG CHEST 1V PORT
1 series · 1 of 1 positions shown · non-contrast
Comparison: Chest radiograph dated 04/17/2020 and CT dated
09/17/2020

CLINICAL DATA: 54-year-old male status post tracheostomy placement.

EXAM:
PORTABLE CHEST 1 VIEW

[chest ap]
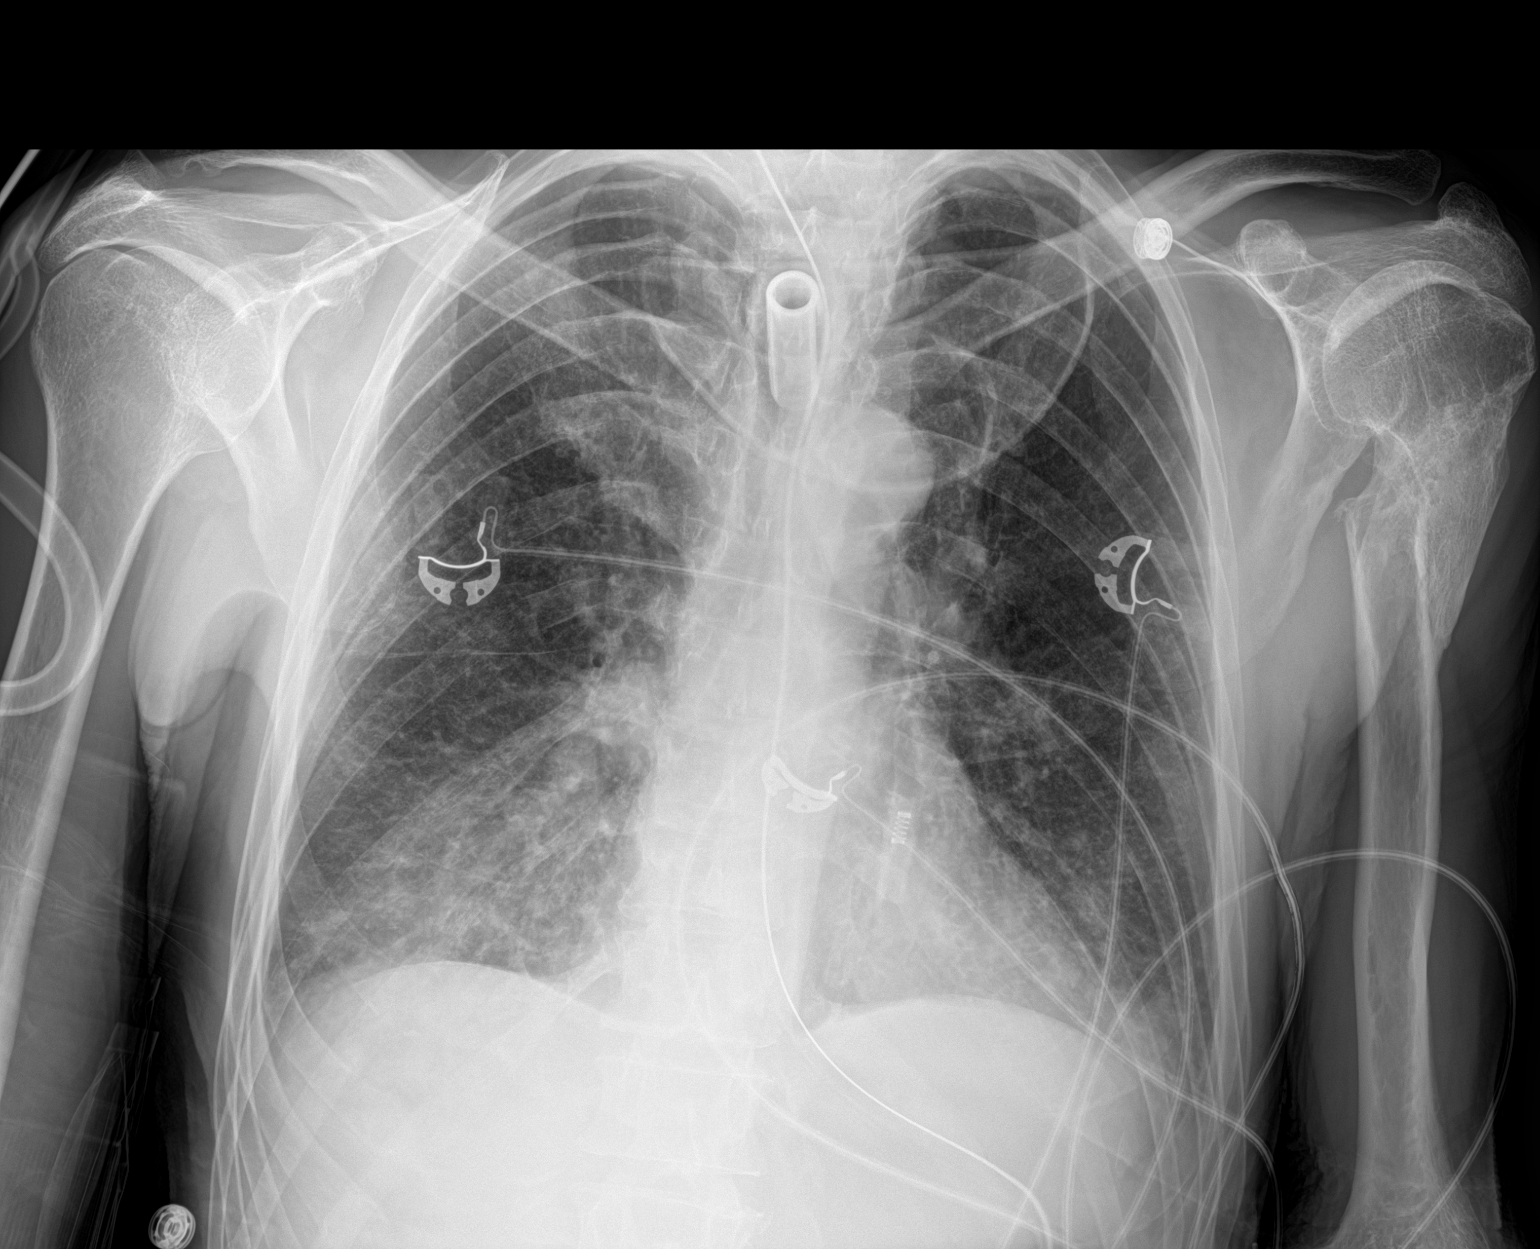

[1 of 1 positions shown; findings below may reference images not displayed]

FINDINGS: Tracheostomy with tip approximately 4.5 cm above the carina. Enteric
tube extends below the diaphragm with tip beyond the margin of the
image. Bilateral pulmonary nodularity primarily involving the lower
lung fields in keeping with known pneumonia. No significant pleural
effusion. No pneumothorax. No acute osseous pathology. Old healed
fracture deformity of the left humeral neck.
IMPRESSION: Tracheostomy above the carina.
